# Patient Record
Sex: Male | Born: 1935 | Race: White | Hispanic: No | Marital: Married | Smoking: Never smoker
Health system: Southern US, Community
[De-identification: ages and names within clinical notes are randomized; demographics above are authoritative.]

## PROBLEM LIST (undated history)

## (undated) DIAGNOSIS — K5790 Diverticulosis of intestine, part unspecified, without perforation or abscess without bleeding: Secondary | ICD-10-CM

## (undated) DIAGNOSIS — Z860100 Personal history of colon polyps, unspecified: Secondary | ICD-10-CM

## (undated) DIAGNOSIS — M545 Low back pain, unspecified: Secondary | ICD-10-CM

## (undated) DIAGNOSIS — M21371 Foot drop, right foot: Secondary | ICD-10-CM

## (undated) DIAGNOSIS — E119 Type 2 diabetes mellitus without complications: Secondary | ICD-10-CM

## (undated) DIAGNOSIS — I10 Essential (primary) hypertension: Secondary | ICD-10-CM

## (undated) DIAGNOSIS — C9110 Chronic lymphocytic leukemia of B-cell type not having achieved remission: Secondary | ICD-10-CM

## (undated) HISTORY — DX: Low back pain: M54.5

## (undated) HISTORY — PX: EYE SURGERY: SHX253

## (undated) HISTORY — PX: CHOLECYSTECTOMY: SHX55

## (undated) HISTORY — DX: Foot drop, right foot: M21.371

## (undated) HISTORY — PX: TONSILLECTOMY: SUR1361

## (undated) HISTORY — DX: Low back pain, unspecified: M54.50

---

## 2002-05-09 ENCOUNTER — Encounter: Admission: RE | Admit: 2002-05-09 | Discharge: 2002-05-09 | Payer: Self-pay | Admitting: Family Medicine

## 2002-05-09 ENCOUNTER — Encounter: Payer: Self-pay | Admitting: Family Medicine

## 2002-06-20 ENCOUNTER — Ambulatory Visit (HOSPITAL_COMMUNITY): Admission: RE | Admit: 2002-06-20 | Discharge: 2002-06-20 | Payer: Self-pay | Admitting: Gastroenterology

## 2002-06-22 ENCOUNTER — Ambulatory Visit (HOSPITAL_COMMUNITY): Admission: RE | Admit: 2002-06-22 | Discharge: 2002-06-22 | Payer: Self-pay | Admitting: Cardiology

## 2002-08-18 ENCOUNTER — Ambulatory Visit: Admission: RE | Admit: 2002-08-18 | Discharge: 2002-08-18 | Payer: Self-pay | Admitting: Pulmonary Disease

## 2004-05-27 ENCOUNTER — Encounter: Admission: RE | Admit: 2004-05-27 | Discharge: 2004-05-27 | Payer: Self-pay | Admitting: Family Medicine

## 2004-06-18 ENCOUNTER — Observation Stay (HOSPITAL_COMMUNITY): Admission: RE | Admit: 2004-06-18 | Discharge: 2004-06-19 | Payer: Self-pay

## 2005-01-11 ENCOUNTER — Encounter: Admission: RE | Admit: 2005-01-11 | Discharge: 2005-01-11 | Payer: Self-pay | Admitting: Family Medicine

## 2005-09-17 ENCOUNTER — Ambulatory Visit (HOSPITAL_COMMUNITY): Admission: RE | Admit: 2005-09-17 | Discharge: 2005-09-17 | Payer: Self-pay | Admitting: Gastroenterology

## 2010-09-23 ENCOUNTER — Other Ambulatory Visit: Payer: Self-pay | Admitting: Gastroenterology

## 2011-02-19 ENCOUNTER — Other Ambulatory Visit: Payer: Self-pay | Admitting: Family Medicine

## 2011-02-19 DIAGNOSIS — R609 Edema, unspecified: Secondary | ICD-10-CM

## 2011-02-20 ENCOUNTER — Ambulatory Visit
Admission: RE | Admit: 2011-02-20 | Discharge: 2011-02-20 | Disposition: A | Discharge status: Home / Self Care | Payer: Medicare Other | Source: Ambulatory Visit | Attending: Family Medicine | Admitting: Family Medicine

## 2011-02-20 DIAGNOSIS — R609 Edema, unspecified: Secondary | ICD-10-CM

## 2011-08-22 DIAGNOSIS — E1129 Type 2 diabetes mellitus with other diabetic kidney complication: Secondary | ICD-10-CM | POA: Diagnosis not present

## 2011-08-22 DIAGNOSIS — E1139 Type 2 diabetes mellitus with other diabetic ophthalmic complication: Secondary | ICD-10-CM | POA: Diagnosis not present

## 2011-08-22 DIAGNOSIS — R809 Proteinuria, unspecified: Secondary | ICD-10-CM | POA: Diagnosis not present

## 2011-08-22 DIAGNOSIS — E11359 Type 2 diabetes mellitus with proliferative diabetic retinopathy without macular edema: Secondary | ICD-10-CM | POA: Diagnosis not present

## 2011-08-22 DIAGNOSIS — E669 Obesity, unspecified: Secondary | ICD-10-CM | POA: Diagnosis not present

## 2011-08-22 DIAGNOSIS — E782 Mixed hyperlipidemia: Secondary | ICD-10-CM | POA: Diagnosis not present

## 2011-08-22 DIAGNOSIS — I10 Essential (primary) hypertension: Secondary | ICD-10-CM | POA: Diagnosis not present

## 2011-09-09 DIAGNOSIS — N185 Chronic kidney disease, stage 5: Secondary | ICD-10-CM | POA: Diagnosis not present

## 2011-09-25 DIAGNOSIS — Z79899 Other long term (current) drug therapy: Secondary | ICD-10-CM | POA: Diagnosis not present

## 2011-10-16 DIAGNOSIS — E1139 Type 2 diabetes mellitus with other diabetic ophthalmic complication: Secondary | ICD-10-CM | POA: Diagnosis not present

## 2011-10-16 DIAGNOSIS — E11349 Type 2 diabetes mellitus with severe nonproliferative diabetic retinopathy without macular edema: Secondary | ICD-10-CM | POA: Diagnosis not present

## 2011-10-16 DIAGNOSIS — H35049 Retinal micro-aneurysms, unspecified, unspecified eye: Secondary | ICD-10-CM | POA: Diagnosis not present

## 2011-10-16 DIAGNOSIS — H43819 Vitreous degeneration, unspecified eye: Secondary | ICD-10-CM | POA: Diagnosis not present

## 2012-02-26 DIAGNOSIS — R609 Edema, unspecified: Secondary | ICD-10-CM | POA: Diagnosis not present

## 2012-02-26 DIAGNOSIS — I8310 Varicose veins of unspecified lower extremity with inflammation: Secondary | ICD-10-CM | POA: Diagnosis not present

## 2012-03-19 DIAGNOSIS — E1129 Type 2 diabetes mellitus with other diabetic kidney complication: Secondary | ICD-10-CM | POA: Diagnosis not present

## 2012-03-19 DIAGNOSIS — E1139 Type 2 diabetes mellitus with other diabetic ophthalmic complication: Secondary | ICD-10-CM | POA: Diagnosis not present

## 2012-03-19 DIAGNOSIS — E11359 Type 2 diabetes mellitus with proliferative diabetic retinopathy without macular edema: Secondary | ICD-10-CM | POA: Diagnosis not present

## 2012-03-19 DIAGNOSIS — E782 Mixed hyperlipidemia: Secondary | ICD-10-CM | POA: Diagnosis not present

## 2012-03-19 DIAGNOSIS — R609 Edema, unspecified: Secondary | ICD-10-CM | POA: Diagnosis not present

## 2012-03-19 DIAGNOSIS — I10 Essential (primary) hypertension: Secondary | ICD-10-CM | POA: Diagnosis not present

## 2012-03-19 DIAGNOSIS — R809 Proteinuria, unspecified: Secondary | ICD-10-CM | POA: Diagnosis not present

## 2012-03-19 DIAGNOSIS — E669 Obesity, unspecified: Secondary | ICD-10-CM | POA: Diagnosis not present

## 2012-03-19 DIAGNOSIS — E1165 Type 2 diabetes mellitus with hyperglycemia: Secondary | ICD-10-CM | POA: Diagnosis not present

## 2012-04-15 DIAGNOSIS — E1139 Type 2 diabetes mellitus with other diabetic ophthalmic complication: Secondary | ICD-10-CM | POA: Diagnosis not present

## 2012-04-15 DIAGNOSIS — E11349 Type 2 diabetes mellitus with severe nonproliferative diabetic retinopathy without macular edema: Secondary | ICD-10-CM | POA: Diagnosis not present

## 2012-04-15 DIAGNOSIS — H35049 Retinal micro-aneurysms, unspecified, unspecified eye: Secondary | ICD-10-CM | POA: Diagnosis not present

## 2012-07-16 DIAGNOSIS — Z23 Encounter for immunization: Secondary | ICD-10-CM | POA: Diagnosis not present

## 2012-09-20 DIAGNOSIS — R809 Proteinuria, unspecified: Secondary | ICD-10-CM | POA: Diagnosis not present

## 2012-09-30 ENCOUNTER — Other Ambulatory Visit: Payer: Self-pay

## 2012-10-21 DIAGNOSIS — H43819 Vitreous degeneration, unspecified eye: Secondary | ICD-10-CM | POA: Diagnosis not present

## 2012-10-21 DIAGNOSIS — E11349 Type 2 diabetes mellitus with severe nonproliferative diabetic retinopathy without macular edema: Secondary | ICD-10-CM | POA: Diagnosis not present

## 2012-10-21 DIAGNOSIS — E1139 Type 2 diabetes mellitus with other diabetic ophthalmic complication: Secondary | ICD-10-CM | POA: Diagnosis not present

## 2012-10-21 DIAGNOSIS — H35049 Retinal micro-aneurysms, unspecified, unspecified eye: Secondary | ICD-10-CM | POA: Diagnosis not present

## 2013-04-26 DIAGNOSIS — E11349 Type 2 diabetes mellitus with severe nonproliferative diabetic retinopathy without macular edema: Secondary | ICD-10-CM | POA: Diagnosis not present

## 2013-04-26 DIAGNOSIS — E1139 Type 2 diabetes mellitus with other diabetic ophthalmic complication: Secondary | ICD-10-CM | POA: Diagnosis not present

## 2013-04-26 DIAGNOSIS — H43819 Vitreous degeneration, unspecified eye: Secondary | ICD-10-CM | POA: Diagnosis not present

## 2013-04-26 DIAGNOSIS — H35359 Cystoid macular degeneration, unspecified eye: Secondary | ICD-10-CM | POA: Diagnosis not present

## 2013-05-05 DIAGNOSIS — Z23 Encounter for immunization: Secondary | ICD-10-CM | POA: Diagnosis not present

## 2013-07-27 DIAGNOSIS — H6120 Impacted cerumen, unspecified ear: Secondary | ICD-10-CM | POA: Diagnosis not present

## 2013-07-27 DIAGNOSIS — I1310 Hypertensive heart and chronic kidney disease without heart failure, with stage 1 through stage 4 chronic kidney disease, or unspecified chronic kidney disease: Secondary | ICD-10-CM | POA: Diagnosis not present

## 2013-07-27 DIAGNOSIS — M1990 Unspecified osteoarthritis, unspecified site: Secondary | ICD-10-CM | POA: Diagnosis not present

## 2013-07-27 DIAGNOSIS — Z0000 Encounter for general adult medical examination without abnormal findings: Secondary | ICD-10-CM | POA: Diagnosis not present

## 2013-07-27 DIAGNOSIS — E1129 Type 2 diabetes mellitus with other diabetic kidney complication: Secondary | ICD-10-CM | POA: Diagnosis not present

## 2013-07-27 DIAGNOSIS — I2510 Atherosclerotic heart disease of native coronary artery without angina pectoris: Secondary | ICD-10-CM | POA: Diagnosis not present

## 2013-07-27 DIAGNOSIS — E11359 Type 2 diabetes mellitus with proliferative diabetic retinopathy without macular edema: Secondary | ICD-10-CM | POA: Diagnosis not present

## 2013-07-27 DIAGNOSIS — E1139 Type 2 diabetes mellitus with other diabetic ophthalmic complication: Secondary | ICD-10-CM | POA: Diagnosis not present

## 2013-10-24 DIAGNOSIS — E1139 Type 2 diabetes mellitus with other diabetic ophthalmic complication: Secondary | ICD-10-CM | POA: Diagnosis not present

## 2013-10-24 DIAGNOSIS — E11349 Type 2 diabetes mellitus with severe nonproliferative diabetic retinopathy without macular edema: Secondary | ICD-10-CM | POA: Diagnosis not present

## 2014-02-07 DIAGNOSIS — N183 Chronic kidney disease, stage 3 unspecified: Secondary | ICD-10-CM | POA: Diagnosis not present

## 2014-02-07 DIAGNOSIS — E781 Pure hyperglyceridemia: Secondary | ICD-10-CM | POA: Diagnosis not present

## 2014-02-07 DIAGNOSIS — E1139 Type 2 diabetes mellitus with other diabetic ophthalmic complication: Secondary | ICD-10-CM | POA: Diagnosis not present

## 2014-02-07 DIAGNOSIS — E669 Obesity, unspecified: Secondary | ICD-10-CM | POA: Diagnosis not present

## 2014-02-07 DIAGNOSIS — Z23 Encounter for immunization: Secondary | ICD-10-CM | POA: Diagnosis not present

## 2014-02-07 DIAGNOSIS — I1310 Hypertensive heart and chronic kidney disease without heart failure, with stage 1 through stage 4 chronic kidney disease, or unspecified chronic kidney disease: Secondary | ICD-10-CM | POA: Diagnosis not present

## 2014-02-07 DIAGNOSIS — E11359 Type 2 diabetes mellitus with proliferative diabetic retinopathy without macular edema: Secondary | ICD-10-CM | POA: Diagnosis not present

## 2014-02-07 DIAGNOSIS — I8310 Varicose veins of unspecified lower extremity with inflammation: Secondary | ICD-10-CM | POA: Diagnosis not present

## 2014-02-07 DIAGNOSIS — E1129 Type 2 diabetes mellitus with other diabetic kidney complication: Secondary | ICD-10-CM | POA: Diagnosis not present

## 2014-02-07 DIAGNOSIS — E1165 Type 2 diabetes mellitus with hyperglycemia: Secondary | ICD-10-CM | POA: Diagnosis not present

## 2014-04-11 DIAGNOSIS — Z23 Encounter for immunization: Secondary | ICD-10-CM | POA: Diagnosis not present

## 2014-04-18 DIAGNOSIS — E11349 Type 2 diabetes mellitus with severe nonproliferative diabetic retinopathy without macular edema: Secondary | ICD-10-CM | POA: Diagnosis not present

## 2014-04-18 DIAGNOSIS — E1139 Type 2 diabetes mellitus with other diabetic ophthalmic complication: Secondary | ICD-10-CM | POA: Diagnosis not present

## 2014-04-18 DIAGNOSIS — H35359 Cystoid macular degeneration, unspecified eye: Secondary | ICD-10-CM | POA: Diagnosis not present

## 2014-04-30 DIAGNOSIS — E162 Hypoglycemia, unspecified: Secondary | ICD-10-CM | POA: Diagnosis not present

## 2014-08-09 DIAGNOSIS — I8310 Varicose veins of unspecified lower extremity with inflammation: Secondary | ICD-10-CM | POA: Diagnosis not present

## 2014-08-09 DIAGNOSIS — D649 Anemia, unspecified: Secondary | ICD-10-CM | POA: Diagnosis not present

## 2014-08-09 DIAGNOSIS — I25119 Atherosclerotic heart disease of native coronary artery with unspecified angina pectoris: Secondary | ICD-10-CM | POA: Diagnosis not present

## 2014-08-09 DIAGNOSIS — E11319 Type 2 diabetes mellitus with unspecified diabetic retinopathy without macular edema: Secondary | ICD-10-CM | POA: Diagnosis not present

## 2014-08-09 DIAGNOSIS — Z0001 Encounter for general adult medical examination with abnormal findings: Secondary | ICD-10-CM | POA: Diagnosis not present

## 2014-08-09 DIAGNOSIS — I1310 Hypertensive heart and chronic kidney disease without heart failure, with stage 1 through stage 4 chronic kidney disease, or unspecified chronic kidney disease: Secondary | ICD-10-CM | POA: Diagnosis not present

## 2014-08-09 DIAGNOSIS — E782 Mixed hyperlipidemia: Secondary | ICD-10-CM | POA: Diagnosis not present

## 2014-08-09 DIAGNOSIS — J301 Allergic rhinitis due to pollen: Secondary | ICD-10-CM | POA: Diagnosis not present

## 2014-08-09 DIAGNOSIS — Z1389 Encounter for screening for other disorder: Secondary | ICD-10-CM | POA: Diagnosis not present

## 2014-08-09 DIAGNOSIS — E1122 Type 2 diabetes mellitus with diabetic chronic kidney disease: Secondary | ICD-10-CM | POA: Diagnosis not present

## 2014-08-09 DIAGNOSIS — N183 Chronic kidney disease, stage 3 (moderate): Secondary | ICD-10-CM | POA: Diagnosis not present

## 2014-08-09 DIAGNOSIS — K219 Gastro-esophageal reflux disease without esophagitis: Secondary | ICD-10-CM | POA: Diagnosis not present

## 2015-01-16 DIAGNOSIS — E11349 Type 2 diabetes mellitus with severe nonproliferative diabetic retinopathy without macular edema: Secondary | ICD-10-CM | POA: Diagnosis not present

## 2015-01-16 DIAGNOSIS — H43813 Vitreous degeneration, bilateral: Secondary | ICD-10-CM | POA: Diagnosis not present

## 2015-02-09 DIAGNOSIS — I25119 Atherosclerotic heart disease of native coronary artery with unspecified angina pectoris: Secondary | ICD-10-CM | POA: Diagnosis not present

## 2015-02-09 DIAGNOSIS — E11319 Type 2 diabetes mellitus with unspecified diabetic retinopathy without macular edema: Secondary | ICD-10-CM | POA: Diagnosis not present

## 2015-02-09 DIAGNOSIS — E669 Obesity, unspecified: Secondary | ICD-10-CM | POA: Diagnosis not present

## 2015-02-09 DIAGNOSIS — E1122 Type 2 diabetes mellitus with diabetic chronic kidney disease: Secondary | ICD-10-CM | POA: Diagnosis not present

## 2015-02-09 DIAGNOSIS — N183 Chronic kidney disease, stage 3 (moderate): Secondary | ICD-10-CM | POA: Diagnosis not present

## 2015-02-09 DIAGNOSIS — Z6831 Body mass index (BMI) 31.0-31.9, adult: Secondary | ICD-10-CM | POA: Diagnosis not present

## 2015-02-09 DIAGNOSIS — I1310 Hypertensive heart and chronic kidney disease without heart failure, with stage 1 through stage 4 chronic kidney disease, or unspecified chronic kidney disease: Secondary | ICD-10-CM | POA: Diagnosis not present

## 2015-02-09 DIAGNOSIS — Z794 Long term (current) use of insulin: Secondary | ICD-10-CM | POA: Diagnosis not present

## 2015-02-09 DIAGNOSIS — E782 Mixed hyperlipidemia: Secondary | ICD-10-CM | POA: Diagnosis not present

## 2015-02-16 DIAGNOSIS — N179 Acute kidney failure, unspecified: Secondary | ICD-10-CM | POA: Diagnosis not present

## 2015-02-19 DIAGNOSIS — H04123 Dry eye syndrome of bilateral lacrimal glands: Secondary | ICD-10-CM | POA: Diagnosis not present

## 2015-02-19 DIAGNOSIS — H01001 Unspecified blepharitis right upper eyelid: Secondary | ICD-10-CM | POA: Diagnosis not present

## 2015-02-19 DIAGNOSIS — E11339 Type 2 diabetes mellitus with moderate nonproliferative diabetic retinopathy without macular edema: Secondary | ICD-10-CM | POA: Diagnosis not present

## 2015-02-19 DIAGNOSIS — H43813 Vitreous degeneration, bilateral: Secondary | ICD-10-CM | POA: Diagnosis not present

## 2015-02-27 DIAGNOSIS — N179 Acute kidney failure, unspecified: Secondary | ICD-10-CM | POA: Diagnosis not present

## 2015-04-09 DIAGNOSIS — Z23 Encounter for immunization: Secondary | ICD-10-CM | POA: Diagnosis not present

## 2015-09-19 DIAGNOSIS — E782 Mixed hyperlipidemia: Secondary | ICD-10-CM | POA: Diagnosis not present

## 2015-09-19 DIAGNOSIS — E669 Obesity, unspecified: Secondary | ICD-10-CM | POA: Diagnosis not present

## 2015-09-19 DIAGNOSIS — D649 Anemia, unspecified: Secondary | ICD-10-CM | POA: Diagnosis not present

## 2015-09-19 DIAGNOSIS — I1310 Hypertensive heart and chronic kidney disease without heart failure, with stage 1 through stage 4 chronic kidney disease, or unspecified chronic kidney disease: Secondary | ICD-10-CM | POA: Diagnosis not present

## 2015-09-19 DIAGNOSIS — R609 Edema, unspecified: Secondary | ICD-10-CM | POA: Diagnosis not present

## 2015-09-19 DIAGNOSIS — N183 Chronic kidney disease, stage 3 (moderate): Secondary | ICD-10-CM | POA: Diagnosis not present

## 2015-09-19 DIAGNOSIS — I25119 Atherosclerotic heart disease of native coronary artery with unspecified angina pectoris: Secondary | ICD-10-CM | POA: Diagnosis not present

## 2015-09-19 DIAGNOSIS — Z0001 Encounter for general adult medical examination with abnormal findings: Secondary | ICD-10-CM | POA: Diagnosis not present

## 2015-09-19 DIAGNOSIS — I8310 Varicose veins of unspecified lower extremity with inflammation: Secondary | ICD-10-CM | POA: Diagnosis not present

## 2015-09-19 DIAGNOSIS — E1122 Type 2 diabetes mellitus with diabetic chronic kidney disease: Secondary | ICD-10-CM | POA: Diagnosis not present

## 2015-09-19 DIAGNOSIS — K449 Diaphragmatic hernia without obstruction or gangrene: Secondary | ICD-10-CM | POA: Diagnosis not present

## 2015-09-19 DIAGNOSIS — E11319 Type 2 diabetes mellitus with unspecified diabetic retinopathy without macular edema: Secondary | ICD-10-CM | POA: Diagnosis not present

## 2015-09-26 DEATH — deceased

## 2015-10-03 DIAGNOSIS — E1122 Type 2 diabetes mellitus with diabetic chronic kidney disease: Secondary | ICD-10-CM | POA: Diagnosis not present

## 2015-10-03 DIAGNOSIS — Z7984 Long term (current) use of oral hypoglycemic drugs: Secondary | ICD-10-CM | POA: Diagnosis not present

## 2015-10-16 DIAGNOSIS — H35352 Cystoid macular degeneration, left eye: Secondary | ICD-10-CM | POA: Diagnosis not present

## 2015-10-16 DIAGNOSIS — E113391 Type 2 diabetes mellitus with moderate nonproliferative diabetic retinopathy without macular edema, right eye: Secondary | ICD-10-CM | POA: Diagnosis not present

## 2015-10-16 DIAGNOSIS — E113492 Type 2 diabetes mellitus with severe nonproliferative diabetic retinopathy without macular edema, left eye: Secondary | ICD-10-CM | POA: Diagnosis not present

## 2015-10-16 DIAGNOSIS — H35043 Retinal micro-aneurysms, unspecified, bilateral: Secondary | ICD-10-CM | POA: Diagnosis not present

## 2015-10-16 DIAGNOSIS — H3563 Retinal hemorrhage, bilateral: Secondary | ICD-10-CM | POA: Diagnosis not present

## 2015-10-16 DIAGNOSIS — H43811 Vitreous degeneration, right eye: Secondary | ICD-10-CM | POA: Diagnosis not present

## 2015-10-31 ENCOUNTER — Other Ambulatory Visit: Payer: Self-pay | Admitting: Gastroenterology

## 2015-11-01 DIAGNOSIS — Z7984 Long term (current) use of oral hypoglycemic drugs: Secondary | ICD-10-CM | POA: Diagnosis not present

## 2015-11-01 DIAGNOSIS — E1122 Type 2 diabetes mellitus with diabetic chronic kidney disease: Secondary | ICD-10-CM | POA: Diagnosis not present

## 2015-12-03 ENCOUNTER — Encounter (HOSPITAL_COMMUNITY): Payer: Self-pay | Admitting: *Deleted

## 2015-12-10 ENCOUNTER — Encounter (HOSPITAL_COMMUNITY): Payer: Self-pay | Admitting: Anesthesiology

## 2015-12-10 ENCOUNTER — Ambulatory Visit (HOSPITAL_COMMUNITY)
Admission: RE | Admit: 2015-12-10 | Discharge: 2015-12-10 | Disposition: A | Discharge status: Home / Self Care | Payer: Medicare Other | Source: Ambulatory Visit | Attending: Gastroenterology | Admitting: Gastroenterology

## 2015-12-10 ENCOUNTER — Encounter (HOSPITAL_COMMUNITY)
Admission: RE | Disposition: A | Discharge status: Home / Self Care | Payer: Self-pay | Source: Ambulatory Visit | Attending: Gastroenterology

## 2015-12-10 ENCOUNTER — Ambulatory Visit (HOSPITAL_COMMUNITY): Payer: Medicare Other | Admitting: Anesthesiology

## 2015-12-10 DIAGNOSIS — Z1211 Encounter for screening for malignant neoplasm of colon: Secondary | ICD-10-CM | POA: Insufficient documentation

## 2015-12-10 DIAGNOSIS — Z794 Long term (current) use of insulin: Secondary | ICD-10-CM | POA: Diagnosis not present

## 2015-12-10 DIAGNOSIS — I2510 Atherosclerotic heart disease of native coronary artery without angina pectoris: Secondary | ICD-10-CM | POA: Insufficient documentation

## 2015-12-10 DIAGNOSIS — E11319 Type 2 diabetes mellitus with unspecified diabetic retinopathy without macular edema: Secondary | ICD-10-CM | POA: Diagnosis not present

## 2015-12-10 DIAGNOSIS — K635 Polyp of colon: Secondary | ICD-10-CM | POA: Diagnosis not present

## 2015-12-10 DIAGNOSIS — Z86010 Personal history of colonic polyps: Secondary | ICD-10-CM | POA: Insufficient documentation

## 2015-12-10 DIAGNOSIS — D120 Benign neoplasm of cecum: Secondary | ICD-10-CM | POA: Diagnosis not present

## 2015-12-10 DIAGNOSIS — Z8719 Personal history of other diseases of the digestive system: Secondary | ICD-10-CM | POA: Diagnosis not present

## 2015-12-10 DIAGNOSIS — I10 Essential (primary) hypertension: Secondary | ICD-10-CM | POA: Insufficient documentation

## 2015-12-10 HISTORY — DX: Diverticulosis of intestine, part unspecified, without perforation or abscess without bleeding: K57.90

## 2015-12-10 HISTORY — DX: Type 2 diabetes mellitus without complications: E11.9

## 2015-12-10 HISTORY — DX: Essential (primary) hypertension: I10

## 2015-12-10 HISTORY — DX: Personal history of colonic polyps: Z86.010

## 2015-12-10 HISTORY — DX: Personal history of colon polyps, unspecified: Z86.0100

## 2015-12-10 HISTORY — PX: COLONOSCOPY WITH PROPOFOL: SHX5780

## 2015-12-10 LAB — GLUCOSE, CAPILLARY: GLUCOSE-CAPILLARY: 230 mg/dL — AB (ref 65–99)

## 2015-12-10 SURGERY — COLONOSCOPY WITH PROPOFOL
Anesthesia: Monitor Anesthesia Care

## 2015-12-10 MED ORDER — PROPOFOL 10 MG/ML IV BOLUS
INTRAVENOUS | Status: AC
  Filled 2015-12-10: qty 20

## 2015-12-10 MED ORDER — SODIUM CHLORIDE 0.9 % IV SOLN: INTRAVENOUS | Status: DC

## 2015-12-10 MED ORDER — LACTATED RINGERS IV SOLN
INTRAVENOUS | Status: DC
  Administered 2015-12-10: 08:00:00 via INTRAVENOUS
  Administered 2015-12-10: 1000 mL via INTRAVENOUS

## 2015-12-10 MED ORDER — PROPOFOL 500 MG/50ML IV EMUL
INTRAVENOUS | Status: DC | PRN
  Administered 2015-12-10: 50 mg via INTRAVENOUS

## 2015-12-10 MED ORDER — PROPOFOL 500 MG/50ML IV EMUL
INTRAVENOUS | Status: DC | PRN
  Administered 2015-12-10: 150 ug/kg/min via INTRAVENOUS

## 2015-12-10 SURGICAL SUPPLY — 21 items

## 2015-12-10 NOTE — Discharge Instructions (Signed)

## 2015-12-10 NOTE — Anesthesia Preprocedure Evaluation (Addendum)
Anesthesia Evaluation  Patient identified by MRN, date of birth, ID band Patient awake    Airway Mallampati: II  TM Distance: >3 FB Neck ROM: Full    Dental  (+) Teeth Intact   Pulmonary neg pulmonary ROS,    breath sounds clear to auscultation       Cardiovascular hypertension,  Rhythm:Regular Rate:Normal     Neuro/Psych negative neurological ROS     GI/Hepatic negative GI ROS, Neg liver ROS,   Endo/Other  diabetes, Type 1, Insulin Dependent  Renal/GU negative Renal ROS     Musculoskeletal negative musculoskeletal ROS (+)   Abdominal   Peds  Hematology   Anesthesia Other Findings   Reproductive/Obstetrics                            Anesthesia Physical Anesthesia Plan  ASA: III  Anesthesia Plan: MAC   Post-op Pain Management:    Induction: Intravenous  Airway Management Planned: Natural Airway and Simple Face Mask  Additional Equipment:   Intra-op Plan:   Post-operative Plan:   Informed Consent: I have reviewed the patients History and Physical, chart, labs and discussed the procedure including the risks, benefits and alternatives for the proposed anesthesia with the patient or authorized representative who has indicated his/her understanding and acceptance.     Plan Discussed with: CRNA and Surgeon  Anesthesia Plan Comments:         Anesthesia Quick Evaluation

## 2015-12-10 NOTE — Anesthesia Postprocedure Evaluation (Signed)
Anesthesia Post Note  Patient: Benjamin Watkins  Procedure(s) Performed: Procedure(s) (LRB): COLONOSCOPY WITH PROPOFOL (N/A)  Patient location during evaluation: Endoscopy Anesthesia Type: MAC Level of consciousness: awake and alert Pain management: pain level controlled Vital Signs Assessment: post-procedure vital signs reviewed and stable Respiratory status: spontaneous breathing, nonlabored ventilation, respiratory function stable and patient connected to nasal cannula oxygen Cardiovascular status: stable and blood pressure returned to baseline Anesthetic complications: no    Last Vitals:  Filed Vitals:   12/10/15 0855 12/10/15 0900  BP:  155/62  Pulse: 64 65  Temp:    Resp: 18 22    Last Pain: There were no vitals filed for this visit.               Kennth Vanbenschoten,JAMES TERRILL

## 2015-12-10 NOTE — H&P (Signed)
  Procedure: Surveillance colonoscopy. 09/23/2010 surveillance colonoscopy was performed with removal of three tubular adenomatous colon polyps. A 5 mm rectal polyp, a 10 mm cecal polyp, and a 5 mm cecal polyp.  History: The patient is an 82 year old male born 1933/01/20. He is scheduled to undergo a surveillance colonoscopy today.  Past medical history: Type 2 diabetes mellitus with proteinuria and diabetic retinopathy. Hypertension. Hypercholesterolemia. Gastroesophageal reflux. Aortic sclerosis. Coronary artery disease. Tonsillectomy. Cataract surgery.  Exam: The patient is alert and lying comfortably on the endoscopy stretcher. Abdomen is soft and nontender to palpation. Lungs are clear to auscultation. Cardiac exam reveals a regular rhythm.  Plan: Proceed with surveillance colonoscopy

## 2015-12-10 NOTE — Op Note (Signed)
[facility] Patient Name: Benjamin Watkins Procedure Date: 12/10/2015 MRN: [id number] Attending MD: Garlan Fair , MD Date of Birth: 06-Feb-1933 CSN: [id number] Age: 82 Admit Type: Inpatient Procedure:                Colonoscopy Indications:              High risk colon cancer surveillance: Personal                            history of adenoma (10 mm or greater in size) Providers:                Garlan Fair, MD, Vista Lawman, RN, Cherylynn Ridges, Technician, Arnoldo Hooker, CRNA Referring MD:              Medicines:                Propofol per Anesthesia Complications:            No immediate complications. Estimated Blood Loss:     Estimated blood loss: none. Procedure:                Pre-Anesthesia Assessment:                           - Prior to the procedure, a History and Physical                            was performed, and patient medications and                            allergies were reviewed. The patient's tolerance of                            previous anesthesia was also reviewed. The risks                            and benefits of the procedure and the sedation                            options and risks were discussed with the patient.                            All questions were answered, and informed consent                            was obtained. Prior Anticoagulants: The patient has                            taken aspirin, last dose was 1 day prior to                            procedure. ASA Grade Assessment: III - A patient  with severe systemic disease. After reviewing the                            risks and benefits, the patient was deemed in                            satisfactory condition to undergo the procedure.                           After obtaining informed consent, the colonoscope                            was passed under direct vision. Throughout the           procedure, the patient's blood pressure, pulse, and                            oxygen saturations were monitored continuously. The                            [id number] scope was introduced through                            the anus and advanced to the the cecum, identified                            by appendiceal orifice and ileocecal valve. The                            colonoscopy was performed without difficulty. The                            patient tolerated the procedure well. The quality                            of the bowel preparation was good. The appendiceal                            orifice and the rectum were photographed. Scope In: 8:10:09 AM Scope Out: 8:32:39 AM Scope Withdrawal Time: 0 hours 15 minutes 11 seconds  Total Procedure Duration: 0 hours 22 minutes 30 seconds  Findings:      The perianal and digital rectal examinations were normal.      A 6 mm polyp was found in the cecum. The polyp was sessile. The polyp       was removed with a cold snare. Resection and retrieval were complete.      The exam was otherwise without abnormality. Impression:               - One 6 mm polyp in the cecum, removed with a cold                            snare. Resected and retrieved.                           -  The examination was otherwise normal. Moderate Sedation:      N/A- Per Anesthesia Care Recommendation:           - Patient has a contact number available for                            emergencies. The signs and symptoms of potential                            delayed complications were discussed with the                            patient. Return to normal activities tomorrow.                            Written discharge instructions were provided to the                            patient.                           - Repeat colonoscopy is not recommended for                            surveillance.                           - Resume previous diet.                            - Continue present medications. Procedure Code(s):        --- Professional ---                           [id number], Colonoscopy, flexible; with removal of                            tumor(s), polyp(s), or other lesion(s) by snare                            technique Diagnosis Code(s):        --- Professional ---                           Z86.010, Personal history of colonic polyps                           D12.0, Benign neoplasm of cecum CPT copyright 2016 American Medical Association. All rights reserved. The codes documented in this report are preliminary and upon coder review may  be revised to meet current compliance requirements. Earle Gell, MD Garlan Fair, MD 12/10/2015 8:37:23 AM This report has been signed electronically. Number of Addenda: 0

## 2015-12-10 NOTE — Transfer of Care (Signed)
Immediate Anesthesia Transfer of Care Note  Patient: Benjamin Watkins  Procedure(s) Performed: Procedure(s): COLONOSCOPY WITH PROPOFOL (N/A)  Patient Location: PACU  Anesthesia Type:MAC  Level of Consciousness:  sedated, patient cooperative and responds to stimulation  Airway & Oxygen Therapy:Patient Spontanous Breathing and Patient connected to face mask oxgen  Post-op Assessment:  Report given to PACU RN and Post -op Vital signs reviewed and stable  Post vital signs:  Reviewed and stable  Last Vitals:  Filed Vitals:   12/10/15 0713  BP: 143/57  Pulse: 69  Temp: 36.4 C  Resp: 17    Complications: No apparent anesthesia complications

## 2015-12-11 ENCOUNTER — Encounter (HOSPITAL_COMMUNITY): Payer: Self-pay | Admitting: Gastroenterology

## 2015-12-19 DIAGNOSIS — E669 Obesity, unspecified: Secondary | ICD-10-CM | POA: Diagnosis not present

## 2015-12-19 DIAGNOSIS — I25119 Atherosclerotic heart disease of native coronary artery with unspecified angina pectoris: Secondary | ICD-10-CM | POA: Diagnosis not present

## 2015-12-19 DIAGNOSIS — I8310 Varicose veins of unspecified lower extremity with inflammation: Secondary | ICD-10-CM | POA: Diagnosis not present

## 2015-12-19 DIAGNOSIS — N183 Chronic kidney disease, stage 3 (moderate): Secondary | ICD-10-CM | POA: Diagnosis not present

## 2015-12-19 DIAGNOSIS — E1122 Type 2 diabetes mellitus with diabetic chronic kidney disease: Secondary | ICD-10-CM | POA: Diagnosis not present

## 2015-12-19 DIAGNOSIS — E11319 Type 2 diabetes mellitus with unspecified diabetic retinopathy without macular edema: Secondary | ICD-10-CM | POA: Diagnosis not present

## 2015-12-19 DIAGNOSIS — I1310 Hypertensive heart and chronic kidney disease without heart failure, with stage 1 through stage 4 chronic kidney disease, or unspecified chronic kidney disease: Secondary | ICD-10-CM | POA: Diagnosis not present

## 2015-12-19 DIAGNOSIS — Z794 Long term (current) use of insulin: Secondary | ICD-10-CM | POA: Diagnosis not present

## 2015-12-19 DIAGNOSIS — E782 Mixed hyperlipidemia: Secondary | ICD-10-CM | POA: Diagnosis not present

## 2015-12-19 DIAGNOSIS — R609 Edema, unspecified: Secondary | ICD-10-CM | POA: Diagnosis not present

## 2016-03-06 DIAGNOSIS — E113393 Type 2 diabetes mellitus with moderate nonproliferative diabetic retinopathy without macular edema, bilateral: Secondary | ICD-10-CM | POA: Diagnosis not present

## 2016-03-06 DIAGNOSIS — Z961 Presence of intraocular lens: Secondary | ICD-10-CM | POA: Diagnosis not present

## 2016-03-06 DIAGNOSIS — H01001 Unspecified blepharitis right upper eyelid: Secondary | ICD-10-CM | POA: Diagnosis not present

## 2016-03-06 DIAGNOSIS — Z0100 Encounter for examination of eyes and vision without abnormal findings: Secondary | ICD-10-CM | POA: Diagnosis not present

## 2016-04-07 DIAGNOSIS — Z23 Encounter for immunization: Secondary | ICD-10-CM | POA: Diagnosis not present

## 2016-05-22 DIAGNOSIS — E1122 Type 2 diabetes mellitus with diabetic chronic kidney disease: Secondary | ICD-10-CM | POA: Diagnosis not present

## 2016-05-22 DIAGNOSIS — R739 Hyperglycemia, unspecified: Secondary | ICD-10-CM | POA: Diagnosis not present

## 2016-05-22 DIAGNOSIS — I25119 Atherosclerotic heart disease of native coronary artery with unspecified angina pectoris: Secondary | ICD-10-CM | POA: Diagnosis not present

## 2016-05-22 DIAGNOSIS — E782 Mixed hyperlipidemia: Secondary | ICD-10-CM | POA: Diagnosis not present

## 2016-05-22 DIAGNOSIS — I1310 Hypertensive heart and chronic kidney disease without heart failure, with stage 1 through stage 4 chronic kidney disease, or unspecified chronic kidney disease: Secondary | ICD-10-CM | POA: Diagnosis not present

## 2016-05-22 DIAGNOSIS — N183 Chronic kidney disease, stage 3 (moderate): Secondary | ICD-10-CM | POA: Diagnosis not present

## 2016-05-22 DIAGNOSIS — E669 Obesity, unspecified: Secondary | ICD-10-CM | POA: Diagnosis not present

## 2016-05-22 DIAGNOSIS — R7309 Other abnormal glucose: Secondary | ICD-10-CM | POA: Diagnosis not present

## 2016-07-15 DIAGNOSIS — E113492 Type 2 diabetes mellitus with severe nonproliferative diabetic retinopathy without macular edema, left eye: Secondary | ICD-10-CM | POA: Diagnosis not present

## 2016-07-15 DIAGNOSIS — E113391 Type 2 diabetes mellitus with moderate nonproliferative diabetic retinopathy without macular edema, right eye: Secondary | ICD-10-CM | POA: Diagnosis not present

## 2016-07-15 DIAGNOSIS — H43811 Vitreous degeneration, right eye: Secondary | ICD-10-CM | POA: Diagnosis not present

## 2016-07-15 DIAGNOSIS — H35043 Retinal micro-aneurysms, unspecified, bilateral: Secondary | ICD-10-CM | POA: Diagnosis not present

## 2016-07-15 DIAGNOSIS — H35352 Cystoid macular degeneration, left eye: Secondary | ICD-10-CM | POA: Diagnosis not present

## 2016-08-21 DIAGNOSIS — E782 Mixed hyperlipidemia: Secondary | ICD-10-CM | POA: Diagnosis not present

## 2016-08-21 DIAGNOSIS — E11319 Type 2 diabetes mellitus with unspecified diabetic retinopathy without macular edema: Secondary | ICD-10-CM | POA: Diagnosis not present

## 2016-08-21 DIAGNOSIS — E669 Obesity, unspecified: Secondary | ICD-10-CM | POA: Diagnosis not present

## 2016-08-21 DIAGNOSIS — I25119 Atherosclerotic heart disease of native coronary artery with unspecified angina pectoris: Secondary | ICD-10-CM | POA: Diagnosis not present

## 2016-08-21 DIAGNOSIS — E1122 Type 2 diabetes mellitus with diabetic chronic kidney disease: Secondary | ICD-10-CM | POA: Diagnosis not present

## 2016-08-21 DIAGNOSIS — N183 Chronic kidney disease, stage 3 (moderate): Secondary | ICD-10-CM | POA: Diagnosis not present

## 2016-08-21 DIAGNOSIS — I1310 Hypertensive heart and chronic kidney disease without heart failure, with stage 1 through stage 4 chronic kidney disease, or unspecified chronic kidney disease: Secondary | ICD-10-CM | POA: Diagnosis not present

## 2016-11-20 DIAGNOSIS — R8299 Other abnormal findings in urine: Secondary | ICD-10-CM | POA: Diagnosis not present

## 2016-11-20 DIAGNOSIS — D649 Anemia, unspecified: Secondary | ICD-10-CM | POA: Diagnosis not present

## 2016-11-20 DIAGNOSIS — I1310 Hypertensive heart and chronic kidney disease without heart failure, with stage 1 through stage 4 chronic kidney disease, or unspecified chronic kidney disease: Secondary | ICD-10-CM | POA: Diagnosis not present

## 2016-11-20 DIAGNOSIS — E113299 Type 2 diabetes mellitus with mild nonproliferative diabetic retinopathy without macular edema, unspecified eye: Secondary | ICD-10-CM | POA: Diagnosis not present

## 2016-11-20 DIAGNOSIS — N183 Chronic kidney disease, stage 3 (moderate): Secondary | ICD-10-CM | POA: Diagnosis not present

## 2016-11-20 DIAGNOSIS — E11319 Type 2 diabetes mellitus with unspecified diabetic retinopathy without macular edema: Secondary | ICD-10-CM | POA: Diagnosis not present

## 2016-11-20 DIAGNOSIS — I8310 Varicose veins of unspecified lower extremity with inflammation: Secondary | ICD-10-CM | POA: Diagnosis not present

## 2016-11-20 DIAGNOSIS — Z0000 Encounter for general adult medical examination without abnormal findings: Secondary | ICD-10-CM | POA: Diagnosis not present

## 2016-11-20 DIAGNOSIS — E782 Mixed hyperlipidemia: Secondary | ICD-10-CM | POA: Diagnosis not present

## 2016-11-20 DIAGNOSIS — J301 Allergic rhinitis due to pollen: Secondary | ICD-10-CM | POA: Diagnosis not present

## 2016-11-20 DIAGNOSIS — K219 Gastro-esophageal reflux disease without esophagitis: Secondary | ICD-10-CM | POA: Diagnosis not present

## 2016-11-20 DIAGNOSIS — E669 Obesity, unspecified: Secondary | ICD-10-CM | POA: Diagnosis not present

## 2016-11-20 DIAGNOSIS — I25119 Atherosclerotic heart disease of native coronary artery with unspecified angina pectoris: Secondary | ICD-10-CM | POA: Diagnosis not present

## 2016-12-09 DIAGNOSIS — N390 Urinary tract infection, site not specified: Secondary | ICD-10-CM | POA: Diagnosis not present

## 2017-03-05 DIAGNOSIS — Z961 Presence of intraocular lens: Secondary | ICD-10-CM | POA: Diagnosis not present

## 2017-03-05 DIAGNOSIS — H10501 Unspecified blepharoconjunctivitis, right eye: Secondary | ICD-10-CM | POA: Diagnosis not present

## 2017-03-05 DIAGNOSIS — E113393 Type 2 diabetes mellitus with moderate nonproliferative diabetic retinopathy without macular edema, bilateral: Secondary | ICD-10-CM | POA: Diagnosis not present

## 2017-03-05 DIAGNOSIS — H5213 Myopia, bilateral: Secondary | ICD-10-CM | POA: Diagnosis not present

## 2017-04-14 DIAGNOSIS — H35352 Cystoid macular degeneration, left eye: Secondary | ICD-10-CM | POA: Diagnosis not present

## 2017-04-14 DIAGNOSIS — E113391 Type 2 diabetes mellitus with moderate nonproliferative diabetic retinopathy without macular edema, right eye: Secondary | ICD-10-CM | POA: Diagnosis not present

## 2017-04-14 DIAGNOSIS — E113492 Type 2 diabetes mellitus with severe nonproliferative diabetic retinopathy without macular edema, left eye: Secondary | ICD-10-CM | POA: Diagnosis not present

## 2017-04-14 DIAGNOSIS — H43811 Vitreous degeneration, right eye: Secondary | ICD-10-CM | POA: Diagnosis not present

## 2017-04-20 DIAGNOSIS — E113391 Type 2 diabetes mellitus with moderate nonproliferative diabetic retinopathy without macular edema, right eye: Secondary | ICD-10-CM | POA: Diagnosis not present

## 2017-04-20 DIAGNOSIS — E113412 Type 2 diabetes mellitus with severe nonproliferative diabetic retinopathy with macular edema, left eye: Secondary | ICD-10-CM | POA: Diagnosis not present

## 2017-04-24 DIAGNOSIS — E1122 Type 2 diabetes mellitus with diabetic chronic kidney disease: Secondary | ICD-10-CM | POA: Diagnosis not present

## 2017-04-24 DIAGNOSIS — Z794 Long term (current) use of insulin: Secondary | ICD-10-CM | POA: Diagnosis not present

## 2017-04-24 DIAGNOSIS — Z23 Encounter for immunization: Secondary | ICD-10-CM | POA: Diagnosis not present

## 2017-05-22 ENCOUNTER — Other Ambulatory Visit: Payer: Self-pay | Admitting: Family Medicine

## 2017-05-22 ENCOUNTER — Ambulatory Visit
Admission: RE | Admit: 2017-05-22 | Discharge: 2017-05-22 | Disposition: A | Discharge status: Home / Self Care | Payer: Medicare Other | Source: Ambulatory Visit | Attending: Family Medicine | Admitting: Family Medicine

## 2017-05-22 DIAGNOSIS — E782 Mixed hyperlipidemia: Secondary | ICD-10-CM | POA: Diagnosis not present

## 2017-05-22 DIAGNOSIS — I25119 Atherosclerotic heart disease of native coronary artery with unspecified angina pectoris: Secondary | ICD-10-CM | POA: Diagnosis not present

## 2017-05-22 DIAGNOSIS — D7289 Other specified disorders of white blood cells: Secondary | ICD-10-CM | POA: Diagnosis not present

## 2017-05-22 DIAGNOSIS — R05 Cough: Secondary | ICD-10-CM

## 2017-05-22 DIAGNOSIS — R059 Cough, unspecified: Secondary | ICD-10-CM

## 2017-05-22 DIAGNOSIS — E133299 Other specified diabetes mellitus with mild nonproliferative diabetic retinopathy without macular edema, unspecified eye: Secondary | ICD-10-CM | POA: Diagnosis not present

## 2017-05-22 DIAGNOSIS — I1310 Hypertensive heart and chronic kidney disease without heart failure, with stage 1 through stage 4 chronic kidney disease, or unspecified chronic kidney disease: Secondary | ICD-10-CM | POA: Diagnosis not present

## 2017-05-22 DIAGNOSIS — E781 Pure hyperglyceridemia: Secondary | ICD-10-CM | POA: Diagnosis not present

## 2017-05-22 DIAGNOSIS — E669 Obesity, unspecified: Secondary | ICD-10-CM | POA: Diagnosis not present

## 2017-05-22 DIAGNOSIS — D72829 Elevated white blood cell count, unspecified: Secondary | ICD-10-CM | POA: Diagnosis not present

## 2017-05-22 DIAGNOSIS — N183 Chronic kidney disease, stage 3 (moderate): Secondary | ICD-10-CM | POA: Diagnosis not present

## 2017-05-22 DIAGNOSIS — D649 Anemia, unspecified: Secondary | ICD-10-CM | POA: Diagnosis not present

## 2017-06-26 DIAGNOSIS — D72829 Elevated white blood cell count, unspecified: Secondary | ICD-10-CM | POA: Diagnosis not present

## 2017-07-10 ENCOUNTER — Telehealth: Payer: Self-pay | Admitting: Hematology

## 2017-07-10 NOTE — Telephone Encounter (Signed)
Called Katharine Look @ [facility] [phone]  & patient and gave them information regarding appointment date/time/location & phone number.

## 2017-07-31 NOTE — Progress Notes (Signed)
HEMATOLOGY/ONCOLOGY CONSULTATION NOTE  Date of Service: 08/03/2017  Patient Care Team: [name], MD as PCP - General (Family Medicine)  CHIEF COMPLAINTS/PURPOSE OF CONSULTATION:  Elevated WBC  HISTORY OF PRESENTING ILLNESS:   Benjamin Watkins is a wonderful 84 y.o. male who has been referred to [place] by his PCP, Dr. Brigitte Pulse from [place] for evaluation and management of elevated WBC.  Of note, Urine test on 11/20/16 show 2+ WBC in urine. Labs on 05/22/17 showed WBC at 18.7. PCP referred him with concern for a smoldering blood cancer. 06/26/17 Labs shows increase of WBC to 21.3.   He presents to the clinic today noting he was first told about elevated WBC in 03/2017. He notes he has felt normal overall, asymptomatic. He has had a few UTIs when he was told about his elevated in WBC. He notes to having a recent eye infection. He is still has residual infection and is continuing treatment.  He notes he has been slowly losing weight purposefully over the last 3 years as he has been watching his weight. He last about 30 pounds. In the past 6 months he lost 2-4 pounds. He lives with his wife and is able to get around well. He notes his age is starting to effect him but he is independent still. He is a currently retired Glass blower/designer.   In the past he was diagnosed with diabetes. He has never been a smoker and does not drink alcohol. He has not been on steroids. He has not received blood transfusion in the past nor has he had tattoos.   On review of symptoms, pt notes to eye infection symptoms. He denies change in breathing, SOB.  He denies abdominal pain, chills, night sweats, no new skin rashes and bums. He has swelling in his left ankle from an ongoing rash which is monitored by Dermatologist. He has occasional arthritis in his arms and knees with back problems at times. He reports to purposeful slow weight loss.   No abdominal pain or distension.  MEDICAL HISTORY:  Past  Medical History:  Diagnosis Date  . Diabetes mellitus without complication ([place])   . Diverticulosis   . History of colon polyps   . Hypertension     SURGICAL HISTORY: Past Surgical History:  Procedure Laterality Date  . CHOLECYSTECTOMY     laparoscopic  . COLONOSCOPY WITH PROPOFOL N/A 12/10/2015   Procedure: COLONOSCOPY WITH PROPOFOL;  Surgeon: Garlan Fair, MD;  Location: [facility] ENDOSCOPY;  Service: Endoscopy;  Laterality: N/A;  . EYE SURGERY Bilateral    bleeding retina  . TONSILLECTOMY      SOCIAL HISTORY: Social History   Socioeconomic History  . Marital status: Married    Spouse name: Not on file  . Number of children: Not on file  . Years of education: Not on file  . Highest education level: Not on file  Social Needs  . Financial resource strain: Not on file  . Food insecurity - worry: Not on file  . Food insecurity - inability: Not on file  . Transportation needs - medical: Not on file  . Transportation needs - non-medical: Not on file  Occupational History  . Not on file  Tobacco Use  . Smoking status: Never Smoker  . Smokeless tobacco: Never Used  Substance and Sexual Activity  . Alcohol use: No  . Drug use: No  . Sexual activity: Not on file  Other Topics Concern  . Not on file  Social  History Narrative  . Not on file    FAMILY HISTORY: No family history on file.  ALLERGIES:  is allergic to hydrochlorothiazide and lisinopril.  MEDICATIONS:  Current Outpatient Medications  Medication Sig Dispense Refill  . acetaminophen (TYLENOL) 325 MG tablet Take 650 mg by mouth 2 (two) times daily.    Marland Kitchen aspirin EC 81 MG tablet Take 81 mg by mouth daily.    . carvedilol (COREG) 25 MG tablet Take 25 mg by mouth 2 (two) times daily with a meal.    . chlorthalidone (HYGROTEN) 100 MG tablet Take 100 mg by mouth daily.    Marland Kitchen glimepiride (AMARYL) 2 MG tablet Take 2 mg by mouth daily with breakfast.    . hydrALAZINE (APRESOLINE) 25 MG tablet Take 25 mg by mouth 2 (two)  times daily.     . niacin 500 MG tablet Take 500 mg by mouth at bedtime.    Marland Kitchen NOVOLIN 70/30 RELION (70-30) 100 UNIT/ML injection Inject 70-74 Units into the skin 2 (two) times daily. Takes 74 units in the morning and 70 units at night.    Marland Kitchen omeprazole (PRILOSEC) 20 MG capsule Take 20 mg by mouth daily.    Vladimir Faster Glycol-Propyl Glycol (SYSTANE ULTRA OP) Place 2 drops into both eyes daily as needed (For dry eyes.).    Marland Kitchen repaglinide (PRANDIN) 0.5 MG tablet Take 0.5 mg by mouth 3 (three) times daily before meals.     No current facility-administered medications for this visit.     REVIEW OF SYSTEMS:    10 Point review of Systems was done is negative except as noted above.  PHYSICAL EXAMINATION: ECOG PERFORMANCE STATUS: 1 - Symptomatic but completely ambulatory  . Vitals:   08/03/17 1047  BP: (!) 146/62  Pulse: 67  Resp: 18  Temp: 99 F (37.2 C)  SpO2: 97%   Filed Weights   08/03/17 1047  Weight: 235 lb 4.8 oz (106.7 kg)   .Body mass index is 30.01 kg/m.  GENERAL:alert, in no acute distress and comfortable SKIN: no significant lesions (+)  scaly rash which appear like stasis dermatitis on left lower extremity, distal leg.  EYES: conjunctiva are pink and non-injected, sclera anicteric OROPHARYNX: MMM, no exudates, no oropharyngeal erythema or ulceration NECK: supple, no JVD LYMPH:  no palpable lymphadenopathy in the cervical, axillary or inguinal regions LUNGS: clear to auscultation b/l with normal respiratory effort HEART: regular rate & rhythm ABDOMEN:  normoactive bowel sounds , non tender, not distended. Extremity: no pedal edema PSYCH: alert & oriented x 3 with fluent speech NEURO: no focal motor/sensory deficits  LABORATORY DATA:  I have reviewed the data as listed  Component     Latest Ref Rng & Units 08/03/2017  WBC Count     4.0 - 10.3 K/uL 21.8 (H)  RBC     4.20 - 5.82 MIL/uL 4.85  Hemoglobin     13.0 - 17.1 g/dL 16.2  HCT     38.4 - 49.9 % 47.7   MCV     79.3 - 98.0 fL 98.4 (H)  MCH     27.2 - 33.4 pg 33.4  MCHC     32.0 - 36.0 g/dL 34.0  RDW     11.0 - 15.6 % 13.3  Platelet Count     140 - 400 K/uL 117 (L)  Smear Review      VARIANT LYMPHS AND SMUDGE CELLS  Neutrophils     % 20  NEUT#     1.5 -  6.5 K/uL 4.3  Abs Granulocyte     1.5 - 6.5 K/uL 4.3  Lymphocytes     % 71  Lymphocyte #     0.9 - 3.3 K/uL 15.6 (H)  Monocytes Relative     % 8  Monocyte #     0.1 - 0.9 K/uL 1.7 (H)  Eosinophil     % 1  Eosinophils Absolute     0.0 - 0.5 K/uL 0.2  Basophil     % 0  Basophils Absolute     0.0 - 0.1 K/uL 0.1  Sodium     136 - 145 mmol/L 137  Potassium     3.5 - 5.1 mmol/L 4.1  Chloride     98 - 109 mmol/L 102  CO2     22 - 29 mmol/L 27  Glucose     70 - 140 mg/dL 260 (H)  BUN     7 - 26 mg/dL 28 (H)  Creatinine     0.70 - 1.30 mg/dL 1.81 (H)  Calcium     8.4 - 10.4 mg/dL 9.3  Total Protein     6.4 - 8.3 g/dL 6.6  Albumin     3.5 - 5.0 g/dL 4.1  AST     5 - 34 U/L 17  ALT     0 - 55 U/L 16  Alkaline Phosphatase     40 - 150 U/L 79  Total Bilirubin     0.2 - 1.2 mg/dL 0.8  GFR, Est Non African American     >60 mL/min 33 (L)  GFR, Est African American     >60 mL/min 38 (L)  Anion gap     3 - 11 8  IgG (Immunoglobin G), Serum     700 - 1,600 mg/dL 767  IgA     61 - 437 mg/dL 133  IgM (Immunoglobulin M), Srm     15 - 143 mg/dL 16  Total Protein ELP     6.0 - 8.5 g/dL 6.3  Albumin SerPl Elph-Mcnc     2.9 - 4.4 g/dL 3.6  Alpha 1     0.0 - 0.4 g/dL 0.3  Alpha2 Glob SerPl Elph-Mcnc     0.4 - 1.0 g/dL 0.8  B-Globulin SerPl Elph-Mcnc     0.7 - 1.3 g/dL 1.0  Gamma Glob SerPl Elph-Mcnc     0.4 - 1.8 g/dL 0.6  M Protein SerPl Elph-Mcnc     Not Observed g/dL Not Observed  Globulin, Total     2.2 - 3.9 g/dL 2.7  Albumin/Glob SerPl     0.7 - 1.7 1.4  IFE 1      Comment  Please Note (HCV):      Comment  Kappa free light chain     3.3 - 19.4 mg/L 20.7 (H)  Lamda free light chains      5.7 - 26.3 mg/L 35.9 (H)  Kappa, lamda light chain ratio     0.26 - 1.65 0.58  Retic Ct Pct     0.8 - 1.8 % 1.0  RBC.     4.20 - 5.82 MIL/uL 4.85  Retic Count, Absolute     34.8 - 93.9 K/uL 48.5  LDH     125 - 245 U/L 199    Outside CBC from 11/20/16:        Outside CBC from 05/22/17:     Outside CBC from 04/24/17-06/16/17     RADIOGRAPHIC STUDIES: I have personally reviewed the  radiological images as listed and agreed with the findings in the report. No results found.   ASSESSMENT & PLAN:   EVERARD INTERRANTE is a 84 y.o. caucasian male with    1. Persistent lymphocytosis Labs shows increase of WBC to 21.3k with lymphocyte count of 15.6k. PLAN: -I discussed the possible reactive and clonal causes of lymphocytosis. His monocytes are borderline high, but the lymphocyte counts are more concerning as they have reached 16.4k in 05/2017 lab and 15.6k. -we sent out appropriate workup which included following labs. . Orders Placed This Encounter  Procedures  . CBC & Diff and Retic    Standing Status:   Future    Number of Occurrences:   1    Standing Expiration Date:   08/03/2018  . Comprehensive metabolic panel    Standing Status:   Future    Number of Occurrences:   1    Standing Expiration Date:   08/03/2018  . Lactate dehydrogenase    Standing Status:   Future    Number of Occurrences:   1    Standing Expiration Date:   08/03/2018  . Multiple Myeloma Panel (SPEP&IFE w/QIG)    Standing Status:   Future    Number of Occurrences:   1    Standing Expiration Date:   08/03/2018  . Kappa/lambda light chains    Standing Status:   Future    Number of Occurrences:   1    Standing Expiration Date:   09/07/2018  . Flow Cytometry    Chronic lymphocytosis -- r/o lymphoproliferative disorder ? CLL    Standing Status:   Future    Number of Occurrences:   1    Standing Expiration Date:   08/03/2018  . Smear    Standing Status:   Future    Number of Occurrences:   1    Standing  Expiration Date:   08/03/2018   2. Newly Diagnosed Chronic Lymphocytic Leukemia Above ordered w/u was reviewed and diagnosis of CLL was made based on PBS and flow cytometry results Likely Rai 0 Patient has thrombocytopenia PLT 117k (not <100k to count as staging criteria) No anemia or clinical splenomegaly Plan -patient to f/u as per scheduled on 08/17/2017 to discussed results in details. -will likely pursue a wait and watch approach unless he meets treatment criteria for CLL   3.  Past Medical History:  Diagnosis Date  . Diabetes mellitus without complication ([place])   . Diverticulosis   . History of colon polyps   . Hypertension   -GERD, CAD, CKD stage 3, Obesity, Hyperlipidemia  -Managed by PCP   3. Left Ankle Rash and swelling -likely stasis dermatitis. -According to pt he had a Doppler done in the past with no evidence of blood clots.  -Monitored by Dermatologist    Labs today RTC with Dr [name] in 2 weeks   All of the patients questions were answered with apparent satisfaction. The patient knows to call the clinic with any problems, questions or concerns.  I spent 45 minutes counseling the patient face to face. The total time spent in the appointment was 60 minutes and more than 50% was on counseling and direct patient cares.    Sullivan Lone MD [place] AAHIVMS [facility] Hematology/Oncology Physician [facility]  (Office):       [phone] (Work cell):  [phone] (Fax):           [phone]  08/03/2017 11:07 AM   This document serves as  a record of services personally performed by Sullivan Lone, MD. It was created on his behalf by Joslyn Devon, a trained medical scribe. The creation of this record is based on the scribe's personal observations and the provider's statements to them.   .I have reviewed the above documentation for accuracy and completeness, and I agree with the above. Brunetta Genera MD MS

## 2017-08-03 ENCOUNTER — Encounter: Payer: Self-pay | Admitting: Hematology

## 2017-08-03 ENCOUNTER — Telehealth: Payer: Self-pay | Admitting: Hematology

## 2017-08-03 ENCOUNTER — Inpatient Hospital Stay: Payer: Medicare Other

## 2017-08-03 ENCOUNTER — Inpatient Hospital Stay: Payer: Medicare Other | Attending: Hematology | Admitting: Hematology

## 2017-08-03 VITALS — BP 146/62 | HR 67 | Temp 99.0°F | Resp 18 | Ht 74.25 in | Wt 235.3 lb

## 2017-08-03 DIAGNOSIS — D72829 Elevated white blood cell count, unspecified: Secondary | ICD-10-CM | POA: Diagnosis not present

## 2017-08-03 DIAGNOSIS — D696 Thrombocytopenia, unspecified: Secondary | ICD-10-CM

## 2017-08-03 DIAGNOSIS — D72821 Monocytosis (symptomatic): Secondary | ICD-10-CM

## 2017-08-03 DIAGNOSIS — M7989 Other specified soft tissue disorders: Secondary | ICD-10-CM | POA: Diagnosis not present

## 2017-08-03 DIAGNOSIS — Z794 Long term (current) use of insulin: Secondary | ICD-10-CM | POA: Diagnosis not present

## 2017-08-03 DIAGNOSIS — Z86010 Personal history of colonic polyps: Secondary | ICD-10-CM | POA: Insufficient documentation

## 2017-08-03 DIAGNOSIS — Z7982 Long term (current) use of aspirin: Secondary | ICD-10-CM | POA: Insufficient documentation

## 2017-08-03 DIAGNOSIS — D72820 Lymphocytosis (symptomatic): Secondary | ICD-10-CM

## 2017-08-03 DIAGNOSIS — Z79899 Other long term (current) drug therapy: Secondary | ICD-10-CM | POA: Insufficient documentation

## 2017-08-03 DIAGNOSIS — C9110 Chronic lymphocytic leukemia of B-cell type not having achieved remission: Secondary | ICD-10-CM | POA: Insufficient documentation

## 2017-08-03 LAB — COMPREHENSIVE METABOLIC PANEL
ALBUMIN: 4.1 g/dL (ref 3.5–5.0)
ALT: 16 U/L (ref 0–55)
ANION GAP: 8 (ref 3–11)
AST: 17 U/L (ref 5–34)
Alkaline Phosphatase: 79 U/L (ref 40–150)
BILIRUBIN TOTAL: 0.8 mg/dL (ref 0.2–1.2)
BUN: 28 mg/dL — ABNORMAL HIGH (ref 7–26)
CHLORIDE: 102 mmol/L (ref 98–109)
CO2: 27 mmol/L (ref 22–29)
Calcium: 9.3 mg/dL (ref 8.4–10.4)
Creatinine, Ser: 1.81 mg/dL — ABNORMAL HIGH (ref 0.70–1.30)
GFR calc Af Amer: 38 mL/min — ABNORMAL LOW (ref >60)
GFR calc non Af Amer: 33 mL/min — ABNORMAL LOW (ref >60)
GLUCOSE: 260 mg/dL — AB (ref 70–140)
POTASSIUM: 4.1 mmol/L (ref 3.5–5.1)
SODIUM: 137 mmol/L (ref 136–145)
Total Protein: 6.6 g/dL (ref 6.4–8.3)

## 2017-08-03 LAB — CBC WITH DIFFERENTIAL (CANCER CENTER ONLY)
ABS GRANULOCYTE: 4.3 10*3/uL (ref 1.5–6.5)
BASOS ABS: 0.1 10*3/uL (ref 0.0–0.1)
Basophils Relative: 0 %
EOS ABS: 0.2 10*3/uL (ref 0.0–0.5)
Eosinophils Relative: 1 %
HEMATOCRIT: 47.7 % (ref 38.4–49.9)
HEMOGLOBIN: 16.2 g/dL (ref 13.0–17.1)
Lymphocytes Relative: 71 %
Lymphs Abs: 15.6 10*3/uL — ABNORMAL HIGH (ref 0.9–3.3)
MCH: 33.4 pg (ref 27.2–33.4)
MCHC: 34 g/dL (ref 32.0–36.0)
MCV: 98.4 fL — ABNORMAL HIGH (ref 79.3–98.0)
Monocytes Absolute: 1.7 10*3/uL — ABNORMAL HIGH (ref 0.1–0.9)
Monocytes Relative: 8 %
NEUTROS ABS: 4.3 10*3/uL (ref 1.5–6.5)
NEUTROS PCT: 20 %
Platelet Count: 117 10*3/uL — ABNORMAL LOW (ref 140–400)
RBC: 4.85 MIL/uL (ref 4.20–5.82)
RDW: 13.3 % (ref 11.0–15.6)
WBC Count: 21.8 10*3/uL — ABNORMAL HIGH (ref 4.0–10.3)

## 2017-08-03 LAB — RETICULOCYTES
RBC.: 4.85 MIL/uL (ref 4.20–5.82)
RETIC CT PCT: 1 % (ref 0.8–1.8)
Retic Count, Absolute: 48.5 10*3/uL (ref 34.8–93.9)

## 2017-08-03 LAB — LACTATE DEHYDROGENASE: LDH: 199 U/L (ref 125–245)

## 2017-08-03 NOTE — Telephone Encounter (Signed)
Scheduled appt per 1/7 los - Gave patient AVS and calender per los.  

## 2017-08-03 NOTE — Patient Instructions (Signed)
Thank you for choosing [facility] to provide your oncology and hematology care.  To afford each patient quality time with our providers, please arrive 30 minutes before your scheduled appointment time.  If you arrive late for your appointment, you may be asked to reschedule.  We strive to give you quality time with our providers, and arriving late affects you and other patients whose appointments are after yours.   If you are a no show for multiple scheduled visits, you may be dismissed from the clinic at the providers discretion.    Again, thank you for choosing [facility], our hope is that these requests will decrease the amount of time that you wait before being seen by our physicians.  ______________________________________________________________________  Should you have questions after your visit to the [facility], please contact our office at [phone] between the hours of 8:30 and 4:30 p.m.    Voicemails left after 4:30p.m will not be returned until the following business day.    For prescription refill requests, please have your pharmacy contact us directly.  Please also try to allow 48 hours for prescription requests.    Please contact the scheduling department for questions regarding scheduling.  For scheduling of procedures such as PET scans, CT scans, MRI, Ultrasound, etc please contact central scheduling at [phone].    Resources For Cancer Patients and Caregivers:   Oncolink.org:  A wonderful resource for patients and healthcare providers for information regarding your disease, ways to tract your treatment, what to expect, etc.     American Cancer Society:  800-227-2345  Can help patients locate various types of support and financial assistance  Cancer Care: 1-800-813-HOPE (4673) Provides financial assistance, online support groups, medication/co-pay assistance.    [place] DSS:  336-641-3447 Where to apply for food  stamps, Medicaid, and utility assistance  Medicare Rights Center: 800-333-4114 Helps people with Medicare understand their rights and benefits, navigate the Medicare system, and secure the quality healthcare they deserve  SCAT: 336-333-6589 [place] [employer]'s shared-ride transportation service for eligible riders who have a disability that prevents them from riding the fixed route bus.    For additional information on assistance programs please contact our social worker:   Grier Hock/Abigail Elmore:  [phone]            

## 2017-08-04 LAB — FLOW CYTOMETRY

## 2017-08-06 LAB — MULTIPLE MYELOMA PANEL, SERUM
ALBUMIN/GLOB SERPL: 1.4 (ref 0.7–1.7)
Albumin SerPl Elph-Mcnc: 3.6 g/dL (ref 2.9–4.4)
Alpha 1: 0.3 g/dL (ref 0.0–0.4)
Alpha2 Glob SerPl Elph-Mcnc: 0.8 g/dL (ref 0.4–1.0)
B-GLOBULIN SERPL ELPH-MCNC: 1 g/dL (ref 0.7–1.3)
GAMMA GLOB SERPL ELPH-MCNC: 0.6 g/dL (ref 0.4–1.8)
GLOBULIN, TOTAL: 2.7 g/dL (ref 2.2–3.9)
IGA: 133 mg/dL (ref 61–437)
IgG (Immunoglobin G), Serum: 767 mg/dL (ref 700–1600)
IgM (Immunoglobulin M), Srm: 16 mg/dL (ref 15–143)
Total Protein ELP: 6.3 g/dL (ref 6.0–8.5)

## 2017-08-06 LAB — KAPPA/LAMBDA LIGHT CHAINS
Kappa free light chain: 20.7 mg/L — ABNORMAL HIGH (ref 3.3–19.4)
Kappa, lambda light chain ratio: 0.58 (ref 0.26–1.65)
LAMDA FREE LIGHT CHAINS: 35.9 mg/L — AB (ref 5.7–26.3)

## 2017-08-13 NOTE — Progress Notes (Signed)
HEMATOLOGY/ONCOLOGY CLINIC NOTE  Date of Service: 08/17/2017  Patient Care Team: [name], MD as PCP - General (Family Medicine)  CHIEF COMPLAINTS/PURPOSE OF CONSULTATION:  Newly diagnosed CLL  HISTORY OF PRESENTING ILLNESS:   Benjamin Watkins is a wonderful 84 y.o. male who has been referred to [place] by his PCP, Dr. Brigitte Pulse from [place] for evaluation and management of elevated WBC.  Of note, Urine test on 11/20/16 show 2+ WBC in urine. Labs on 05/22/17 showed WBC at 18.7. PCP referred him with concern for a smoldering blood cancer. 06/26/17 Labs shows increase of WBC to 21.3.   He presents to the clinic today noting he was first told about elevated WBC in 03/2017. He notes he has felt normal overall, asymptomatic. He has had a few UTIs when he was told about his elevated in WBC. He notes to having a recent eye infection. He is still has residual infection and is continuing treatment.  He notes he has been slowly losing weight purposefully over the last 3 years as he has been watching his weight. He last about 30 pounds. In the past 6 months he lost 2-4 pounds. He lives with his wife and is able to get around well. He notes his age is starting to effect him but he is independent still. He is a currently retired Glass blower/designer.   In the past he was diagnosed with diabetes. He has never been a smoker and does not drink alcohol. He has not been on steroids. He has not received blood transfusion in the past nor has he had tattoos.   On review of symptoms, pt notes to eye infection symptoms. He denies change in breathing, SOB.  He denies abdominal pain, chills, night sweats, no new skin rashes and bums. He has swelling in his left ankle from an ongoing rash which is monitored by Dermatologist. He has occasional arthritis in his arms and knees with back problems at times. He reports to purposeful slow weight loss.    INTERVAL HISTORY:   Benjamin Watkins presents today for  follow up regarding his persistent lymphocytes that on w/u was found to be consistent with a new diagnosis of CLL . He reports that he is doing well overall.  No constitutional symptoms. No overt bumps or bumps or abdominal pain or distension.   On review of systems, he reports chronic LLE swelling. His chronic LLE swelling is due to a prior dermatologic concern. He had the area checked with a doppler that ruled ot a possible DVT. He denies abdominal pain, enlarged glands and any other symptoms.    MEDICAL HISTORY:  Past Medical History:  Diagnosis Date  . Diabetes mellitus without complication ([place])   . Diverticulosis   . History of colon polyps   . Hypertension     SURGICAL HISTORY: Past Surgical History:  Procedure Laterality Date  . CHOLECYSTECTOMY     laparoscopic  . COLONOSCOPY WITH PROPOFOL N/A 12/10/2015   Procedure: COLONOSCOPY WITH PROPOFOL;  Surgeon: Garlan Fair, MD;  Location: [facility];  Service: Endoscopy;  Laterality: N/A;  . EYE SURGERY Bilateral    bleeding retina  . TONSILLECTOMY      SOCIAL HISTORY: Social History   Socioeconomic History  . Marital status: Married    Spouse name: Not on file  . Number of children: Not on file  . Years of education: Not on file  . Highest education level: Not on file  Social Needs  .  Financial resource strain: Not on file  . Food insecurity - worry: Not on file  . Food insecurity - inability: Not on file  . Transportation needs - medical: Not on file  . Transportation needs - non-medical: Not on file  Occupational History  . Not on file  Tobacco Use  . Smoking status: Never Smoker  . Smokeless tobacco: Never Used  Substance and Sexual Activity  . Alcohol use: No  . Drug use: No  . Sexual activity: Not on file  Other Topics Concern  . Not on file  Social History Narrative  . Not on file    FAMILY HISTORY: No family history on file.  ALLERGIES:  is allergic to hydrochlorothiazide and  lisinopril.  MEDICATIONS:  Current Outpatient Medications  Medication Sig Dispense Refill  . acetaminophen (TYLENOL) 325 MG tablet Take 650 mg by mouth 2 (two) times daily.    Marland Kitchen aspirin EC 81 MG tablet Take 81 mg by mouth daily.    . carvedilol (COREG) 25 MG tablet Take 25 mg by mouth 2 (two) times daily with a meal.    . chlorthalidone (HYGROTEN) 100 MG tablet Take 100 mg by mouth daily.    Marland Kitchen glimepiride (AMARYL) 2 MG tablet Take 2 mg by mouth daily with breakfast.    . hydrALAZINE (APRESOLINE) 25 MG tablet Take 25 mg by mouth 2 (two) times daily.     . niacin 500 MG tablet Take 500 mg by mouth at bedtime.    Marland Kitchen NOVOLIN 70/30 RELION (70-30) 100 UNIT/ML injection Inject 70-74 Units into the skin 2 (two) times daily. Takes 74 units in the morning and 70 units at night.    Marland Kitchen omeprazole (PRILOSEC) 20 MG capsule Take 20 mg by mouth daily.    Vladimir Faster Glycol-Propyl Glycol (SYSTANE ULTRA OP) Place 2 drops into both eyes daily as needed (For dry eyes.).    Marland Kitchen repaglinide (PRANDIN) 0.5 MG tablet Take 0.5 mg by mouth 3 (three) times daily before meals.     No current facility-administered medications for this visit.     REVIEW OF SYSTEMS:    10 Point review of Systems was done is negative except as noted above.  PHYSICAL EXAMINATION:  ECOG PERFORMANCE STATUS: 1 - Symptomatic but completely ambulatory  . Vitals:   08/17/17 1252  BP: (!) 116/52  Pulse: 77  Resp: 18  Temp: 97.7 F (36.5 C)  SpO2: 100%   Filed Weights   08/17/17 1252  Weight: 233 lb (105.7 kg)   .Body mass index is 29.71 kg/m.  GENERAL:alert, in no acute distress and comfortable SKIN: no significant lesions (+)  scaly rash which appear like stasis dermatitis on left lower extremity, distal leg.  EYES: conjunctiva are pink and non-injected, sclera anicteric OROPHARYNX: MMM, no exudates, no oropharyngeal erythema or ulceration NECK: supple, no JVD LYMPH:  no palpable lymphadenopathy in the cervical, axillary or  inguinal regions LUNGS: clear to auscultation b/l with normal respiratory effort HEART: regular rate & rhythm ABDOMEN:  normoactive bowel sounds , non tender, not distended. No palpable splenomegaly Extremity: (+) LLE swelling chronic PSYCH: alert & oriented x 3 with fluent speech NEURO: no focal motor/sensory deficits  LABORATORY DATA:  I have reviewed the data as listed  Component     Latest Ref Rng & Units 08/03/2017  WBC Count     4.0 - 10.3 K/uL 21.8 (H)  RBC     4.20 - 5.82 MIL/uL 4.85  Hemoglobin     13.0 -  17.1 g/dL 16.2  HCT     38.4 - 49.9 % 47.7  MCV     79.3 - 98.0 fL 98.4 (H)  MCH     27.2 - 33.4 pg 33.4  MCHC     32.0 - 36.0 g/dL 34.0  RDW     11.0 - 15.6 % 13.3  Platelet Count     140 - 400 K/uL 117 (L)  Smear Review      VARIANT LYMPHS AND SMUDGE CELLS  Neutrophils     % 20  NEUT#     1.5 - 6.5 K/uL 4.3  Abs Granulocyte     1.5 - 6.5 K/uL 4.3  Lymphocytes     % 71  Lymphocyte #     0.9 - 3.3 K/uL 15.6 (H)  Monocytes Relative     % 8  Monocyte #     0.1 - 0.9 K/uL 1.7 (H)  Eosinophil     % 1  Eosinophils Absolute     0.0 - 0.5 K/uL 0.2  Basophil     % 0  Basophils Absolute     0.0 - 0.1 K/uL 0.1  Sodium     136 - 145 mmol/L 137  Potassium     3.5 - 5.1 mmol/L 4.1  Chloride     98 - 109 mmol/L 102  CO2     22 - 29 mmol/L 27  Glucose     70 - 140 mg/dL 260 (H)  BUN     7 - 26 mg/dL 28 (H)  Creatinine     0.70 - 1.30 mg/dL 1.81 (H)  Calcium     8.4 - 10.4 mg/dL 9.3  Total Protein     6.4 - 8.3 g/dL 6.6  Albumin     3.5 - 5.0 g/dL 4.1  AST     5 - 34 U/L 17  ALT     0 - 55 U/L 16  Alkaline Phosphatase     40 - 150 U/L 79  Total Bilirubin     0.2 - 1.2 mg/dL 0.8  GFR, Est Non African American     >60 mL/min 33 (L)  GFR, Est African American     >60 mL/min 38 (L)  Anion gap     3 - 11 8  IgG (Immunoglobin G), Serum     700 - 1,600 mg/dL 767  IgA     61 - 437 mg/dL 133  IgM (Immunoglobulin M), Srm     15 - 143 mg/dL  16  Total Protein ELP     6.0 - 8.5 g/dL 6.3  Albumin SerPl Elph-Mcnc     2.9 - 4.4 g/dL 3.6  Alpha 1     0.0 - 0.4 g/dL 0.3  Alpha2 Glob SerPl Elph-Mcnc     0.4 - 1.0 g/dL 0.8  B-Globulin SerPl Elph-Mcnc     0.7 - 1.3 g/dL 1.0  Gamma Glob SerPl Elph-Mcnc     0.4 - 1.8 g/dL 0.6  M Protein SerPl Elph-Mcnc     Not Observed g/dL Not Observed  Globulin, Total     2.2 - 3.9 g/dL 2.7  Albumin/Glob SerPl     0.7 - 1.7 1.4  IFE 1      Comment  Please Note (HCV):      Comment  Kappa free light chain     3.3 - 19.4 mg/L 20.7 (H)  Lamda free light chains     5.7 - 26.3 mg/L  35.9 (H)  Kappa, lamda light chain ratio     0.26 - 1.65 0.58  Retic Ct Pct     0.8 - 1.8 % 1.0  RBC.     4.20 - 5.82 MIL/uL 4.85  Retic Count, Absolute     34.8 - 93.9 K/uL 48.5  LDH     125 - 245 U/L 199    Outside CBC from 11/20/16:        Outside CBC from 05/22/17:     Outside CBC from 04/24/17-06/16/17     RADIOGRAPHIC STUDIES: I have personally reviewed the radiological images as listed and agreed with the findings in the report. No results found.   ASSESSMENT & PLAN:   Benjamin Watkins is a 84 y.o. caucasian male with    1. Newly Diagnosed Chronic Lymphocytic Leukemia Above ordered w/u was reviewed and diagnosis of CLL was made based on PBS and flow cytometry results Likely Rai 0 Patient has thrombocytopenia PLT 117k (not <100k to count as staging criteria) No anemia or clinical splenomegaly  Lab Results  Component Value Date   LDH 199 08/03/2017  Plan -I discussed in details with the patient available labs and flow cytometry (08/03/17) and discussed that the results were indicative of CLL. -we discussed the meaning of his new diagnosis of CLL, natural hx, prognosis, treatment rationale/indications. -all his questions were answered in details -Advised that we will observe the patient at this time, until the patient is symptomatic. Discussed the various concerning symptoms that  would require further treatment: fever, chills, night sweats, unexpected weight loss, enlarged lymph glands, or splenomegaly. -currently no acute indication for treating the patient CLL at this time. -Provided the patient with supplemental reading material regarding newly diagnosed CLL.   Plan -will likely pursue a wait and watch approach unless he meets treatment criteria for CLL -Follow up in 3 months with repeat labs and CLL FISH Diagnostic testing  3.  Past Medical History:  Diagnosis Date  . Diabetes mellitus without complication ([place])   . Diverticulosis   . History of colon polyps   . Hypertension   -GERD, CAD, CKD stage 3, Obesity, Hyperlipidemia  -Managed by PCP   3. Left Ankle Rash and swelling -likely stasis dermatitis. -According to pt he had a Doppler done in the past with no evidence of blood clots.  -Monitored by Dermatologist    RTC with Dr [name] in 3 months with labs   All of the patients questions were answered with apparent satisfaction. The patient knows to call the clinic with any problems, questions or concerns.  I spent 20 minutes counseling the patient face to face. The total time spent in the appointment was 25 minutes and more than 50% was on counseling and direct patient cares.    Sullivan Lone MD MS AAHIVMS [facility] Hematology/Oncology Physician [facility]  (Office):       [phone] (Work cell):  [phone] (Fax):           [phone]  08/17/2017 1:00 PM   This document serves as a record of services personally performed by Sullivan Lone, MD. It was created on his behalf by Steva Colder, a trained medical scribe. The creation of this record is based on the scribe's personal observations and the provider's statements to them.   .I have reviewed the above documentation for accuracy and completeness, and I agree with the above. Brunetta Genera MD MS

## 2017-08-17 ENCOUNTER — Inpatient Hospital Stay (HOSPITAL_BASED_OUTPATIENT_CLINIC_OR_DEPARTMENT_OTHER): Payer: Medicare Other | Admitting: Hematology

## 2017-08-17 VITALS — BP 116/52 | HR 77 | Temp 97.7°F | Resp 18 | Ht 74.25 in | Wt 233.0 lb

## 2017-08-17 DIAGNOSIS — M7989 Other specified soft tissue disorders: Secondary | ICD-10-CM

## 2017-08-17 DIAGNOSIS — H43811 Vitreous degeneration, right eye: Secondary | ICD-10-CM | POA: Diagnosis not present

## 2017-08-17 DIAGNOSIS — C9110 Chronic lymphocytic leukemia of B-cell type not having achieved remission: Secondary | ICD-10-CM

## 2017-08-17 DIAGNOSIS — H35352 Cystoid macular degeneration, left eye: Secondary | ICD-10-CM | POA: Diagnosis not present

## 2017-08-17 DIAGNOSIS — D473 Essential (hemorrhagic) thrombocythemia: Secondary | ICD-10-CM

## 2017-08-17 DIAGNOSIS — D72829 Elevated white blood cell count, unspecified: Secondary | ICD-10-CM | POA: Diagnosis not present

## 2017-08-17 DIAGNOSIS — D72820 Lymphocytosis (symptomatic): Secondary | ICD-10-CM | POA: Diagnosis not present

## 2017-08-17 DIAGNOSIS — E113412 Type 2 diabetes mellitus with severe nonproliferative diabetic retinopathy with macular edema, left eye: Secondary | ICD-10-CM | POA: Diagnosis not present

## 2017-08-17 DIAGNOSIS — E113391 Type 2 diabetes mellitus with moderate nonproliferative diabetic retinopathy without macular edema, right eye: Secondary | ICD-10-CM | POA: Diagnosis not present

## 2017-08-17 DIAGNOSIS — D696 Thrombocytopenia, unspecified: Secondary | ICD-10-CM | POA: Diagnosis not present

## 2017-08-17 DIAGNOSIS — Z794 Long term (current) use of insulin: Secondary | ICD-10-CM | POA: Diagnosis not present

## 2017-08-17 DIAGNOSIS — D75839 Thrombocytosis, unspecified: Secondary | ICD-10-CM

## 2017-08-18 ENCOUNTER — Telehealth: Payer: Self-pay | Admitting: Hematology

## 2017-08-18 NOTE — Telephone Encounter (Signed)
Per 1/21 no los °

## 2017-08-27 DIAGNOSIS — C9110 Chronic lymphocytic leukemia of B-cell type not having achieved remission: Secondary | ICD-10-CM | POA: Diagnosis not present

## 2017-08-27 DIAGNOSIS — E11319 Type 2 diabetes mellitus with unspecified diabetic retinopathy without macular edema: Secondary | ICD-10-CM | POA: Diagnosis not present

## 2017-08-27 DIAGNOSIS — Z794 Long term (current) use of insulin: Secondary | ICD-10-CM | POA: Diagnosis not present

## 2017-08-27 DIAGNOSIS — Z7984 Long term (current) use of oral hypoglycemic drugs: Secondary | ICD-10-CM | POA: Diagnosis not present

## 2017-08-27 DIAGNOSIS — I1310 Hypertensive heart and chronic kidney disease without heart failure, with stage 1 through stage 4 chronic kidney disease, or unspecified chronic kidney disease: Secondary | ICD-10-CM | POA: Diagnosis not present

## 2017-08-27 DIAGNOSIS — E782 Mixed hyperlipidemia: Secondary | ICD-10-CM | POA: Diagnosis not present

## 2017-08-27 DIAGNOSIS — N183 Chronic kidney disease, stage 3 (moderate): Secondary | ICD-10-CM | POA: Diagnosis not present

## 2017-08-27 DIAGNOSIS — I25119 Atherosclerotic heart disease of native coronary artery with unspecified angina pectoris: Secondary | ICD-10-CM | POA: Diagnosis not present

## 2017-08-27 DIAGNOSIS — K219 Gastro-esophageal reflux disease without esophagitis: Secondary | ICD-10-CM | POA: Diagnosis not present

## 2017-11-18 NOTE — Progress Notes (Signed)
HEMATOLOGY/ONCOLOGY CLINIC NOTE  Date of Service: 11/24/2017  Patient Care Team: [name], MD as PCP - General (Family Medicine)  CHIEF COMPLAINTS/PURPOSE OF CONSULTATION:  F/u for CLL  HISTORY OF PRESENTING ILLNESS:   Benjamin Watkins is a wonderful 84 y.o. male who has been referred to [place] by his PCP, Dr. Brigitte Pulse from [place] for evaluation and management of elevated WBC.  Of note, Urine test on 11/20/16 show 2+ WBC in urine. Labs on 05/22/17 showed WBC at 18.7. PCP referred him with concern for a smoldering blood cancer. 06/26/17 Labs shows increase of WBC to 21.3.   He presents to the clinic today noting he was first told about elevated WBC in 03/2017. He notes he has felt normal overall, asymptomatic. He has had a few UTIs when he was told about his elevated in WBC. He notes to having a recent eye infection. He is still has residual infection and is continuing treatment.  He notes he has been slowly losing weight purposefully over the last 3 years as he has been watching his weight. He last about 30 pounds. In the past 6 months he lost 2-4 pounds. He lives with his wife and is able to get around well. He notes his age is starting to effect him but he is independent still. He is a currently retired Glass blower/designer.   In the past he was diagnosed with diabetes. He has never been a smoker and does not drink alcohol. He has not been on steroids. He has not received blood transfusion in the past nor has he had tattoos.   On review of symptoms, pt notes to eye infection symptoms. He denies change in breathing, SOB.  He denies abdominal pain, chills, night sweats, no new skin rashes and bums. He has swelling in his left ankle from an ongoing rash which is monitored by Dermatologist. He has occasional arthritis in his arms and knees with back problems at times. He reports to purposeful slow weight loss.    INTERVAL HISTORY:   Watkins MATHEY is here for a follow up of  his CLL. He presents to the clinic today noting he has been doing well.   On review of symptoms, pt denies fever, chills or new concerning changes. He has gained some weight recently. He denies new lumps or bumps or skin rashes. He denies abdominal pain. He is eating well. He notes to having leg swelling that has not changed.    MEDICAL HISTORY:  Past Medical History:  Diagnosis Date  . Diabetes mellitus without complication ([place])   . Diverticulosis   . History of colon polyps   . Hypertension     SURGICAL HISTORY: Past Surgical History:  Procedure Laterality Date  . CHOLECYSTECTOMY     laparoscopic  . COLONOSCOPY WITH PROPOFOL N/A 12/10/2015   Procedure: COLONOSCOPY WITH PROPOFOL;  Surgeon: Garlan Fair, MD;  Location: [facility];  Service: Endoscopy;  Laterality: N/A;  . EYE SURGERY Bilateral    bleeding retina  . TONSILLECTOMY      SOCIAL HISTORY: Social History   Socioeconomic History  . Marital status: Married    Spouse name: Not on file  . Number of children: Not on file  . Years of education: Not on file  . Highest education level: Not on file  Occupational History  . Not on file  Social Needs  . Financial resource strain: Not on file  . Food insecurity:    Worry: Not on  file    Inability: Not on file  . Transportation needs:    Medical: Not on file    Non-medical: Not on file  Tobacco Use  . Smoking status: Never Smoker  . Smokeless tobacco: Never Used  Substance and Sexual Activity  . Alcohol use: No  . Drug use: No  . Sexual activity: Not on file  Lifestyle  . Physical activity:    Days per week: Not on file    Minutes per session: Not on file  . Stress: Not on file  Relationships  . Social connections:    Talks on phone: Not on file    Gets together: Not on file    Attends religious service: Not on file    Active member of club or organization: Not on file    Attends meetings of clubs or organizations: Not on file    Relationship status:  Not on file  . Intimate partner violence:    Fear of current or ex partner: Not on file    Emotionally abused: Not on file    Physically abused: Not on file    Forced sexual activity: Not on file  Other Topics Concern  . Not on file  Social History Narrative  . Not on file    FAMILY HISTORY: History reviewed. No pertinent family history.  ALLERGIES:  is allergic to hydrochlorothiazide and lisinopril.  MEDICATIONS:  Current Outpatient Medications  Medication Sig Dispense Refill  . acetaminophen (TYLENOL) 325 MG tablet Take 650 mg by mouth 2 (two) times daily.    Marland Kitchen aspirin EC 81 MG tablet Take 81 mg by mouth daily.    . carvedilol (COREG) 25 MG tablet Take 25 mg by mouth 2 (two) times daily with a meal.    . chlorthalidone (HYGROTON) 25 MG tablet Take 25 mg by mouth daily.    Marland Kitchen glimepiride (AMARYL) 2 MG tablet Take 2 mg by mouth daily with breakfast.    . hydrALAZINE (APRESOLINE) 25 MG tablet Take 25 mg by mouth 2 (two) times daily.     . niacin 500 MG tablet Take 500 mg by mouth at bedtime.    Marland Kitchen NOVOLIN 70/30 RELION (70-30) 100 UNIT/ML injection Inject 70-74 Units into the skin 2 (two) times daily. Takes 74 units in the morning and 70 units at night.    Marland Kitchen omeprazole (PRILOSEC) 20 MG capsule Take 20 mg by mouth daily.    Vladimir Faster Glycol-Propyl Glycol (SYSTANE ULTRA OP) Place 2 drops into both eyes daily as needed (For dry eyes.).     No current facility-administered medications for this visit.     REVIEW OF SYSTEMS:  .10 Point review of Systems was done is negative except as noted above.   PHYSICAL EXAMINATION:  ECOG PERFORMANCE STATUS: 1 - Symptomatic but completely ambulatory  . Vitals:   11/24/17 1337  BP: (!) 158/51  Pulse: 79  Resp: 18  Temp: 98.9 F (37.2 C)  SpO2: 97%   Filed Weights   11/24/17 1337  Weight: 256 lb 12.8 oz (116.5 kg)   .Body mass index is 32.75 kg/m. Marland Kitchen GENERAL:alert, in no acute distress and comfortable SKIN: no acute rashes, no  significant lesions EYES: conjunctiva are pink and non-injected, sclera anicteric OROPHARYNX: MMM, no exudates, no oropharyngeal erythema or ulceration NECK: supple, no JVD LYMPH:  no palpable lymphadenopathy in the cervical, axillary or inguinal regions LUNGS: clear to auscultation b/l with normal respiratory effort HEART: regular rate & rhythm ABDOMEN:  normoactive bowel sounds ,  non tender, not distended. Extremity: no pedal edema PSYCH: alert & oriented x 3 with fluent speech NEURO: no focal motor/sensory deficits  LABORATORY DATA:  I have reviewed the data as listed  . CBC Latest Ref Rng & Units 11/24/2017 08/03/2017  WBC 4.0 - 10.3 K/uL 26.9(H) 21.8(H)  Hemoglobin 13.0 - 17.1 g/dL 14.8 16.2  Hematocrit 38.4 - 49.9 % 44.6 47.7  Platelets 140 - 400 K/uL 130(L) 117(L)   . CBC    Component Value Date/Time   WBC 26.9 (H) 11/24/2017 1256   RBC 4.52 11/24/2017 1256   RBC 4.52 11/24/2017 1256   HGB 14.8 11/24/2017 1256   HCT 44.6 11/24/2017 1256   PLT 130 (L) 11/24/2017 1256   MCV 98.7 (H) 11/24/2017 1256   MCH 32.7 11/24/2017 1256   MCHC 33.2 11/24/2017 1256   RDW 13.8 11/24/2017 1256   LYMPHSABS 18.5 (H) 11/24/2017 1256   MONOABS 2.3 (H) 11/24/2017 1256   EOSABS 0.2 11/24/2017 1256   BASOSABS 0.2 (H) 11/24/2017 1256    . CMP Latest Ref Rng & Units 11/24/2017 08/03/2017  Glucose 70 - 140 mg/dL 97 260(H)  BUN 7 - 26 mg/dL 29(H) 28(H)  Creatinine 0.70 - 1.30 mg/dL 1.52(H) 1.81(H)  Sodium 136 - 145 mmol/L 141 137  Potassium 3.5 - 5.1 mmol/L 4.5 4.1  Chloride 98 - 109 mmol/L 109 102  CO2 22 - 29 mmol/L 26 27  Calcium 8.4 - 10.4 mg/dL 9.5 9.3  Total Protein 6.4 - 8.3 g/dL 6.5 6.6  Total Bilirubin 0.2 - 1.2 mg/dL 0.5 0.8  Alkaline Phos 40 - 150 U/L 107 79  AST 5 - 34 U/L 17 17  ALT 0 - 55 U/L 13 16    . Lab Results  Component Value Date   LDH 220 11/24/2017     RADIOGRAPHIC STUDIES: I have personally reviewed the radiological images as listed and agreed with the  findings in the report. No results found.   ASSESSMENT & PLAN:   ATHANASIOS HELDMAN is a 84 y.o. caucasian male with    1. Chronic Lymphocytic Leukemia The above w/u was reviewed and diagnosis of CLL was made based on PBS and flow cytometry results Likely Rai 0 Patient has thrombocytopenia PLT 117k (not <100k to count as staging criteria) No anemia or clinical splenomegaly  Lab Results  Component Value Date   LDH 199 08/03/2017  Plan  -We reviewed his Labs, Retic. at 2.0, CMP and CBC overall stable with slightly improvement in Kidney function and plt at 130K.  -Pt continues to be asymptomatic.  -His mutation studies for CLL (CLL FISH Prognostic panel) were sent today, but will not change decisions on indication for treatment at this time.  -Currently no acute indication for treating the patient CLL at this time.  -Again discussed the various concerning symptoms that would require further treatment: fever, chills, night sweats, unexpected weight loss, enlarged lymph glands, or splenomegaly. -Follow up in 4 months with repeat labs and CLL FISH Diagnostic testing. If he remains stable at next visit, will space out future follow ups to every6 months.  2.  Past Medical History:  Diagnosis Date  . Diabetes mellitus without complication ([place])   . Diverticulosis   . History of colon polyps   . Hypertension   -GERD, CAD, CKD stage 3, Obesity, Hyperlipidemia  -Managed by PCP   3. Left Ankle Rash and swelling -likely stasis dermatitis. -According to pt he had a Doppler done in the past with no  evidence of blood clots.  -Monitored by Dermatologist    Currently no acute indication for treating the patient CLL at this time RTC with Dr kale in 4 months with labs  All of the patients questions were answered with apparent satisfaction. The patient knows to call the clinic with any problems, questions or concerns.  . The total time spent in the appointment was 15 minutes and more than 50%  was on counseling and direct patient cares.     Sullivan Lone MD [place] AAHIVMS [facility] Hematology/Oncology Physician [facility]  (Office):       [phone] (Work cell):  [phone] (Fax):           [phone]  11/24/2017 1:52 PM   This document serves as a record of services personally performed by Sullivan Lone, MD. It was created on his behalf by Joslyn Devon, a trained medical scribe. The creation of this record is based on the scribe's personal observations and the provider's statements to them.    .I have reviewed the above documentation for accuracy and completeness, and I agree with the above.  Brunetta Genera MD MS

## 2017-11-24 ENCOUNTER — Encounter: Payer: Self-pay | Admitting: Hematology

## 2017-11-24 ENCOUNTER — Inpatient Hospital Stay: Payer: Medicare Other

## 2017-11-24 ENCOUNTER — Inpatient Hospital Stay: Payer: Medicare Other | Attending: Hematology | Admitting: Hematology

## 2017-11-24 ENCOUNTER — Telehealth: Payer: Self-pay | Admitting: Hematology

## 2017-11-24 VITALS — BP 158/51 | HR 79 | Temp 98.9°F | Resp 18 | Ht 74.25 in | Wt 256.8 lb

## 2017-11-24 DIAGNOSIS — Z79899 Other long term (current) drug therapy: Secondary | ICD-10-CM

## 2017-11-24 DIAGNOSIS — C9110 Chronic lymphocytic leukemia of B-cell type not having achieved remission: Secondary | ICD-10-CM

## 2017-11-24 DIAGNOSIS — D696 Thrombocytopenia, unspecified: Secondary | ICD-10-CM | POA: Insufficient documentation

## 2017-11-24 DIAGNOSIS — R634 Abnormal weight loss: Secondary | ICD-10-CM | POA: Diagnosis not present

## 2017-11-24 DIAGNOSIS — M7989 Other specified soft tissue disorders: Secondary | ICD-10-CM | POA: Insufficient documentation

## 2017-11-24 DIAGNOSIS — N183 Chronic kidney disease, stage 3 (moderate): Secondary | ICD-10-CM

## 2017-11-24 DIAGNOSIS — Z7982 Long term (current) use of aspirin: Secondary | ICD-10-CM | POA: Insufficient documentation

## 2017-11-24 DIAGNOSIS — Z794 Long term (current) use of insulin: Secondary | ICD-10-CM | POA: Diagnosis not present

## 2017-11-24 DIAGNOSIS — E1122 Type 2 diabetes mellitus with diabetic chronic kidney disease: Secondary | ICD-10-CM | POA: Diagnosis not present

## 2017-11-24 DIAGNOSIS — C9190 Lymphoid leukemia, unspecified not having achieved remission: Secondary | ICD-10-CM | POA: Diagnosis not present

## 2017-11-24 LAB — CMP (CANCER CENTER ONLY)
ALBUMIN: 3.9 g/dL (ref 3.5–5.0)
ALK PHOS: 107 U/L (ref 40–150)
ALT: 13 U/L (ref 0–55)
ANION GAP: 6 (ref 3–11)
AST: 17 U/L (ref 5–34)
BUN: 29 mg/dL — ABNORMAL HIGH (ref 7–26)
CALCIUM: 9.5 mg/dL (ref 8.4–10.4)
CHLORIDE: 109 mmol/L (ref 98–109)
CO2: 26 mmol/L (ref 22–29)
Creatinine: 1.52 mg/dL — ABNORMAL HIGH (ref 0.70–1.30)
GFR, Est AFR Am: 47 mL/min — ABNORMAL LOW (ref >60)
GFR, Estimated: 41 mL/min — ABNORMAL LOW (ref >60)
GLUCOSE: 97 mg/dL (ref 70–140)
Potassium: 4.5 mmol/L (ref 3.5–5.1)
SODIUM: 141 mmol/L (ref 136–145)
Total Bilirubin: 0.5 mg/dL (ref 0.2–1.2)
Total Protein: 6.5 g/dL (ref 6.4–8.3)

## 2017-11-24 LAB — CBC WITH DIFFERENTIAL (CANCER CENTER ONLY)
BASOS ABS: 0.2 10*3/uL — AB (ref 0.0–0.1)
Basophils Relative: 1 %
EOS ABS: 0.2 10*3/uL (ref 0.0–0.5)
Eosinophils Relative: 1 %
HEMATOCRIT: 44.6 % (ref 38.4–49.9)
HEMOGLOBIN: 14.8 g/dL (ref 13.0–17.1)
LYMPHS ABS: 18.5 10*3/uL — AB (ref 0.9–3.3)
LYMPHS PCT: 68 %
MCH: 32.7 pg (ref 27.2–33.4)
MCHC: 33.2 g/dL (ref 32.0–36.0)
MCV: 98.7 fL — AB (ref 79.3–98.0)
MONOS PCT: 9 %
Monocytes Absolute: 2.3 10*3/uL — ABNORMAL HIGH (ref 0.1–0.9)
NEUTROS PCT: 21 %
Neutro Abs: 5.7 10*3/uL (ref 1.5–6.5)
Platelet Count: 130 10*3/uL — ABNORMAL LOW (ref 140–400)
RBC: 4.52 MIL/uL (ref 4.20–5.82)
RDW: 13.8 % (ref 11.0–14.6)
WBC Count: 26.9 10*3/uL — ABNORMAL HIGH (ref 4.0–10.3)

## 2017-11-24 LAB — RETICULOCYTES
RBC.: 4.52 MIL/uL (ref 4.20–5.82)
Retic Count, Absolute: 90.4 10*3/uL (ref 34.8–93.9)
Retic Ct Pct: 2 % — ABNORMAL HIGH (ref 0.8–1.8)

## 2017-11-24 LAB — LACTATE DEHYDROGENASE: LDH: 220 U/L (ref 125–245)

## 2017-11-24 NOTE — Telephone Encounter (Signed)
Scheduled appt per 4/30 los - Gave patient aVS and calender per los.

## 2017-12-04 LAB — FISH,CLL PROGNOSTIC PANEL

## 2017-12-22 ENCOUNTER — Other Ambulatory Visit: Payer: Self-pay | Admitting: Family Medicine

## 2017-12-22 ENCOUNTER — Ambulatory Visit
Admission: RE | Admit: 2017-12-22 | Discharge: 2017-12-22 | Disposition: A | Discharge status: Home / Self Care | Payer: Medicare Other | Source: Ambulatory Visit | Attending: Family Medicine | Admitting: Family Medicine

## 2017-12-22 DIAGNOSIS — M47816 Spondylosis without myelopathy or radiculopathy, lumbar region: Secondary | ICD-10-CM | POA: Diagnosis not present

## 2017-12-22 DIAGNOSIS — E113299 Type 2 diabetes mellitus with mild nonproliferative diabetic retinopathy without macular edema, unspecified eye: Secondary | ICD-10-CM | POA: Diagnosis not present

## 2017-12-22 DIAGNOSIS — I1310 Hypertensive heart and chronic kidney disease without heart failure, with stage 1 through stage 4 chronic kidney disease, or unspecified chronic kidney disease: Secondary | ICD-10-CM | POA: Diagnosis not present

## 2017-12-22 DIAGNOSIS — Z0000 Encounter for general adult medical examination without abnormal findings: Secondary | ICD-10-CM | POA: Diagnosis not present

## 2017-12-22 DIAGNOSIS — E11319 Type 2 diabetes mellitus with unspecified diabetic retinopathy without macular edema: Secondary | ICD-10-CM | POA: Diagnosis not present

## 2017-12-22 DIAGNOSIS — M21371 Foot drop, right foot: Secondary | ICD-10-CM | POA: Diagnosis not present

## 2017-12-22 DIAGNOSIS — N183 Chronic kidney disease, stage 3 (moderate): Secondary | ICD-10-CM | POA: Diagnosis not present

## 2017-12-22 DIAGNOSIS — I25119 Atherosclerotic heart disease of native coronary artery with unspecified angina pectoris: Secondary | ICD-10-CM | POA: Diagnosis not present

## 2017-12-22 DIAGNOSIS — M549 Dorsalgia, unspecified: Secondary | ICD-10-CM | POA: Diagnosis not present

## 2017-12-22 DIAGNOSIS — C9110 Chronic lymphocytic leukemia of B-cell type not having achieved remission: Secondary | ICD-10-CM | POA: Diagnosis not present

## 2017-12-22 DIAGNOSIS — R200 Anesthesia of skin: Secondary | ICD-10-CM

## 2017-12-22 DIAGNOSIS — E782 Mixed hyperlipidemia: Secondary | ICD-10-CM | POA: Diagnosis not present

## 2017-12-22 DIAGNOSIS — D649 Anemia, unspecified: Secondary | ICD-10-CM | POA: Diagnosis not present

## 2017-12-22 DIAGNOSIS — I8310 Varicose veins of unspecified lower extremity with inflammation: Secondary | ICD-10-CM | POA: Diagnosis not present

## 2017-12-24 ENCOUNTER — Other Ambulatory Visit: Payer: Self-pay | Admitting: Family Medicine

## 2017-12-24 DIAGNOSIS — M21371 Foot drop, right foot: Secondary | ICD-10-CM

## 2017-12-27 ENCOUNTER — Ambulatory Visit
Admission: RE | Admit: 2017-12-27 | Discharge: 2017-12-27 | Disposition: A | Discharge status: Home / Self Care | Payer: Medicare Other | Source: Ambulatory Visit | Attending: Family Medicine | Admitting: Family Medicine

## 2017-12-27 DIAGNOSIS — M21371 Foot drop, right foot: Secondary | ICD-10-CM

## 2017-12-27 DIAGNOSIS — R531 Weakness: Secondary | ICD-10-CM | POA: Diagnosis not present

## 2017-12-27 MED ORDER — GADOBENATE DIMEGLUMINE 529 MG/ML IV SOLN
20.0000 mL | Freq: Once | INTRAVENOUS | Status: AC | PRN
  Administered 2017-12-27: 20 mL via INTRAVENOUS

## 2017-12-29 DIAGNOSIS — M545 Low back pain: Secondary | ICD-10-CM | POA: Diagnosis not present

## 2017-12-29 DIAGNOSIS — R262 Difficulty in walking, not elsewhere classified: Secondary | ICD-10-CM | POA: Diagnosis not present

## 2017-12-29 DIAGNOSIS — M21371 Foot drop, right foot: Secondary | ICD-10-CM | POA: Diagnosis not present

## 2017-12-29 DIAGNOSIS — M6281 Muscle weakness (generalized): Secondary | ICD-10-CM | POA: Diagnosis not present

## 2018-02-05 ENCOUNTER — Encounter (INDEPENDENT_AMBULATORY_CARE_PROVIDER_SITE_OTHER): Payer: Medicare Other | Admitting: Neurology

## 2018-02-05 ENCOUNTER — Ambulatory Visit (INDEPENDENT_AMBULATORY_CARE_PROVIDER_SITE_OTHER): Payer: Medicare Other | Admitting: Neurology

## 2018-02-05 ENCOUNTER — Telehealth: Payer: Self-pay | Admitting: Neurology

## 2018-02-05 DIAGNOSIS — M21371 Foot drop, right foot: Secondary | ICD-10-CM | POA: Diagnosis not present

## 2018-02-05 DIAGNOSIS — Z0289 Encounter for other administrative examinations: Secondary | ICD-10-CM

## 2018-02-05 DIAGNOSIS — M4807 Spinal stenosis, lumbosacral region: Secondary | ICD-10-CM

## 2018-02-05 NOTE — Procedures (Signed)
Full Name: Braydan Marriott Gender: Male MRN #: [id number] Date of Birth: 05-22-2033    Visit Date: 02/05/18 09:11 Age: 84 Years 36 Months Old Examining Physician: Marcial Pacas, MD  Referring Physician: [name], MD History: 84 years old male with history of diabetes, diabetic peripheral neuropathy, chronic low back pain, presented with worsening radiating pain to right lower extremity, right foot drop,  On examination: He has no significant proximal upper and lower extremity weakness, left lower extremity erythematous edema, right lower extremity ankle dorsiflexion 2, eversion 4, inversion 3, plantarflexion 5.  He has no significant weakness at left lower extremity.  Deep tendon reflexes were absent.  Length dependent sensory changes to bilateral knee level.  Summary of the tests:  Nerve conduction study: Right sural, superficial peroneal sensory responses were absent.  Right peroneal to EDB and tibialis anterior, right tibial motor responses were absent.  Right ulnar sensory responses showed mildly decreased to snap amplitude.  Right ulnar motor responses showed normal distal latency, with mildly decreased the C map amplitude, mildly slow conduction velocity.  Right median sensory responses were normal.  Right median motor responses were normal with exception of mildly slow conduction velocity.  Electromyography: Selective needle examinations were performed at right lower extremity muscles, right lumbosacral paraspinal muscles; and right upper extremity muscles.  There is evidence of profound active denervation, no motor unit was seen in the right tibialis anterior.  There is also evidence of chronic neuropathic changes involving right peroneal longus, right short head of biceps femoris, gluteus medius.  There is no evidence of active denervation involving the right lumbosacral paraspinal muscles.  There is evidence of chronic neuropathic changes involving right biceps,  deltoid.  Conclusion: This is an abnormal study.  There is electrodiagnostic evidence of length dependent axonal sensorimotor polyneuropathy, with superimposed right L5-S1 lumbar radiculopathy.    ------------------------------- Marcial Pacas, M.D.  [facility] [place], [place] [address] Tel: [phone] Fax: [phone]        [facility]    Nerve / Sites Muscle Latency Ref. Amplitude Ref. Rel Amp Segments Distance Velocity Ref. Area    ms ms mV mV %  cm m/s m/s mVms  R Median - APB     Wrist APB 3.1 ?4.4 6.7 ?4.0 100 Wrist - APB 7   21.0     Upper arm APB 8.4  6.0  89.7 Upper arm - Wrist 25 47 ?49 20.7  R Ulnar - ADM     Wrist ADM 3.3 ?3.3 5.2 ?6.0 100 Wrist - ADM 7   15.3     B.Elbow ADM 7.9  4.9  93.5 B.Elbow - Wrist 22 48 ?49 15.9     A.Elbow ADM 10.5  4.4  91.3 A.Elbow - B.Elbow 12 47 ?49 15.1         A.Elbow - Wrist      R Peroneal - EDB     Ankle EDB  ?6.5  ?2.0  Ankle - EDB 9        Fib head EDB NR  NR  NR Fib head - Ankle   ?44 NR         Pop fossa - Ankle      R Tibial - AH     Ankle AH NR ?5.8 NR ?4.0 NR Ankle - AH 9   NR     Pop fossa AH NR  NR  NR Pop fossa - Ankle 50 NR ?41 NR  R Peroneal - Tib Ant     Fib Head Tib Ant NR ?4.7 NR ?3.0 NR Fib Head - Tib Ant 10   NR     Pop fossa Tib Ant NR  NR  NR Pop fossa - Fib Head 10 NR ?44 NR               SNC    Nerve / Sites Rec. Site Peak Lat Ref.  Amp Ref. Segments Distance    ms ms V V  cm  R Radial - Anatomical snuff box (Forearm)     Forearm Wrist 2.9 ?2.9 7 ?15 Forearm - Wrist 10  R Sural - Ankle (Calf)     Calf Ankle NR ?4.4 NR ?6 Calf - Ankle 14  R Superficial peroneal - Ankle     Lat leg Ankle NR ?4.4 NR ?6 Lat leg - Ankle 14  R Median - Orthodromic (Dig II, Mid palm)     Dig II Wrist 3.4 ?3.4 10 ?10 Dig II - Wrist 13  R Ulnar - Orthodromic, (Dig V, Mid palm)     Dig V Wrist 3.1 ?3.1 4 ?5 Dig V - Wrist 83               F  Wave    Nerve F Lat Ref.   ms ms  R Ulnar - ADM 34.0  ?32.0       EMG full       EMG Summary Table    Spontaneous MUAP Recruitment  Muscle IA Fib PSW Fasc Other Amp Dur. Poly Pattern  R. Tibialis anterior Increased 2+ 3+ None _______ No motor units seen     R. Tibialis posterior Increased None None None _______ Normal Normal Normal Normal  R. Peroneus longus Increased 1+ None None _______ Decreased Increased 1+ Reduced  R. Gastrocnemius (Medial head) Normal None None None _______ Normal Normal Normal Normal  R. Vastus lateralis Normal None None None _______ Increased Increased Normal Reduced  R. Gluteus medius Increased None None None _______ Increased Increased Normal Reduced  R. Lumbar paraspinals (mid) Normal None None None _______ Normal Normal Normal Normal  R. Lumbar paraspinals (low) Normal None None None _______ Normal Normal Normal Normal  R. First dorsal interosseous Normal None None None _______ Normal Normal Normal Normal  R. Pronator teres Normal None None None _______ Increased Normal Normal Reduced  R. Deltoid Normal None None None _______ Normal Normal Normal Normal  R. Biceps brachii Normal None None None _______ Increased Normal Normal Reduced  R. Brachioradialis Normal None None None _______ Normal Normal Normal Normal  R. Extensor digitorum communis Normal None None None _______ Normal Normal Normal Normal  R. Biceps femoris (short head) Increased None None None _______ Increased Increased Normal Reduced

## 2018-02-05 NOTE — Telephone Encounter (Signed)
Dr. Krista Blue states she would like for patient to follow-up w/ her as a New Patient in 3-4 weeks. Called patient and LVM to schedule this.

## 2018-02-08 ENCOUNTER — Telehealth: Payer: Self-pay | Admitting: Neurology

## 2018-02-08 NOTE — Telephone Encounter (Signed)
Medicare/aarp order sent to GI. No auth they will reach out to the pt to schedule.  °

## 2018-02-09 NOTE — Telephone Encounter (Signed)
ERROR

## 2018-02-16 DIAGNOSIS — E113412 Type 2 diabetes mellitus with severe nonproliferative diabetic retinopathy with macular edema, left eye: Secondary | ICD-10-CM | POA: Diagnosis not present

## 2018-02-16 DIAGNOSIS — H35043 Retinal micro-aneurysms, unspecified, bilateral: Secondary | ICD-10-CM | POA: Diagnosis not present

## 2018-02-16 DIAGNOSIS — E113491 Type 2 diabetes mellitus with severe nonproliferative diabetic retinopathy without macular edema, right eye: Secondary | ICD-10-CM | POA: Diagnosis not present

## 2018-02-16 DIAGNOSIS — H43811 Vitreous degeneration, right eye: Secondary | ICD-10-CM | POA: Diagnosis not present

## 2018-02-17 ENCOUNTER — Ambulatory Visit
Admission: RE | Admit: 2018-02-17 | Discharge: 2018-02-17 | Disposition: A | Discharge status: Home / Self Care | Payer: Medicare Other | Source: Ambulatory Visit | Attending: Neurology | Admitting: Neurology

## 2018-02-17 DIAGNOSIS — M48061 Spinal stenosis, lumbar region without neurogenic claudication: Secondary | ICD-10-CM | POA: Diagnosis not present

## 2018-02-17 DIAGNOSIS — M4807 Spinal stenosis, lumbosacral region: Secondary | ICD-10-CM | POA: Diagnosis not present

## 2018-02-19 ENCOUNTER — Encounter: Payer: Medicare Other | Admitting: Neurology

## 2018-02-19 ENCOUNTER — Telehealth: Payer: Self-pay | Admitting: Neurology

## 2018-02-19 NOTE — Telephone Encounter (Signed)
Please call patient, MRI of the lumbar showed multilevel degenerative changes, there is variable level of foraminal and canal stenosis, with moderate to severe right L5-S1 foraminal stenosis,  I saw patient as a outside refer for EMG nerve conduction study on February 05, 2018,  move up his appointment as a new patient to my schedule for [place] to review MRI    IMPRESSION: This MRI of the lumbar spine shows multilevel degenerative changes as detailed above.  Most significant findings are: 1.    At L2-L3, there is mild spinal stenosis with left greater than right lateral recess stenosis and minimal foraminal narrowing.  There is no definite nerve root compression. 2.    At L3-L4, there is mild spinal stenosis associated with right greater than left mild foraminal narrowing and mild to moderate bilateral lateral recess stenosis.  There is no definite nerve root compression. 3.    At L4-L5, there is mild spinal stenosis and mild bilateral foraminal and mild bilateral lateral recess stenosis but no nerve root compression. 4.    At L5-S1, there is severe loss of disc height and endplate spurring causing moderately severe right and mild left foraminal narrowing and mild bilateral lateral recess stenosis.  There is potential right L5 nerve root compression.

## 2018-02-19 NOTE — Telephone Encounter (Signed)
Spoke with pt. and reviewed below EMG/NCV results.  He will f/u with his pcp to discuss results/fim

## 2018-03-05 DIAGNOSIS — E113393 Type 2 diabetes mellitus with moderate nonproliferative diabetic retinopathy without macular edema, bilateral: Secondary | ICD-10-CM | POA: Diagnosis not present

## 2018-03-05 DIAGNOSIS — H0100A Unspecified blepharitis right eye, upper and lower eyelids: Secondary | ICD-10-CM | POA: Diagnosis not present

## 2018-03-05 DIAGNOSIS — H5213 Myopia, bilateral: Secondary | ICD-10-CM | POA: Diagnosis not present

## 2018-03-05 DIAGNOSIS — H0100B Unspecified blepharitis left eye, upper and lower eyelids: Secondary | ICD-10-CM | POA: Diagnosis not present

## 2018-03-24 NOTE — Progress Notes (Signed)
HEMATOLOGY/ONCOLOGY CLINIC NOTE  Date of Service: 03/25/2018  Patient Care Team: [name], MD as PCP - General (Family Medicine)  CHIEF COMPLAINTS/PURPOSE OF CONSULTATION:  F/u for CLL  HISTORY OF PRESENTING ILLNESS:   Benjamin Watkins is a wonderful 84 y.o. male who has been referred to [place] by his PCP, Dr. Brigitte Pulse from [facility] for evaluation and management of elevated WBC.  Of note, Urine test on 11/20/16 show 2+ WBC in urine. Labs on 05/22/17 showed WBC at 18.7. PCP referred him with concern for a smoldering blood cancer. 06/26/17 Labs shows increase of WBC to 21.3.   He presents to the clinic today noting he was first told about elevated WBC in 03/2017. He notes he has felt normal overall, asymptomatic. He has had a few UTIs when he was told about his elevated in WBC. He notes to having a recent eye infection. He is still has residual infection and is continuing treatment.  He notes he has been slowly losing weight purposefully over the last 3 years as he has been watching his weight. He last about 30 pounds. In the past 6 months he lost 2-4 pounds. He lives with his wife and is able to get around well. He notes his age is starting to effect him but he is independent still. He is a currently retired Glass blower/designer.   In the past he was diagnosed with diabetes. He has never been a smoker and does not drink alcohol. He has not been on steroids. He has not received blood transfusion in the past nor has he had tattoos.   On review of symptoms, pt notes to eye infection symptoms. He denies change in breathing, SOB.  He denies abdominal pain, chills, night sweats, no new skin rashes and bums. He has swelling in his left ankle from an ongoing rash which is monitored by Dermatologist. He has occasional arthritis in his arms and knees with back problems at times. He reports to purposeful slow weight loss.    INTERVAL HISTORY:   [name] is here for management and  evaluation of his CLL. The patient's last visit with [place] was on 11/24/17. The pt reports that he is doing well overall.   The pt reports that he has no new concerns and has not developed any new constitutional symptoms in the interim. He notes that he feels the same overall as he did four months ago. He denies noticing any new lumps or bumps.  Of note since the patient's last visit, pt has had FISH CLL Prognostic Panel completed on 11/24/17 with results revealing no standard mutation.   Lab results today (03/25/18) of CBC w/diff, CMP, and Reticulocytes is as follows: all values are WNL except for WBC at 34.2k, PLT at 120k, Lymphs abs at 26.3k, Monocytes abs at 2.6k, BUN at 24, Creatinine at 1.53, Total Protein at 6.0, GFR at 40. 03/25/18 LDH is at 213  On review of systems, pt reports stable energy levels, weight gain, stable arthritic knee pain, stable ankle swelling, and denies fevers, chills, night sweats, unexpected weight loss, noticing any new lumps or bumps, mouth sores, abdominal pains, and any other symptoms.   MEDICAL HISTORY:  Past Medical History:  Diagnosis Date  . Diabetes mellitus without complication ([place])   . Diverticulosis   . History of colon polyps   . Hypertension     SURGICAL HISTORY: Past Surgical History:  Procedure Laterality Date  . CHOLECYSTECTOMY     laparoscopic  .  COLONOSCOPY WITH PROPOFOL N/A 12/10/2015   Procedure: COLONOSCOPY WITH PROPOFOL;  Surgeon: Garlan Fair, MD;  Location: [facility] ENDOSCOPY;  Service: Endoscopy;  Laterality: N/A;  . EYE SURGERY Bilateral    bleeding retina  . TONSILLECTOMY      SOCIAL HISTORY: Social History   Socioeconomic History  . Marital status: Married    Spouse name: Not on file  . Number of children: Not on file  . Years of education: Not on file  . Highest education level: Not on file  Occupational History  . Not on file  Social Needs  . Financial resource strain: Not on file  . Food insecurity:    Worry: Not on  file    Inability: Not on file  . Transportation needs:    Medical: Not on file    Non-medical: Not on file  Tobacco Use  . Smoking status: Never Smoker  . Smokeless tobacco: Never Used  Substance and Sexual Activity  . Alcohol use: No  . Drug use: No  . Sexual activity: Not on file  Lifestyle  . Physical activity:    Days per week: Not on file    Minutes per session: Not on file  . Stress: Not on file  Relationships  . Social connections:    Talks on phone: Not on file    Gets together: Not on file    Attends religious service: Not on file    Active member of club or organization: Not on file    Attends meetings of clubs or organizations: Not on file    Relationship status: Not on file  . Intimate partner violence:    Fear of current or ex partner: Not on file    Emotionally abused: Not on file    Physically abused: Not on file    Forced sexual activity: Not on file  Other Topics Concern  . Not on file  Social History Narrative  . Not on file    FAMILY HISTORY: History reviewed. No pertinent family history.  ALLERGIES:  is allergic to hydrochlorothiazide and lisinopril.  MEDICATIONS:  Current Outpatient Medications  Medication Sig Dispense Refill  . acetaminophen (TYLENOL) 325 MG tablet Take 650 mg by mouth 2 (two) times daily.    Marland Kitchen aspirin EC 81 MG tablet Take 81 mg by mouth daily.    . carvedilol (COREG) 25 MG tablet Take 25 mg by mouth 2 (two) times daily with a meal.    . chlorthalidone (HYGROTON) 25 MG tablet Take 25 mg by mouth daily.    Marland Kitchen glimepiride (AMARYL) 2 MG tablet Take 2 mg by mouth daily with breakfast.    . hydrALAZINE (APRESOLINE) 25 MG tablet Take 25 mg by mouth 2 (two) times daily.     . niacin 500 MG tablet Take 500 mg by mouth at bedtime.    Marland Kitchen NOVOLIN 70/30 RELION (70-30) 100 UNIT/ML injection Inject 70-74 Units into the skin 2 (two) times daily. Takes 74 units in the morning and 70 units at night.    Marland Kitchen omeprazole (PRILOSEC) 20 MG capsule Take  20 mg by mouth daily.    Vladimir Faster Glycol-Propyl Glycol (SYSTANE ULTRA OP) Place 2 drops into both eyes daily as needed (For dry eyes.).     No current facility-administered medications for this visit.     REVIEW OF SYSTEMS:   A 10+ POINT REVIEW OF SYSTEMS WAS OBTAINED including neurology, dermatology, psychiatry, cardiac, respiratory, lymph, extremities, GI, GU, Musculoskeletal, constitutional, breasts, reproductive, HEENT.  All pertinent positives are noted in the HPI.  All others are negative.   PHYSICAL EXAMINATION:  ECOG PERFORMANCE STATUS: 1 - Symptomatic but completely ambulatory  . Vitals:   03/25/18 1334  BP: (!) 144/63  Pulse: 75  Resp: (!) 24  Temp: 99 F (37.2 C)  SpO2: 96%   Filed Weights   03/25/18 1334  Weight: 263 lb 14.4 oz (119.7 kg)   .Body mass index is 33.65 kg/m.  GENERAL:alert, in no acute distress and comfortable SKIN: no acute rashes, no significant lesions EYES: conjunctiva are pink and non-injected, sclera anicteric OROPHARYNX: MMM, no exudates, no oropharyngeal erythema or ulceration NECK: supple, no JVD LYMPH:  no palpable lymphadenopathy in the cervical, axillary or inguinal regions LUNGS: clear to auscultation b/l with normal respiratory effort HEART: regular rate & rhythm ABDOMEN:  normoactive bowel sounds , non tender, not distended. No palpable hepatosplenomegaly.  Extremity: 2+ pedal edema, sligthly more on left PSYCH: alert & oriented x 3 with fluent speech NEURO: no focal motor/sensory deficits   LABORATORY DATA:  I have reviewed the data as listed  . CBC Latest Ref Rng & Units 03/25/2018 11/24/2017 08/03/2017  WBC 4.0 - 10.3 K/uL 34.2(H) 26.9(H) 21.8(H)  Hemoglobin 13.0 - 17.1 g/dL 14.2 14.8 16.2  Hematocrit 38.4 - 49.9 % 43.2 44.6 47.7  Platelets 140 - 400 K/uL 120(L) 130(L) 117(L)   . CBC    Component Value Date/Time   WBC 34.2 (H) 03/25/2018 1250   RBC 4.46 03/25/2018 1250   RBC 4.40 03/25/2018 1250   HGB 14.2  03/25/2018 1250   HCT 43.2 03/25/2018 1250   PLT 120 (L) 03/25/2018 1250   MCV 96.8 03/25/2018 1250   MCH 31.7 03/25/2018 1250   MCHC 32.7 03/25/2018 1250   RDW 14.4 03/25/2018 1250   LYMPHSABS 26.3 (H) 03/25/2018 1250   MONOABS 2.6 (H) 03/25/2018 1250   EOSABS 0.2 03/25/2018 1250   BASOSABS 0.1 03/25/2018 1250    . CMP Latest Ref Rng & Units 03/25/2018 11/24/2017 08/03/2017  Glucose 70 - 99 mg/dL 98 97 260(H)  BUN 8 - 23 mg/dL 24(H) 29(H) 28(H)  Creatinine 0.61 - 1.24 mg/dL 1.53(H) 1.52(H) 1.81(H)  Sodium 135 - 145 mmol/L 139 141 137  Potassium 3.5 - 5.1 mmol/L 4.4 4.5 4.1  Chloride 98 - 111 mmol/L 107 109 102  CO2 22 - 32 mmol/L 24 26 27   Calcium 8.9 - 10.3 mg/dL 9.0 9.5 9.3  Total Protein 6.5 - 8.1 g/dL 6.0(L) 6.5 6.6  Total Bilirubin 0.3 - 1.2 mg/dL 0.5 0.5 0.8  Alkaline Phos 38 - 126 U/L 93 107 79  AST 15 - 41 U/L 16 17 17   ALT 0 - 44 U/L 12 13 16     . Lab Results  Component Value Date   LDH 213 (H) 03/25/2018   11/24/17 FISH CLL Prognostic:     RADIOGRAPHIC STUDIES: I have personally reviewed the radiological images as listed and agreed with the findings in the report. No results found.   ASSESSMENT & PLAN:   GLENDELL FOUSE is a 84 y.o. caucasian male with    1. Chronic Lymphocytic Leukemia The above w/u was reviewed and diagnosis of CLL was made based on PBS and flow cytometry results Likely Rai 0 Patient has thrombocytopenia PLT 117k (not <100k to count as staging criteria) No anemia or clinical splenomegaly 11/24/17 FISH CLL Prognostic panel did not reveal a standard mutation.  Lab Results  Component Value Date   LDH 213 (H)  03/25/2018   PLAN:  -Again discussed the various concerning symptoms that would require further treatment: fever, chills, night sweats, unexpected weight loss, enlarged lymph glands, or splenomegaly. -Discussed pt labwork today, 03/25/18; WBC increased over 4 months from 26.9k to 34.2k and Lymphs increased over 4 months from  18.5k to 26.3k. Chemistries are stable. LDH is at 213. No new anemia. Stable thrombocytopenia.  -Lymphs have not doubled in at least the past 6 months -Discussed the results of the 11/24/17 FISH CLL Prognostic panel which did not reveal a standard mutation and presents indeterminate risk -Will see the pt back in 4 months, sooner if any new concerns   2.  Past Medical History:  Diagnosis Date  . Diabetes mellitus without complication ([place])   . Diverticulosis   . History of colon polyps   . Hypertension   -GERD, CAD, CKD stage 3, Obesity, Hyperlipidemia  -Managed by PCP   3. Left Ankle Rash and swelling -likely stasis dermatitis. -According to pt he had a Doppler done in the past with no evidence of blood clots.  -Monitored by Dermatologist    RTC with Dr [name] in 4 months with labs   All of the patients questions were answered with apparent satisfaction. The patient knows to call the clinic with any problems, questions or concerns.  The total time spent in the appt was 20 minutes and more than 50% was on counseling and direct patient cares.     Sullivan Lone MD MS AAHIVMS [facility] Hematology/Oncology Physician [facility]  (Office):       [phone] (Work cell):  [phone] (Fax):           [phone]  03/25/2018 1:53 PM  I, Baldwin [place], am acting as a scribe for Dr. [name]  .I have reviewed the above documentation for accuracy and completeness, and I agree with the above. Brunetta Genera MD

## 2018-03-25 ENCOUNTER — Encounter: Payer: Self-pay | Admitting: Hematology

## 2018-03-25 ENCOUNTER — Inpatient Hospital Stay: Payer: Medicare Other | Attending: Hematology | Admitting: Hematology

## 2018-03-25 ENCOUNTER — Telehealth: Payer: Self-pay | Admitting: Hematology

## 2018-03-25 ENCOUNTER — Inpatient Hospital Stay: Payer: Medicare Other

## 2018-03-25 VITALS — BP 144/63 | HR 75 | Temp 99.0°F | Resp 24 | Ht 74.25 in | Wt 263.9 lb

## 2018-03-25 DIAGNOSIS — Z7982 Long term (current) use of aspirin: Secondary | ICD-10-CM | POA: Diagnosis not present

## 2018-03-25 DIAGNOSIS — Z794 Long term (current) use of insulin: Secondary | ICD-10-CM | POA: Diagnosis not present

## 2018-03-25 DIAGNOSIS — C9110 Chronic lymphocytic leukemia of B-cell type not having achieved remission: Secondary | ICD-10-CM | POA: Diagnosis not present

## 2018-03-25 DIAGNOSIS — N183 Chronic kidney disease, stage 3 (moderate): Secondary | ICD-10-CM | POA: Insufficient documentation

## 2018-03-25 DIAGNOSIS — D696 Thrombocytopenia, unspecified: Secondary | ICD-10-CM | POA: Diagnosis not present

## 2018-03-25 DIAGNOSIS — Z79899 Other long term (current) drug therapy: Secondary | ICD-10-CM | POA: Diagnosis not present

## 2018-03-25 DIAGNOSIS — K219 Gastro-esophageal reflux disease without esophagitis: Secondary | ICD-10-CM | POA: Diagnosis not present

## 2018-03-25 DIAGNOSIS — I129 Hypertensive chronic kidney disease with stage 1 through stage 4 chronic kidney disease, or unspecified chronic kidney disease: Secondary | ICD-10-CM

## 2018-03-25 DIAGNOSIS — C9190 Lymphoid leukemia, unspecified not having achieved remission: Secondary | ICD-10-CM

## 2018-03-25 DIAGNOSIS — E1122 Type 2 diabetes mellitus with diabetic chronic kidney disease: Secondary | ICD-10-CM | POA: Insufficient documentation

## 2018-03-25 LAB — CMP (CANCER CENTER ONLY)
ALK PHOS: 93 U/L (ref 38–126)
ALT: 12 U/L (ref 0–44)
ANION GAP: 8 (ref 5–15)
AST: 16 U/L (ref 15–41)
Albumin: 3.7 g/dL (ref 3.5–5.0)
BUN: 24 mg/dL — ABNORMAL HIGH (ref 8–23)
CO2: 24 mmol/L (ref 22–32)
Calcium: 9 mg/dL (ref 8.9–10.3)
Chloride: 107 mmol/L (ref 98–111)
Creatinine: 1.53 mg/dL — ABNORMAL HIGH (ref 0.61–1.24)
GFR, EST AFRICAN AMERICAN: 47 mL/min — AB (ref >60)
GFR, Estimated: 40 mL/min — ABNORMAL LOW (ref >60)
Glucose, Bld: 98 mg/dL (ref 70–99)
Potassium: 4.4 mmol/L (ref 3.5–5.1)
SODIUM: 139 mmol/L (ref 135–145)
Total Bilirubin: 0.5 mg/dL (ref 0.3–1.2)
Total Protein: 6 g/dL — ABNORMAL LOW (ref 6.5–8.1)

## 2018-03-25 LAB — CBC WITH DIFFERENTIAL (CANCER CENTER ONLY)
Basophils Absolute: 0.1 10*3/uL (ref 0.0–0.1)
Basophils Relative: 0 %
Eosinophils Absolute: 0.2 10*3/uL (ref 0.0–0.5)
Eosinophils Relative: 1 %
HEMATOCRIT: 43.2 % (ref 38.4–49.9)
HEMOGLOBIN: 14.2 g/dL (ref 13.0–17.1)
LYMPHS ABS: 26.3 10*3/uL — AB (ref 0.9–3.3)
LYMPHS PCT: 77 %
MCH: 31.7 pg (ref 27.2–33.4)
MCHC: 32.7 g/dL (ref 32.0–36.0)
MCV: 96.8 fL (ref 79.3–98.0)
Monocytes Absolute: 2.6 10*3/uL — ABNORMAL HIGH (ref 0.1–0.9)
Monocytes Relative: 8 %
NEUTROS PCT: 14 %
Neutro Abs: 4.9 10*3/uL (ref 1.5–6.5)
Platelet Count: 120 10*3/uL — ABNORMAL LOW (ref 140–400)
RBC: 4.46 MIL/uL (ref 4.20–5.82)
RDW: 14.4 % (ref 11.0–14.6)
WBC Count: 34.2 10*3/uL — ABNORMAL HIGH (ref 4.0–10.3)

## 2018-03-25 LAB — RETICULOCYTES
RBC.: 4.4 MIL/uL (ref 4.20–5.82)
RETIC CT PCT: 1.6 % (ref 0.8–1.8)
Retic Count, Absolute: 70.4 10*3/uL (ref 34.8–93.9)

## 2018-03-25 LAB — LACTATE DEHYDROGENASE: LDH: 213 U/L — ABNORMAL HIGH (ref 98–192)

## 2018-03-25 NOTE — Telephone Encounter (Signed)
Appts scheduled AVS/Calendar printed per 8/29 los °

## 2018-04-05 ENCOUNTER — Encounter: Payer: Self-pay | Admitting: Neurology

## 2018-04-05 ENCOUNTER — Ambulatory Visit (INDEPENDENT_AMBULATORY_CARE_PROVIDER_SITE_OTHER): Payer: Medicare Other | Admitting: Neurology

## 2018-04-05 VITALS — BP 145/68 | HR 82 | Ht 74.25 in | Wt 264.0 lb

## 2018-04-05 DIAGNOSIS — M21371 Foot drop, right foot: Secondary | ICD-10-CM

## 2018-04-05 DIAGNOSIS — R269 Unspecified abnormalities of gait and mobility: Secondary | ICD-10-CM | POA: Insufficient documentation

## 2018-04-05 MED ORDER — GABAPENTIN 300 MG PO CAPS: 300.0000 mg | ORAL_CAPSULE | Freq: Three times a day (TID) | ORAL | 11 refills | Status: DC

## 2018-04-05 NOTE — Progress Notes (Signed)
PATIENT: Benjamin Watkins DOB: [birth year]  Chief Complaint  Patient presents with  . Right foot drop/low back pain    He would like to review his recent lumbar MRI.  He completed his NCV/EMG on 02/05/18.  His gait is unchanged.  Marland Kitchen PCP    [name], MD     HISTORICAL  Benjamin Watkins is a 84 year old male, seen in request by his primary care physician Dr. Brigitte Pulse, [place] for evaluation of low back pain, right foot drop, initial evaluation was on April 05, 2018.  He is accompanied by his wife at today's clinical visit.  I have reviewed and summarized the referring note from the referring physician, he has past medical history of diabetes since 1986, insulin dependent for 10 years, hypertension, chronic lymphocytic leukemia, is under close observation.  He complains of chronic low back pain, getting worse since 2017, radiating pain to right hip, also across lower back, getting worse after prolonged standing, walking, he also noticed gradual onset gait abnormality, dragging right foot, now it is hard for him to complete grocery shopping, walking on the cement floor for 30 minutes, he often felt worsening low back pain, radiating pain to right hip, he had to sit down for 10 to 15 minutes to alleviate the symptoms, he denies bowel and bladder incontinence, does has bilateral feet paresthesia, he is taking Tylenol 2 tablets twice a day for his low back pain.  EMG/NCS February 05 2018 showed evidence of length dependent axonal sensorimotor polyneuropathy, with superimposed right L5-S1 lumbar radiculopathy.  Laboratory evaluations, CMP showed elevated creatinine 1.53, CBC, elevated WBC of 34, hemoglobin of 14.2, platelet of 120  Personally reviewed MRI of lumbar in July 2019, multilevel degenerative changes, L5-S1 severe loss of disc height and endplate spurring, causing moderately severe right and mild left foraminal narrowing, and mild bilateral lateral recess stenosis, potential right L5  nerve root compression,  MRI of the brain in June 2019, significant generalized atrophy especially perisylvian fissure area, mild small vessel disease  REVIEW OF SYSTEMS: Full 14 system review of systems performed and notable only for leg swelling, back pain, muscle cramps All other review of systems were negative.  ALLERGIES: Allergies  Allergen Reactions  . Hydrochlorothiazide Other (See Comments)    "It just bothers me"  . Lisinopril Other (See Comments)    "Takes away energy"    HOME MEDICATIONS: Current Outpatient Medications  Medication Sig Dispense Refill  . acetaminophen (TYLENOL) 325 MG tablet Take 650 mg by mouth 2 (two) times daily.    Marland Kitchen aspirin EC 81 MG tablet Take 81 mg by mouth daily.    . carvedilol (COREG) 25 MG tablet Take 25 mg by mouth 2 (two) times daily with a meal.    . chlorthalidone (HYGROTON) 25 MG tablet Take 25 mg by mouth daily.    Marland Kitchen glimepiride (AMARYL) 2 MG tablet Take 2 mg by mouth daily with breakfast.    . hydrALAZINE (APRESOLINE) 25 MG tablet Take 25 mg by mouth 2 (two) times daily.     . niacin 500 MG tablet Take 500 mg by mouth at bedtime.    Marland Kitchen NOVOLIN 70/30 RELION (70-30) 100 UNIT/ML injection Inject 70-74 Units into the skin 2 (two) times daily. Takes 74 units in the morning and 70 units at night.    Marland Kitchen omeprazole (PRILOSEC) 20 MG capsule Take 20 mg by mouth daily.    Vladimir Faster Glycol-Propyl Glycol (SYSTANE ULTRA OP) Place 2 drops into both eyes  daily as needed (For dry eyes.).     No current facility-administered medications for this visit.     PAST MEDICAL HISTORY: Past Medical History:  Diagnosis Date  . Diabetes mellitus without complication ([place])   . Diverticulosis   . History of colon polyps   . Hypertension   . Low back pain   . Right foot drop     PAST SURGICAL HISTORY: Past Surgical History:  Procedure Laterality Date  . CHOLECYSTECTOMY     laparoscopic  . COLONOSCOPY WITH PROPOFOL N/A 12/10/2015   Procedure: COLONOSCOPY  WITH PROPOFOL;  Surgeon: Garlan Fair, MD;  Location: [facility];  Service: Endoscopy;  Laterality: N/A;  . EYE SURGERY Bilateral    bleeding retina  . TONSILLECTOMY      FAMILY HISTORY: Family History  Problem Relation Age of Onset  . Stroke Mother   . Diabetes Mother   . Other Father        "old age"    SOCIAL HISTORY: Social History   Socioeconomic History  . Marital status: Married    Spouse name: Not on file  . Number of children: 2  . Years of education: some college  . Highest education level: Not on file  Occupational History  . Occupation: Retired  Scientific laboratory technician  . Financial resource strain: Not on file  . Food insecurity:    Worry: Not on file    Inability: Not on file  . Transportation needs:    Medical: Not on file    Non-medical: Not on file  Tobacco Use  . Smoking status: Never Smoker  . Smokeless tobacco: Never Used  Substance and Sexual Activity  . Alcohol use: No  . Drug use: No  . Sexual activity: Not on file  Lifestyle  . Physical activity:    Days per week: Not on file    Minutes per session: Not on file  . Stress: Not on file  Relationships  . Social connections:    Talks on phone: Not on file    Gets together: Not on file    Attends religious service: Not on file    Active member of club or organization: Not on file    Attends meetings of clubs or organizations: Not on file    Relationship status: Not on file  . Intimate partner violence:    Fear of current or ex partner: Not on file    Emotionally abused: Not on file    Physically abused: Not on file    Forced sexual activity: Not on file  Other Topics Concern  . Not on file  Social History Narrative   Lives at home with his wife.   Right-handed.   0.5 cup of coffee each morning, some tea.     PHYSICAL EXAM   Vitals:   04/05/18 0741  BP: (!) 145/68  Pulse: 82  Weight: 264 lb (119.7 kg)  Height: 6' 2.25" (1.886 m)    Not recorded      Body mass index is 33.67  kg/m.  PHYSICAL EXAMNIATION:  Gen: NAD, conversant, well nourised, obese, well groomed                     Cardiovascular: Regular rate rhythm, no peripheral edema, warm, nontender. Eyes: Conjunctivae clear without exudates or hemorrhage Neck: Supple, no carotid bruits. Pulmonary: Clear to auscultation bilaterally   NEUROLOGICAL EXAM:  MENTAL STATUS: Speech:    Speech is normal; fluent and spontaneous with normal comprehension.  Cognition:     Orientation to time, place and person     Normal recent and remote memory     Normal Attention span and concentration     Normal Language, naming, repeating,spontaneous speech     Fund of knowledge   CRANIAL NERVES: CN II: Visual fields are full to confrontation. Fundoscopic exam is normal with sharp discs and no vascular changes. Pupils are round equal and briskly reactive to light. CN III, IV, VI: extraocular movement are normal. No ptosis. CN V: Facial sensation is intact to pinprick in all 3 divisions bilaterally. Corneal responses are intact.  CN VII: Face is symmetric with normal eye closure and smile. CN VIII: Hearing is normal to rubbing fingers CN IX, X: Palate elevates symmetrically. Phonation is normal. CN XI: Head turning and shoulder shrug are intact CN XII: Tongue is midline with normal movements and no atrophy.  MOTOR: He has significant right ankle dorsiflexion weakness, 3/5, mild left toe flexion tension weakness, significant bilateral lower extremity pitting edema  REFLEXES: Reflexes are 2+ and symmetric at the biceps, triceps, absent at knees, and ankles. Plantar responses are flexor.  SENSORY: Length dependent decreased vibratory sensation, light touch pinprick to mid shin level  COORDINATION: Rapid alternating movements and fine finger movements are intact. There is no dysmetria on finger-to-nose and heel-knee-shin.    GAIT/STANCE: Needs to push up to get up from seated position, right foot drop, could stand up  right tiptoe, not on right heel   DIAGNOSTIC DATA (LABS, IMAGING, TESTING) - I reviewed patient records, labs, notes, testing and imaging myself where available.   ASSESSMENT AND PLAN  VALDIS BEVILL is a 84 y.o. male   Gait abnormality, right foot drop Peripheral neuropathy with superimposed right lumbosacral radiculopathy  Laboratory evaluation for potential etiology of peripheral neuropathy, most likely due to his long term diabetes  Right ankle brace  Gabapentin 300 mg 3 times daily as needed   Marcial Pacas, M.D. Ph.D.  [facility] [address] [place], [place] [address] Ph: [phone] Fax: [phone]  CC: [name], MD

## 2018-04-05 NOTE — Patient Instructions (Signed)
[facility]  Orthotics & Prosthetics Service  [address]  [phone]  

## 2018-04-06 ENCOUNTER — Telehealth: Payer: Self-pay | Admitting: Neurology

## 2018-04-06 LAB — SEDIMENTATION RATE: Sed Rate: 3 mm/hr (ref 0–30)

## 2018-04-06 LAB — ANA W/REFLEX IF POSITIVE
ANA: POSITIVE — AB
Anti JO-1: <0.2 AI (ref 0.0–0.9)
ENA RNP Ab: 0.3 AI (ref 0.0–0.9)
ENA SM Ab Ser-aCnc: <0.2 AI (ref 0.0–0.9)
ENA SSA (RO) Ab: <0.2 AI (ref 0.0–0.9)
Scleroderma SCL-70: 1.6 AI — ABNORMAL HIGH (ref 0.0–0.9)
dsDNA Ab: <1 IU/mL (ref 0–9)

## 2018-04-06 LAB — VITAMIN B12: Vitamin B-12: 208 pg/mL — ABNORMAL LOW (ref 232–1245)

## 2018-04-06 LAB — HEMOGLOBIN A1C
ESTIMATED AVERAGE GLUCOSE: 137 mg/dL
HEMOGLOBIN A1C: 6.4 % — AB (ref 4.8–5.6)

## 2018-04-06 LAB — C-REACTIVE PROTEIN: CRP: 1 mg/L (ref 0–10)

## 2018-04-06 LAB — TSH: TSH: 1.45 u[IU]/mL (ref 0.450–4.500)

## 2018-04-06 LAB — RPR: RPR: NONREACTIVE

## 2018-04-06 NOTE — Telephone Encounter (Signed)
Spoke to patient - he is aware of his lab results.

## 2018-04-06 NOTE — Telephone Encounter (Signed)
Please call patient, laboratory evaluation showed mild elevated A1c 6.4, positive ANA, scleroderma antibody, which has unknown clinical significance,  rest of the laboratory evaluation TSH C-reactive protein ESR, B12, was normal,

## 2018-04-29 ENCOUNTER — Ambulatory Visit: Payer: Medicare Other | Admitting: Neurology

## 2018-04-29 ENCOUNTER — Encounter

## 2018-06-14 ENCOUNTER — Telehealth: Payer: Self-pay | Admitting: Hematology

## 2018-06-14 NOTE — Telephone Encounter (Signed)
[facility] 12/31 moved appointments to 1/3. Left message for patient. Schedule mailed.

## 2018-07-02 DIAGNOSIS — E1122 Type 2 diabetes mellitus with diabetic chronic kidney disease: Secondary | ICD-10-CM | POA: Diagnosis not present

## 2018-07-02 DIAGNOSIS — C9110 Chronic lymphocytic leukemia of B-cell type not having achieved remission: Secondary | ICD-10-CM | POA: Diagnosis not present

## 2018-07-02 DIAGNOSIS — Z23 Encounter for immunization: Secondary | ICD-10-CM | POA: Diagnosis not present

## 2018-07-02 DIAGNOSIS — Z7984 Long term (current) use of oral hypoglycemic drugs: Secondary | ICD-10-CM | POA: Diagnosis not present

## 2018-07-02 DIAGNOSIS — M21371 Foot drop, right foot: Secondary | ICD-10-CM | POA: Diagnosis not present

## 2018-07-02 DIAGNOSIS — I25119 Atherosclerotic heart disease of native coronary artery with unspecified angina pectoris: Secondary | ICD-10-CM | POA: Diagnosis not present

## 2018-07-02 DIAGNOSIS — I1310 Hypertensive heart and chronic kidney disease without heart failure, with stage 1 through stage 4 chronic kidney disease, or unspecified chronic kidney disease: Secondary | ICD-10-CM | POA: Diagnosis not present

## 2018-07-02 DIAGNOSIS — E782 Mixed hyperlipidemia: Secondary | ICD-10-CM | POA: Diagnosis not present

## 2018-07-02 DIAGNOSIS — N183 Chronic kidney disease, stage 3 (moderate): Secondary | ICD-10-CM | POA: Diagnosis not present

## 2018-07-06 DIAGNOSIS — M21371 Foot drop, right foot: Secondary | ICD-10-CM | POA: Diagnosis not present

## 2018-07-06 DIAGNOSIS — Z6833 Body mass index (BMI) 33.0-33.9, adult: Secondary | ICD-10-CM | POA: Diagnosis not present

## 2018-07-06 DIAGNOSIS — R030 Elevated blood-pressure reading, without diagnosis of hypertension: Secondary | ICD-10-CM | POA: Diagnosis not present

## 2018-07-12 DIAGNOSIS — N179 Acute kidney failure, unspecified: Secondary | ICD-10-CM | POA: Diagnosis not present

## 2018-07-22 DIAGNOSIS — G5731 Lesion of lateral popliteal nerve, right lower limb: Secondary | ICD-10-CM | POA: Diagnosis not present

## 2018-07-26 ENCOUNTER — Other Ambulatory Visit: Payer: Medicare Other

## 2018-07-26 ENCOUNTER — Ambulatory Visit: Payer: Medicare Other | Admitting: Hematology

## 2018-07-30 ENCOUNTER — Telehealth: Payer: Self-pay

## 2018-07-30 ENCOUNTER — Inpatient Hospital Stay: Payer: Medicare Other | Attending: Hematology

## 2018-07-30 ENCOUNTER — Inpatient Hospital Stay (HOSPITAL_BASED_OUTPATIENT_CLINIC_OR_DEPARTMENT_OTHER): Payer: Medicare Other | Admitting: Hematology

## 2018-07-30 VITALS — BP 146/69 | HR 76 | Temp 98.3°F | Resp 17 | Ht 74.25 in | Wt 256.0 lb

## 2018-07-30 DIAGNOSIS — Z7984 Long term (current) use of oral hypoglycemic drugs: Secondary | ICD-10-CM

## 2018-07-30 DIAGNOSIS — C9110 Chronic lymphocytic leukemia of B-cell type not having achieved remission: Secondary | ICD-10-CM

## 2018-07-30 DIAGNOSIS — Z794 Long term (current) use of insulin: Secondary | ICD-10-CM | POA: Diagnosis not present

## 2018-07-30 DIAGNOSIS — R21 Rash and other nonspecific skin eruption: Secondary | ICD-10-CM | POA: Diagnosis not present

## 2018-07-30 DIAGNOSIS — Z7982 Long term (current) use of aspirin: Secondary | ICD-10-CM | POA: Insufficient documentation

## 2018-07-30 DIAGNOSIS — E119 Type 2 diabetes mellitus without complications: Secondary | ICD-10-CM

## 2018-07-30 LAB — CBC WITH DIFFERENTIAL/PLATELET
ABS IMMATURE GRANULOCYTES: 0 10*3/uL (ref 0.00–0.07)
BASOS ABS: 0 10*3/uL (ref 0.0–0.1)
Basophils Relative: 0 %
Eosinophils Absolute: 0 10*3/uL (ref 0.0–0.5)
Eosinophils Relative: 0 %
HEMATOCRIT: 41.3 % (ref 39.0–52.0)
HEMOGLOBIN: 13.2 g/dL (ref 13.0–17.0)
LYMPHS ABS: 46.3 10*3/uL — AB (ref 0.7–4.0)
LYMPHS PCT: 89 %
MCH: 31.5 pg (ref 26.0–34.0)
MCHC: 32 g/dL (ref 30.0–36.0)
MCV: 98.6 fL (ref 80.0–100.0)
Monocytes Absolute: 1 10*3/uL (ref 0.1–1.0)
Monocytes Relative: 2 %
NEUTROS ABS: 4.7 10*3/uL (ref 1.7–17.7)
NEUTROS PCT: 9 %
NRBC: 0 % (ref 0.0–0.2)
Platelets: 121 10*3/uL — ABNORMAL LOW (ref 150–400)
RBC: 4.19 MIL/uL — ABNORMAL LOW (ref 4.22–5.81)
RDW: 13.9 % (ref 11.5–15.5)
WBC: 52 10*3/uL (ref 4.0–10.5)

## 2018-07-30 LAB — CMP (CANCER CENTER ONLY)
ALT: 14 U/L (ref 0–44)
ANION GAP: 9 (ref 5–15)
AST: 21 U/L (ref 15–41)
Albumin: 3.8 g/dL (ref 3.5–5.0)
Alkaline Phosphatase: 98 U/L (ref 38–126)
BUN: 28 mg/dL — ABNORMAL HIGH (ref 8–23)
CHLORIDE: 108 mmol/L (ref 98–111)
CO2: 26 mmol/L (ref 22–32)
Calcium: 9.1 mg/dL (ref 8.9–10.3)
Creatinine: 2.1 mg/dL — ABNORMAL HIGH (ref 0.61–1.24)
GFR, EST AFRICAN AMERICAN: 33 mL/min — AB (ref >60)
GFR, Estimated: 28 mL/min — ABNORMAL LOW (ref >60)
Glucose, Bld: 54 mg/dL — ABNORMAL LOW (ref 70–99)
POTASSIUM: 4.3 mmol/L (ref 3.5–5.1)
SODIUM: 143 mmol/L (ref 135–145)
Total Bilirubin: 0.6 mg/dL (ref 0.3–1.2)
Total Protein: 6.4 g/dL — ABNORMAL LOW (ref 6.5–8.1)

## 2018-07-30 LAB — LACTATE DEHYDROGENASE: LDH: 214 U/L — AB (ref 98–192)

## 2018-07-30 MED ORDER — B-12 1000 MCG SL SUBL: 1000.0000 ug | SUBLINGUAL_TABLET | Freq: Every day | SUBLINGUAL | 3 refills | Status: DC

## 2018-07-30 NOTE — Progress Notes (Signed)
HEMATOLOGY/ONCOLOGY CLINIC NOTE  Date of Service: 07/30/2018  Patient Care Team: [name], MD as PCP - General (Family Medicine)  CHIEF COMPLAINTS/PURPOSE OF CONSULTATION:  F/u for CLL  HISTORY OF PRESENTING ILLNESS:   Benjamin Watkins is a wonderful 85 y.o. male who has been referred to [place] by his PCP, Dr. Brigitte Pulse from [place] for evaluation and management of elevated WBC.  Of note, Urine test on 11/20/16 show 2+ WBC in urine. Labs on 05/22/17 showed WBC at 18.7. PCP referred him with concern for a smoldering blood cancer. 06/26/17 Labs shows increase of WBC to 21.3.   He presents to the clinic today noting he was first told about elevated WBC in 03/2017. He notes he has felt normal overall, asymptomatic. He has had a few UTIs when he was told about his elevated in WBC. He notes to having a recent eye infection. He is still has residual infection and is continuing treatment.  He notes he has been slowly losing weight purposefully over the last 3 years as he has been watching his weight. He last about 30 pounds. In the past 6 months he lost 2-4 pounds. He lives with his wife and is able to get around well. He notes his age is starting to effect him but he is independent still. He is a currently retired Glass blower/designer.   In the past he was diagnosed with diabetes. He has never been a smoker and does not drink alcohol. He has not been on steroids. He has not received blood transfusion in the past nor has he had tattoos.   On review of symptoms, pt notes to eye infection symptoms. He denies change in breathing, SOB.  He denies abdominal pain, chills, night sweats, no new skin rashes and bums. He has swelling in his left ankle from an ongoing rash which is monitored by Dermatologist. He has occasional arthritis in his arms and knees with back problems at times. He reports to purposeful slow weight loss.    INTERVAL HISTORY:   [name] is here for management and  evaluation of his CLL. The patient's last visit with [place] was on 03/25/18. The pt reports that he is doing well overall.   The pt reports that he has followed up with neurology regarding his right foot drop. He also saw an orthopedist regarding his back, and notes that surgery is not being planned and he is intending to go to a chiropractor. He also notes that he is following along with his PCP regarding a recent increase in his creatinine.   The pt denies any fevers, chills, night sweats, or unexpected weight loss. The pt also endorses stable energy levels, denying any new fatigue. The pt also denies any abdominal pains and denies noticing any new lumps or bumps.   Lab results today (07/30/18) of CBC w/diff and CMP is as follows: all values are WNL except for WBC at 52.0k, RBC at 4.19, PLT at 121k, Lymphs abs at 46.3k, Glucose at 54, BUN at 28, Creatinine at 2.10, Total Protein at 6.4, GFR at 28. 07/30/18 LDH at 214  On review of systems, pt reports stable energy levels, stable slight ankle swelling, and denies fevers, chills, night sweats, unexpected weight loss, concerns of infections, concerns for bleeding, noticing any new lumps or bumps, abdominal pains, and any other symptoms.   MEDICAL HISTORY:  Past Medical History:  Diagnosis Date  . Diabetes mellitus without complication ([place])   . Diverticulosis   .  History of colon polyps   . Hypertension   . Low back pain   . Right foot drop     SURGICAL HISTORY: Past Surgical History:  Procedure Laterality Date  . CHOLECYSTECTOMY     laparoscopic  . COLONOSCOPY WITH PROPOFOL N/A 12/10/2015   Procedure: COLONOSCOPY WITH PROPOFOL;  Surgeon: Garlan Fair, MD;  Location: [facility];  Service: Endoscopy;  Laterality: N/A;  . EYE SURGERY Bilateral    bleeding retina  . TONSILLECTOMY      SOCIAL HISTORY: Social History   Socioeconomic History  . Marital status: Married    Spouse name: Not on file  . Number of children: 2  . Years of  education: some college  . Highest education level: Not on file  Occupational History  . Occupation: Retired  Scientific laboratory technician  . Financial resource strain: Not on file  . Food insecurity:    Worry: Not on file    Inability: Not on file  . Transportation needs:    Medical: Not on file    Non-medical: Not on file  Tobacco Use  . Smoking status: Never Smoker  . Smokeless tobacco: Never Used  Substance and Sexual Activity  . Alcohol use: No  . Drug use: No  . Sexual activity: Not on file  Lifestyle  . Physical activity:    Days per week: Not on file    Minutes per session: Not on file  . Stress: Not on file  Relationships  . Social connections:    Talks on phone: Not on file    Gets together: Not on file    Attends religious service: Not on file    Active member of club or organization: Not on file    Attends meetings of clubs or organizations: Not on file    Relationship status: Not on file  . Intimate partner violence:    Fear of current or ex partner: Not on file    Emotionally abused: Not on file    Physically abused: Not on file    Forced sexual activity: Not on file  Other Topics Concern  . Not on file  Social History Narrative   Lives at home with his wife.   Right-handed.   0.5 cup of coffee each morning, some tea.    FAMILY HISTORY: Family History  Problem Relation Age of Onset  . Stroke Mother   . Diabetes Mother   . Other Father        "old age"    ALLERGIES:  is allergic to hydrochlorothiazide and lisinopril.  MEDICATIONS:  Current Outpatient Medications  Medication Sig Dispense Refill  . acetaminophen (TYLENOL) 325 MG tablet Take 650 mg by mouth 2 (two) times daily.    Marland Kitchen aspirin EC 81 MG tablet Take 81 mg by mouth daily.    . carvedilol (COREG) 25 MG tablet Take 25 mg by mouth 2 (two) times daily with a meal.    . chlorthalidone (HYGROTON) 25 MG tablet Take 25 mg by mouth daily.    . Cyanocobalamin (B-12) 1000 MCG SUBL Place 1,000 mcg under the  tongue daily. 30 each 3  . gabapentin (NEURONTIN) 300 MG capsule Take 1 capsule (300 mg total) by mouth 3 (three) times daily. 90 capsule 11  . glimepiride (AMARYL) 2 MG tablet Take 2 mg by mouth daily with breakfast.    . hydrALAZINE (APRESOLINE) 25 MG tablet Take 25 mg by mouth 2 (two) times daily.     . niacin 500 MG  tablet Take 500 mg by mouth at bedtime.    Marland Kitchen NOVOLIN 70/30 RELION (70-30) 100 UNIT/ML injection Inject 70-74 Units into the skin 2 (two) times daily. Takes 74 units in the morning and 70 units at night.    Marland Kitchen omeprazole (PRILOSEC) 20 MG capsule Take 20 mg by mouth daily.    Vladimir Faster Glycol-Propyl Glycol (SYSTANE ULTRA OP) Place 2 drops into both eyes daily as needed (For dry eyes.).     No current facility-administered medications for this visit.     REVIEW OF SYSTEMS:   A 10+ POINT REVIEW OF SYSTEMS WAS OBTAINED including neurology, dermatology, psychiatry, cardiac, respiratory, lymph, extremities, GI, GU, Musculoskeletal, constitutional, breasts, reproductive, HEENT.  All pertinent positives are noted in the HPI.  All others are negative.   PHYSICAL EXAMINATION:  ECOG PERFORMANCE STATUS: 1 - Symptomatic but completely ambulatory  . Vitals:   07/30/18 1227  BP: (!) 146/69  Pulse: 76  Resp: 17  Temp: 98.3 F (36.8 C)  SpO2: 97%   Filed Weights   07/30/18 1227  Weight: 256 lb (116.1 kg)   .Body mass index is 32.65 kg/m.  GENERAL:alert, in no acute distress and comfortable SKIN: no acute rashes, no significant lesions EYES: conjunctiva are pink and non-injected, sclera anicteric OROPHARYNX: MMM, no exudates, no oropharyngeal erythema or ulceration NECK: supple, no JVD LYMPH:  no palpable lymphadenopathy in the cervical, axillary or inguinal regions LUNGS: clear to auscultation b/l with normal respiratory effort HEART: regular rate & rhythm ABDOMEN:  normoactive bowel sounds , non tender, not distended. No palpable hepatosplenomegaly.  Extremity: 1+ pedal  edema  PSYCH: alert & oriented x 3 with fluent speech NEURO: no focal motor/sensory deficits   LABORATORY DATA:  I have reviewed the data as listed  . CBC Latest Ref Rng & Units 07/30/2018 03/25/2018 11/24/2017  WBC 4.0 - 10.5 K/uL 52.0(HH) 34.2(H) 26.9(H)  Hemoglobin 13.0 - 17.0 g/dL 13.2 14.2 14.8  Hematocrit 39.0 - 52.0 % 41.3 43.2 44.6  Platelets 150 - 400 K/uL 121(L) 120(L) 130(L)   . CBC    Component Value Date/Time   WBC 52.0 (HH) 07/30/2018 1057   RBC 4.19 (L) 07/30/2018 1057   HGB 13.2 07/30/2018 1057   HGB 14.2 03/25/2018 1250   HCT 41.3 07/30/2018 1057   PLT 121 (L) 07/30/2018 1057   PLT 120 (L) 03/25/2018 1250   MCV 98.6 07/30/2018 1057   MCH 31.5 07/30/2018 1057   MCHC 32.0 07/30/2018 1057   RDW 13.9 07/30/2018 1057   LYMPHSABS 46.3 (H) 07/30/2018 1057   MONOABS 1.0 07/30/2018 1057   EOSABS 0.0 07/30/2018 1057   BASOSABS 0.0 07/30/2018 1057    . CMP Latest Ref Rng & Units 07/30/2018 03/25/2018 11/24/2017  Glucose 70 - 99 mg/dL 54(L) 98 97  BUN 8 - 23 mg/dL 28(H) 24(H) 29(H)  Creatinine 0.61 - 1.24 mg/dL 2.10(H) 1.53(H) 1.52(H)  Sodium 135 - 145 mmol/L 143 139 141  Potassium 3.5 - 5.1 mmol/L 4.3 4.4 4.5  Chloride 98 - 111 mmol/L 108 107 109  CO2 22 - 32 mmol/L 26 24 26   Calcium 8.9 - 10.3 mg/dL 9.1 9.0 9.5  Total Protein 6.5 - 8.1 g/dL 6.4(L) 6.0(L) 6.5  Total Bilirubin 0.3 - 1.2 mg/dL 0.6 0.5 0.5  Alkaline Phos 38 - 126 U/L 98 93 107  AST 15 - 41 U/L 21 16 17   ALT 0 - 44 U/L 14 12 13     . Lab Results  Component Value Date  LDH 214 (H) 07/30/2018   11/24/17 FISH CLL Prognostic:     RADIOGRAPHIC STUDIES: I have personally reviewed the radiological images as listed and agreed with the findings in the report. No results found.   ASSESSMENT & PLAN:   NYMIR RINGLER is a 85 y.o. caucasian male with    1. Chronic Lymphocytic Leukemia The above w/u was reviewed and diagnosis of CLL was made based on PBS and flow cytometry results Likely Rai  0 Patient has thrombocytopenia PLT 117k (not <100k to count as staging criteria) No anemia or clinical splenomegaly 11/24/17 FISH CLL Prognostic panel did not reveal a standard mutation.  Lab Results  Component Value Date   LDH 214 (H) 07/30/2018   PLAN:  -Discussed pt labwork today, 07/30/18; WBC have increased from 34.2k four months ago to 52.0k today. HGB remains normal. PLT are stable at 121k.  -Begin PO 1035mcg Vitamin B12 every day replacement as the 04/05/18 level was low at 208 -Discussed that we will keep a closer eye on his WBC as they did increase in the past 4 months, though he denies any new symptoms  -Continue follow up with PCP regarding creatinine increase -Advised staying up to date with annual flu vaccine, and every-5-year pneumonia vaccines, which the pt notes he has done with his PCP  -Again discussed the various concerning symptoms that would require further treatment: fever, chills, night sweats, unexpected weight loss, enlarged lymph glands, or splenomegaly. -Will see the pt back in 4 months, sooner if any new concerns   2.  Past Medical History:  Diagnosis Date  . Diabetes mellitus without complication ([place])   . Diverticulosis   . History of colon polyps   . Hypertension   . Low back pain   . Right foot drop   -GERD, CAD, CKD stage 3, Obesity, Hyperlipidemia  -Managed by PCP   3. Left Ankle Rash and swelling -likely stasis dermatitis. -According to pt he had a Doppler done in the past with no evidence of blood clots.  -Monitored by Dermatologist    RTC with Dr [name] with labs in 4 months    All of the patients questions were answered with apparent satisfaction. The patient knows to call the clinic with any problems, questions or concerns.  The total time spent in the appt was 20 minutes and more than 50% was on counseling and direct patient cares.     Sullivan Lone MD MS AAHIVMS [facility] Hematology/Oncology Physician [facility]  (Office):        [phone] (Work cell):  [phone] (Fax):           [phone]  07/30/2018 1:02 PM  I, Baldwin [place], am acting as a scribe for Dr. Sullivan Lone.   .I have reviewed the above documentation for accuracy and completeness, and I agree with the above. Brunetta Genera MD

## 2018-07-30 NOTE — Telephone Encounter (Signed)
Printed avs and calender of upcoming appointment. Per 1/3 los 

## 2018-08-16 DIAGNOSIS — N179 Acute kidney failure, unspecified: Secondary | ICD-10-CM | POA: Diagnosis not present

## 2018-09-16 ENCOUNTER — Encounter: Payer: Self-pay | Admitting: Neurology

## 2018-09-16 DIAGNOSIS — N179 Acute kidney failure, unspecified: Secondary | ICD-10-CM | POA: Diagnosis not present

## 2018-10-05 DIAGNOSIS — E113491 Type 2 diabetes mellitus with severe nonproliferative diabetic retinopathy without macular edema, right eye: Secondary | ICD-10-CM | POA: Diagnosis not present

## 2018-10-05 DIAGNOSIS — E113492 Type 2 diabetes mellitus with severe nonproliferative diabetic retinopathy without macular edema, left eye: Secondary | ICD-10-CM | POA: Diagnosis not present

## 2018-10-05 DIAGNOSIS — H35043 Retinal micro-aneurysms, unspecified, bilateral: Secondary | ICD-10-CM | POA: Diagnosis not present

## 2018-10-05 DIAGNOSIS — H43811 Vitreous degeneration, right eye: Secondary | ICD-10-CM | POA: Diagnosis not present

## 2018-10-07 ENCOUNTER — Ambulatory Visit: Payer: Self-pay | Admitting: Neurology

## 2018-10-12 DIAGNOSIS — G5731 Lesion of lateral popliteal nerve, right lower limb: Secondary | ICD-10-CM | POA: Diagnosis not present

## 2018-11-02 DIAGNOSIS — R4182 Altered mental status, unspecified: Secondary | ICD-10-CM | POA: Diagnosis not present

## 2018-11-02 DIAGNOSIS — E1165 Type 2 diabetes mellitus with hyperglycemia: Secondary | ICD-10-CM | POA: Diagnosis not present

## 2018-11-02 DIAGNOSIS — E161 Other hypoglycemia: Secondary | ICD-10-CM | POA: Diagnosis not present

## 2018-11-02 DIAGNOSIS — I447 Left bundle-branch block, unspecified: Secondary | ICD-10-CM | POA: Diagnosis not present

## 2018-11-02 DIAGNOSIS — E162 Hypoglycemia, unspecified: Secondary | ICD-10-CM | POA: Diagnosis not present

## 2018-11-25 NOTE — Progress Notes (Signed)
HEMATOLOGY/ONCOLOGY CLINIC NOTE  Date of Service: 11/26/2018  Patient Care Team: [name], MD as PCP - General (Family Medicine)  CHIEF COMPLAINTS/PURPOSE OF CONSULTATION:  F/u for CLL  HISTORY OF PRESENTING ILLNESS:   Benjamin Watkins is a wonderful 85 y.o. male who has been referred to [place] by his PCP, Dr. Brigitte Pulse from [place] for evaluation and management of elevated WBC.  Of note, Urine test on 11/20/16 show 2+ WBC in urine. Labs on 05/22/17 showed WBC at 18.7. PCP referred him with concern for a smoldering blood cancer. 06/26/17 Labs shows increase of WBC to 21.3.   He presents to the clinic today noting he was first told about elevated WBC in 03/2017. He notes he has felt normal overall, asymptomatic. He has had a few UTIs when he was told about his elevated in WBC. He notes to having a recent eye infection. He is still has residual infection and is continuing treatment.  He notes he has been slowly losing weight purposefully over the last 3 years as he has been watching his weight. He last about 30 pounds. In the past 6 months he lost 2-4 pounds. He lives with his wife and is able to get around well. He notes his age is starting to effect him but he is independent still. He is a currently retired Glass blower/designer.   In the past he was diagnosed with diabetes. He has never been a smoker and does not drink alcohol. He has not been on steroids. He has not received blood transfusion in the past nor has he had tattoos.   On review of symptoms, pt notes to eye infection symptoms. He denies change in breathing, SOB.  He denies abdominal pain, chills, night sweats, no new skin rashes and bums. He has swelling in his left ankle from an ongoing rash which is monitored by Dermatologist. He has occasional arthritis in his arms and knees with back problems at times. He reports to purposeful slow weight loss.    INTERVAL HISTORY:   [name] is here for management and  evaluation of his CLL. The patient's last visit with [place] was on 07/30/18. The pt reports that he is doing well overall.  The pt reports that he has not developed any new concerns in the interim. The pt denies any fevers, chills, night sweats, or unexpected weight loss. He denies noticing any new lumps or bumps. He denies concerns for infections. Today is the first day that he has left his house since 10/12/18, and has been very compliant with the shelter in place recommendations. He denies SOB or abdominal pains and notes that his ankle swelling is stable. The pt notes that "everything feels like it always has," endorsing general stability.   Lab results today (11/26/18) of CBC w/diff and CMP is as follows: all values are WNL except for WBC at 79.7k, RBC at 3.98, HGB at 12.5, PLT at 124k, Lymphs abs at 60.6k, Monocytes abs at 12.8k, Basophils abs at 400, Abs immature granulocytes at 0.24k, Creatinine at 1.79, Calcium at 8.6, Total Protein at 6.0, GFR at 34. 11/26/18 LDH at 217  On review of systems, pt reports stable energy levels, eating well, stable weight, stable ankle swelling, and denies fevers, chills, night sweats, unexpected weight loss, concerns for infections, new lumps or bumps, abdominal pains, and any other symptoms.   MEDICAL HISTORY:  Past Medical History:  Diagnosis Date  . Diabetes mellitus without complication ([place])   .  Diverticulosis   . History of colon polyps   . Hypertension   . Low back pain   . Right foot drop     SURGICAL HISTORY: Past Surgical History:  Procedure Laterality Date  . CHOLECYSTECTOMY     laparoscopic  . COLONOSCOPY WITH PROPOFOL N/A 12/10/2015   Procedure: COLONOSCOPY WITH PROPOFOL;  Surgeon: Garlan Fair, MD;  Location: [facility];  Service: Endoscopy;  Laterality: N/A;  . EYE SURGERY Bilateral    bleeding retina  . TONSILLECTOMY      SOCIAL HISTORY: Social History   Socioeconomic History  . Marital status: Married    Spouse name: Not on  file  . Number of children: 2  . Years of education: some college  . Highest education level: Not on file  Occupational History  . Occupation: Retired  Scientific laboratory technician  . Financial resource strain: Not on file  . Food insecurity:    Worry: Not on file    Inability: Not on file  . Transportation needs:    Medical: Not on file    Non-medical: Not on file  Tobacco Use  . Smoking status: Never Smoker  . Smokeless tobacco: Never Used  Substance and Sexual Activity  . Alcohol use: No  . Drug use: No  . Sexual activity: Not on file  Lifestyle  . Physical activity:    Days per week: Not on file    Minutes per session: Not on file  . Stress: Not on file  Relationships  . Social connections:    Talks on phone: Not on file    Gets together: Not on file    Attends religious service: Not on file    Active member of club or organization: Not on file    Attends meetings of clubs or organizations: Not on file    Relationship status: Not on file  . Intimate partner violence:    Fear of current or ex partner: Not on file    Emotionally abused: Not on file    Physically abused: Not on file    Forced sexual activity: Not on file  Other Topics Concern  . Not on file  Social History Narrative   Lives at home with his wife.   Right-handed.   0.5 cup of coffee each morning, some tea.    FAMILY HISTORY: Family History  Problem Relation Age of Onset  . Stroke Mother   . Diabetes Mother   . Other Father        "old age"    ALLERGIES:  is allergic to hydrochlorothiazide and lisinopril.  MEDICATIONS:  Current Outpatient Medications  Medication Sig Dispense Refill  . acetaminophen (TYLENOL) 325 MG tablet Take 650 mg by mouth 2 (two) times daily.    Marland Kitchen aspirin EC 81 MG tablet Take 81 mg by mouth daily.    . carvedilol (COREG) 25 MG tablet Take 25 mg by mouth 2 (two) times daily with a meal.    . chlorthalidone (HYGROTON) 25 MG tablet Take 25 mg by mouth daily.    . Cyanocobalamin (B-12)  1000 MCG SUBL Place 1,000 mcg under the tongue daily. 30 each 3  . gabapentin (NEURONTIN) 300 MG capsule Take 1 capsule (300 mg total) by mouth 3 (three) times daily. 90 capsule 11  . glimepiride (AMARYL) 2 MG tablet Take 2 mg by mouth daily with breakfast.    . hydrALAZINE (APRESOLINE) 25 MG tablet Take 25 mg by mouth 2 (two) times daily.     Marland Kitchen  niacin 500 MG tablet Take 500 mg by mouth at bedtime.    Marland Kitchen NOVOLIN 70/30 RELION (70-30) 100 UNIT/ML injection Inject 70-74 Units into the skin 2 (two) times daily. Takes 74 units in the morning and 70 units at night.    Marland Kitchen omeprazole (PRILOSEC) 20 MG capsule Take 20 mg by mouth daily.    Vladimir Faster Glycol-Propyl Glycol (SYSTANE ULTRA OP) Place 2 drops into both eyes daily as needed (For dry eyes.).     No current facility-administered medications for this visit.     REVIEW OF SYSTEMS:   A 10+ POINT REVIEW OF SYSTEMS WAS OBTAINED including neurology, dermatology, psychiatry, cardiac, respiratory, lymph, extremities, GI, GU, Musculoskeletal, constitutional, breasts, reproductive, HEENT.  All pertinent positives are noted in the HPI.  All others are negative.    PHYSICAL EXAMINATION:  ECOG PERFORMANCE STATUS: 1 - Symptomatic but completely ambulatory  . Vitals:   11/26/18 1112  BP: 120/60  Pulse: 73  Resp: 17  Temp: 98.4 F (36.9 C)  SpO2: 98%   Filed Weights   11/26/18 1112  Weight: 263 lb 6.4 oz (119.5 kg)   .Body mass index is 33.59 kg/m.  GENERAL:alert, in no acute distress and comfortable SKIN: no acute rashes, no significant lesions EYES: conjunctiva are pink and non-injected, sclera anicteric OROPHARYNX: MMM, no exudates, no oropharyngeal erythema or ulceration NECK: supple, no JVD LYMPH:  no palpable lymphadenopathy in the cervical, axillary or inguinal regions LUNGS: clear to auscultation b/l with normal respiratory effort HEART: regular rate & rhythm ABDOMEN:  normoactive bowel sounds , non tender, not distended. No palpable  hepatosplenomegaly.  Extremity: 2+ pedal edema PSYCH: alert & oriented x 3 with fluent speech NEURO: no focal motor/sensory deficits   LABORATORY DATA:  I have reviewed the data as listed  . CBC Latest Ref Rng & Units 11/26/2018 07/30/2018 03/25/2018  WBC 4.0 - 10.5 K/uL 79.7(HH) 52.0(HH) 34.2(H)  Hemoglobin 13.0 - 17.0 g/dL 12.5(L) 13.2 14.2  Hematocrit 39.0 - 52.0 % 39.3 41.3 43.2  Platelets 150 - 400 K/uL 124(L) 121(L) 120(L)   . CBC    Component Value Date/Time   WBC 79.7 (HH) 11/26/2018 1045   RBC 3.98 (L) 11/26/2018 1045   HGB 12.5 (L) 11/26/2018 1045   HGB 14.2 03/25/2018 1250   HCT 39.3 11/26/2018 1045   PLT 124 (L) 11/26/2018 1045   PLT 120 (L) 03/25/2018 1250   MCV 98.7 11/26/2018 1045   MCH 31.4 11/26/2018 1045   MCHC 31.8 11/26/2018 1045   RDW 14.5 11/26/2018 1045   LYMPHSABS 60.6 (H) 11/26/2018 1045   MONOABS 12.8 (H) 11/26/2018 1045   EOSABS 0.3 11/26/2018 1045   BASOSABS 0.4 (H) 11/26/2018 1045    . CMP Latest Ref Rng & Units 11/26/2018 07/30/2018 03/25/2018  Glucose 70 - 99 mg/dL 83 54(L) 98  BUN 8 - 23 mg/dL 22 28(H) 24(H)  Creatinine 0.61 - 1.24 mg/dL 1.79(H) 2.10(H) 1.53(H)  Sodium 135 - 145 mmol/L 141 143 139  Potassium 3.5 - 5.1 mmol/L 4.3 4.3 4.4  Chloride 98 - 111 mmol/L 107 108 107  CO2 22 - 32 mmol/L 26 26 24   Calcium 8.9 - 10.3 mg/dL 8.6(L) 9.1 9.0  Total Protein 6.5 - 8.1 g/dL 6.0(L) 6.4(L) 6.0(L)  Total Bilirubin 0.3 - 1.2 mg/dL 0.5 0.6 0.5  Alkaline Phos 38 - 126 U/L 117 98 93  AST 15 - 41 U/L 15 21 16   ALT 0 - 44 U/L 9 14 12     .  Lab Results  Component Value Date   LDH 217 (H) 11/26/2018   11/24/17 FISH CLL Prognostic:     RADIOGRAPHIC STUDIES: I have personally reviewed the radiological images as listed and agreed with the findings in the report. No results found.   ASSESSMENT & PLAN:   MASOUD NYCE is a 85 y.o. caucasian male with    1. Chronic Lymphocytic Leukemia The above w/u was reviewed and diagnosis of CLL was  made based on PBS and flow cytometry results Likely Rai 0 Patient has thrombocytopenia PLT 117k (not <100k to count as staging criteria) No anemia or clinical splenomegaly 11/24/17 FISH CLL Prognostic panel did not reveal a standard mutation.  Lab Results  Component Value Date   LDH 217 (H) 11/26/2018   PLAN:  -Discussed pt labwork today, 11/26/18; WBC increased to 79.7k from 52.0k three months ago. HGB stable at 12.5. PLT stable as 124k. Chemistries are stable. -Discussed that the pt's WBC have shown a doubling rate in the last 8 months and may indicate treatment could be considered in the next year or so -He continues to deny constitutional symptoms -PO 1065mcg Vitamin B12 every day replacement as the 04/05/18 level was low at 208 -Continue follow up with PCP regarding creatinine increase -Advised staying up to date with annual flu vaccine, and every-5-year pneumonia vaccines, which the pt notes he has done with his PCP  -Again discussed the various concerning symptoms that would require further treatment: fever, chills, night sweats, unexpected weight loss, enlarged lymph glands, or splenomegaly. -Will see the pt back in 2 months  2.  Past Medical History:  Diagnosis Date  . Diabetes mellitus without complication ([place])   . Diverticulosis   . History of colon polyps   . Hypertension   . Low back pain   . Right foot drop   -GERD, CAD, CKD stage 3, Obesity, Hyperlipidemia  -Managed by PCP   3. Left Ankle Rash and swelling -likely stasis dermatitis. -According to pt he had a Doppler done in the past with no evidence of blood clots.  -Monitored by Dermatologist    RTC with dr [name] with labs in 10 weeks   All of the patients questions were answered with apparent satisfaction. The patient knows to call the clinic with any problems, questions or concerns.  The total time spent in the appt was 20 minutes and more than 50% was on counseling and direct patient cares.    Sullivan Lone  MD MS AAHIVMS [facility] Hematology/Oncology Physician [facility]  (Office):       [phone] (Work cell):  [phone] (Fax):           [phone]  11/26/2018 11:48 AM  I, Baldwin Jamaica, am acting as a scribe for Dr. Sullivan Lone.   .I have reviewed the above documentation for accuracy and completeness, and I agree with the above. Brunetta Genera MD

## 2018-11-26 ENCOUNTER — Inpatient Hospital Stay: Payer: Medicare Other

## 2018-11-26 ENCOUNTER — Telehealth: Payer: Self-pay | Admitting: Hematology

## 2018-11-26 ENCOUNTER — Other Ambulatory Visit: Payer: Self-pay

## 2018-11-26 ENCOUNTER — Telehealth: Payer: Self-pay | Admitting: *Deleted

## 2018-11-26 ENCOUNTER — Inpatient Hospital Stay: Payer: Medicare Other | Attending: Hematology | Admitting: Hematology

## 2018-11-26 VITALS — BP 120/60 | HR 73 | Temp 98.4°F | Resp 17 | Ht 74.25 in | Wt 263.4 lb

## 2018-11-26 DIAGNOSIS — M25472 Effusion, left ankle: Secondary | ICD-10-CM | POA: Insufficient documentation

## 2018-11-26 DIAGNOSIS — Z7984 Long term (current) use of oral hypoglycemic drugs: Secondary | ICD-10-CM | POA: Diagnosis not present

## 2018-11-26 DIAGNOSIS — R21 Rash and other nonspecific skin eruption: Secondary | ICD-10-CM | POA: Diagnosis not present

## 2018-11-26 DIAGNOSIS — E119 Type 2 diabetes mellitus without complications: Secondary | ICD-10-CM | POA: Diagnosis not present

## 2018-11-26 DIAGNOSIS — K219 Gastro-esophageal reflux disease without esophagitis: Secondary | ICD-10-CM | POA: Insufficient documentation

## 2018-11-26 DIAGNOSIS — C9110 Chronic lymphocytic leukemia of B-cell type not having achieved remission: Secondary | ICD-10-CM

## 2018-11-26 DIAGNOSIS — D696 Thrombocytopenia, unspecified: Secondary | ICD-10-CM | POA: Diagnosis not present

## 2018-11-26 DIAGNOSIS — Z79899 Other long term (current) drug therapy: Secondary | ICD-10-CM | POA: Insufficient documentation

## 2018-11-26 DIAGNOSIS — Z7982 Long term (current) use of aspirin: Secondary | ICD-10-CM | POA: Insufficient documentation

## 2018-11-26 LAB — CMP (CANCER CENTER ONLY)
ALT: 9 U/L (ref 0–44)
AST: 15 U/L (ref 15–41)
Albumin: 3.6 g/dL (ref 3.5–5.0)
Alkaline Phosphatase: 117 U/L (ref 38–126)
Anion gap: 8 (ref 5–15)
BUN: 22 mg/dL (ref 8–23)
CO2: 26 mmol/L (ref 22–32)
Calcium: 8.6 mg/dL — ABNORMAL LOW (ref 8.9–10.3)
Chloride: 107 mmol/L (ref 98–111)
Creatinine: 1.79 mg/dL — ABNORMAL HIGH (ref 0.61–1.24)
GFR, Est AFR Am: 40 mL/min — ABNORMAL LOW (ref >60)
GFR, Estimated: 34 mL/min — ABNORMAL LOW (ref >60)
Glucose, Bld: 83 mg/dL (ref 70–99)
Potassium: 4.3 mmol/L (ref 3.5–5.1)
Sodium: 141 mmol/L (ref 135–145)
Total Bilirubin: 0.5 mg/dL (ref 0.3–1.2)
Total Protein: 6 g/dL — ABNORMAL LOW (ref 6.5–8.1)

## 2018-11-26 LAB — CBC WITH DIFFERENTIAL/PLATELET
Abs Immature Granulocytes: 0.24 10*3/uL — ABNORMAL HIGH (ref 0.00–0.07)
Basophils Absolute: 0.4 10*3/uL — ABNORMAL HIGH (ref 0.0–0.1)
Basophils Relative: 1 %
Eosinophils Absolute: 0.3 10*3/uL (ref 0.0–0.5)
Eosinophils Relative: 0 %
HCT: 39.3 % (ref 39.0–52.0)
Hemoglobin: 12.5 g/dL — ABNORMAL LOW (ref 13.0–17.0)
Immature Granulocytes: 0 %
Lymphocytes Relative: 76 %
Lymphs Abs: 60.6 10*3/uL — ABNORMAL HIGH (ref 0.7–4.0)
MCH: 31.4 pg (ref 26.0–34.0)
MCHC: 31.8 g/dL (ref 30.0–36.0)
MCV: 98.7 fL (ref 80.0–100.0)
Monocytes Absolute: 12.8 10*3/uL — ABNORMAL HIGH (ref 0.1–1.0)
Monocytes Relative: 16 %
Neutro Abs: 5.3 10*3/uL (ref 1.7–7.7)
Neutrophils Relative %: 7 %
Platelets: 124 10*3/uL — ABNORMAL LOW (ref 150–400)
RBC: 3.98 MIL/uL — ABNORMAL LOW (ref 4.22–5.81)
RDW: 14.5 % (ref 11.5–15.5)
WBC: 79.7 10*3/uL (ref 4.0–10.5)
nRBC: 0 % (ref 0.0–0.2)

## 2018-11-26 LAB — LACTATE DEHYDROGENASE: LDH: 217 U/L — ABNORMAL HIGH (ref 98–192)

## 2018-11-26 NOTE — Telephone Encounter (Signed)
Scheduled appt per 5/1 los ° °A calendar will be mailed out. °

## 2018-11-26 NOTE — Telephone Encounter (Signed)
Received call report from [facility].  "Today's WBC = 79.7."  Secure message sent to provider and collaborative with results.  11:20 am Scheduled provider F/U today.

## 2018-11-29 ENCOUNTER — Other Ambulatory Visit: Payer: Self-pay | Admitting: Hematology

## 2018-11-30 DIAGNOSIS — N179 Acute kidney failure, unspecified: Secondary | ICD-10-CM | POA: Diagnosis not present

## 2018-12-02 IMAGING — CR DG CHEST 2V
2 series · 2 of 2 positions shown · non-contrast
Comparison: 06/17/2004

CLINICAL DATA: Cough for several days, hypertension

EXAM:
CHEST  2 VIEW

[w chest pa]
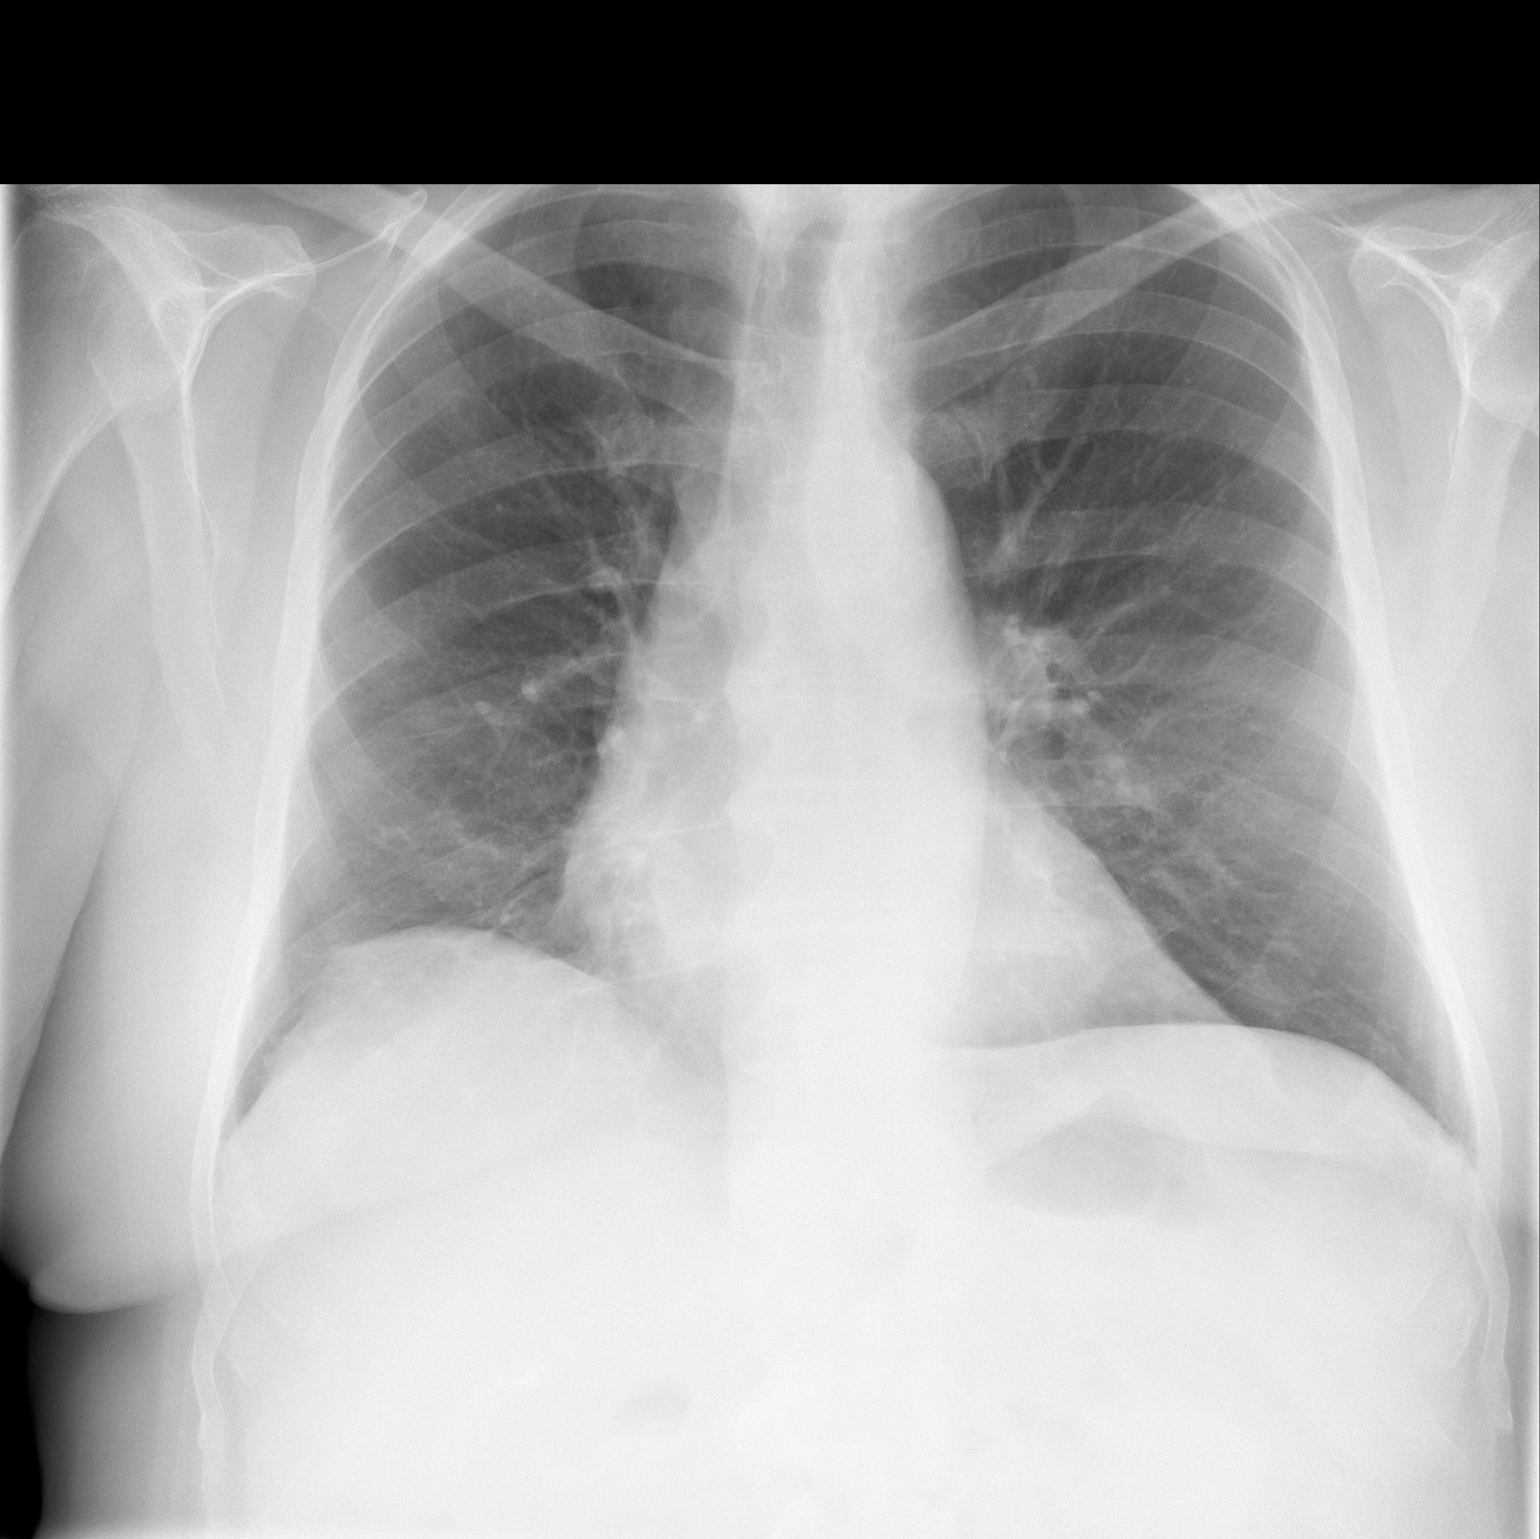

[w chest lat]
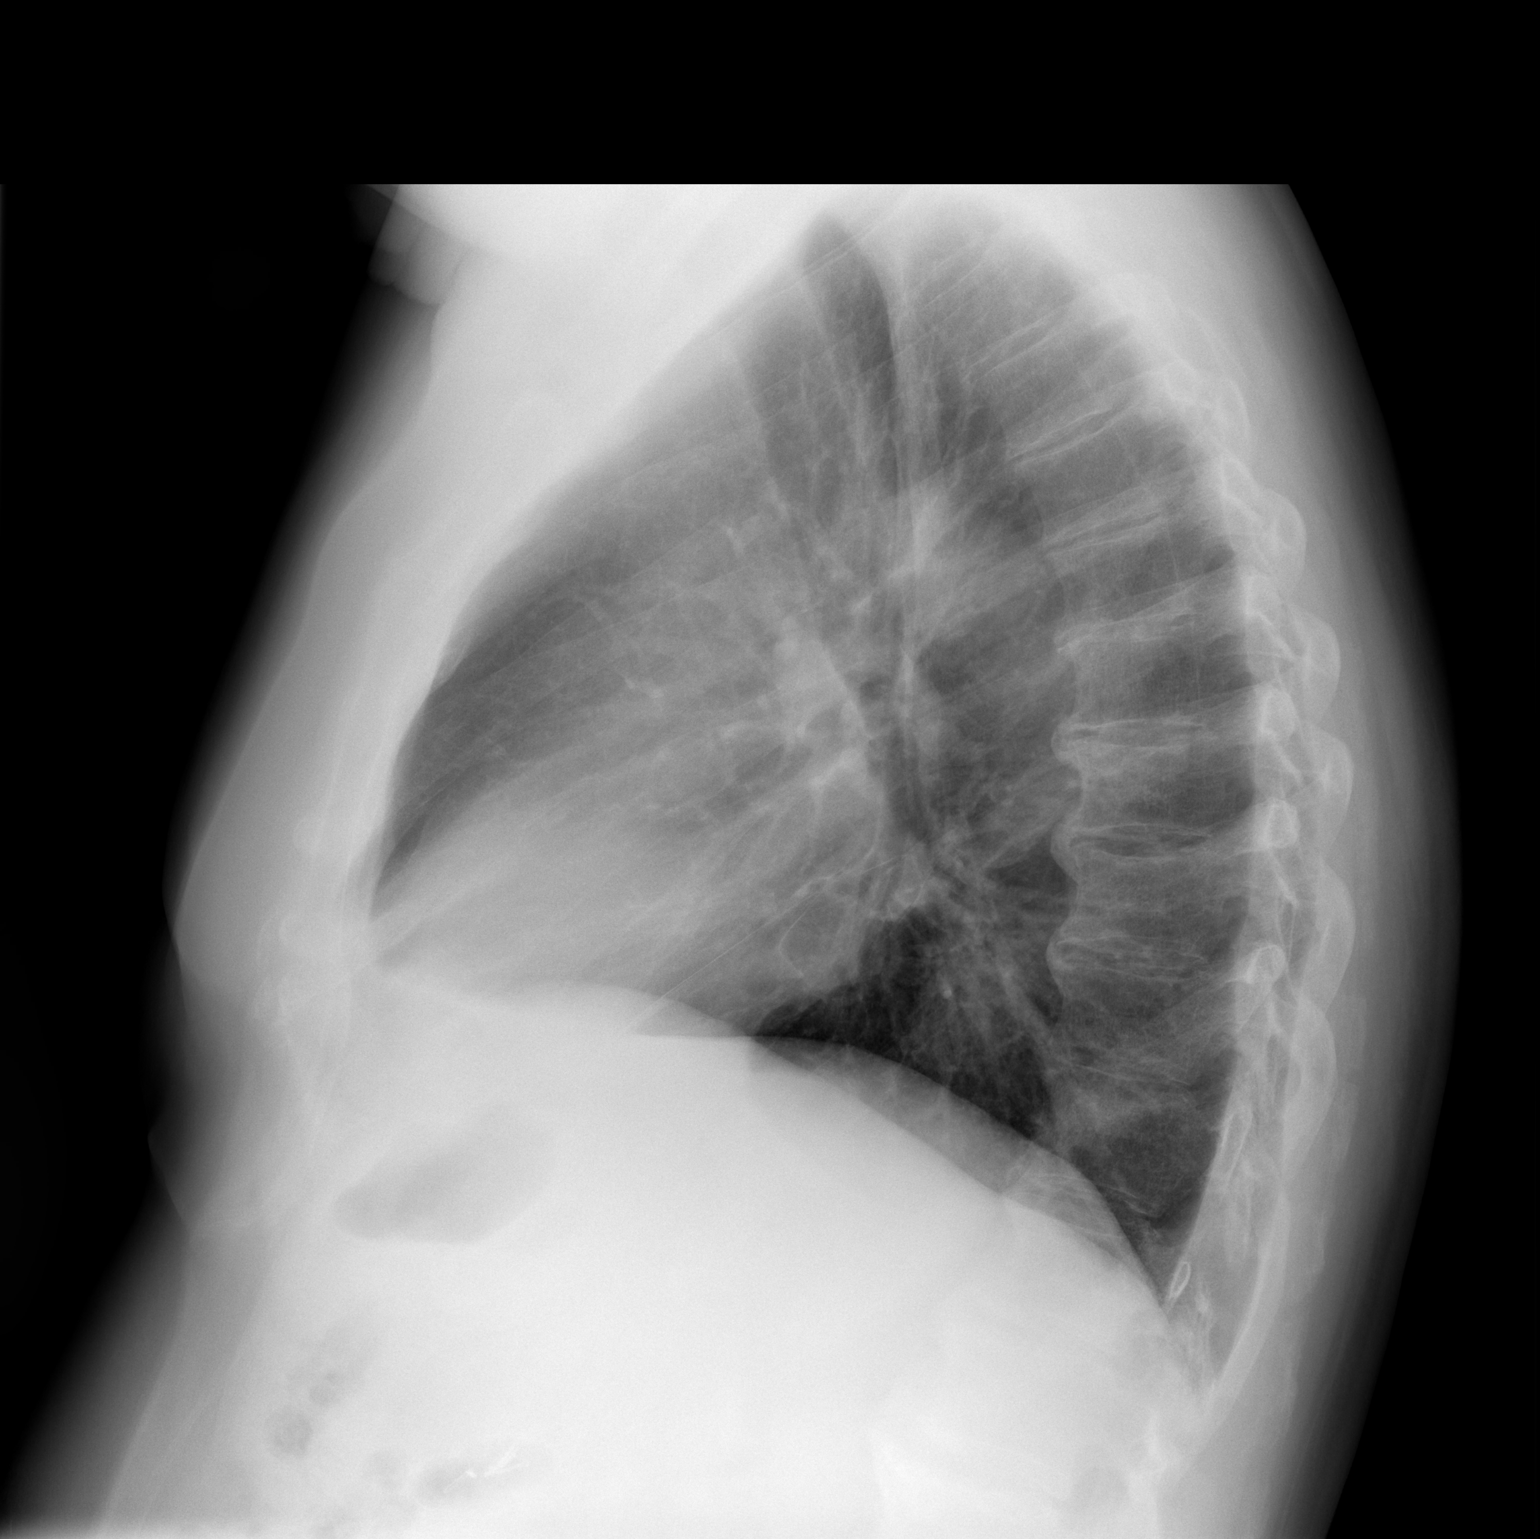

[2 of 2 positions shown; findings below may reference images not displayed]

FINDINGS: Heart and mediastinal contours are within normal limits. No focal
opacities or effusions. No acute bony abnormality.
IMPRESSION: No active cardiopulmonary disease.

## 2018-12-19 DIAGNOSIS — I447 Left bundle-branch block, unspecified: Secondary | ICD-10-CM | POA: Diagnosis not present

## 2018-12-19 DIAGNOSIS — E162 Hypoglycemia, unspecified: Secondary | ICD-10-CM | POA: Diagnosis not present

## 2018-12-19 DIAGNOSIS — R4020 Unspecified coma: Secondary | ICD-10-CM | POA: Diagnosis not present

## 2018-12-19 DIAGNOSIS — R404 Transient alteration of awareness: Secondary | ICD-10-CM | POA: Diagnosis not present

## 2018-12-19 DIAGNOSIS — E161 Other hypoglycemia: Secondary | ICD-10-CM | POA: Diagnosis not present

## 2019-01-10 DIAGNOSIS — Z794 Long term (current) use of insulin: Secondary | ICD-10-CM | POA: Diagnosis not present

## 2019-01-10 DIAGNOSIS — N183 Chronic kidney disease, stage 3 (moderate): Secondary | ICD-10-CM | POA: Diagnosis not present

## 2019-01-10 DIAGNOSIS — E782 Mixed hyperlipidemia: Secondary | ICD-10-CM | POA: Diagnosis not present

## 2019-01-10 DIAGNOSIS — E11319 Type 2 diabetes mellitus with unspecified diabetic retinopathy without macular edema: Secondary | ICD-10-CM | POA: Diagnosis not present

## 2019-01-13 DIAGNOSIS — N183 Chronic kidney disease, stage 3 (moderate): Secondary | ICD-10-CM | POA: Diagnosis not present

## 2019-01-13 DIAGNOSIS — K219 Gastro-esophageal reflux disease without esophagitis: Secondary | ICD-10-CM | POA: Diagnosis not present

## 2019-01-13 DIAGNOSIS — E11319 Type 2 diabetes mellitus with unspecified diabetic retinopathy without macular edema: Secondary | ICD-10-CM | POA: Diagnosis not present

## 2019-01-13 DIAGNOSIS — I1310 Hypertensive heart and chronic kidney disease without heart failure, with stage 1 through stage 4 chronic kidney disease, or unspecified chronic kidney disease: Secondary | ICD-10-CM | POA: Diagnosis not present

## 2019-01-13 DIAGNOSIS — I25119 Atherosclerotic heart disease of native coronary artery with unspecified angina pectoris: Secondary | ICD-10-CM | POA: Diagnosis not present

## 2019-01-13 DIAGNOSIS — Z0000 Encounter for general adult medical examination without abnormal findings: Secondary | ICD-10-CM | POA: Diagnosis not present

## 2019-01-13 DIAGNOSIS — E669 Obesity, unspecified: Secondary | ICD-10-CM | POA: Diagnosis not present

## 2019-01-13 DIAGNOSIS — I8310 Varicose veins of unspecified lower extremity with inflammation: Secondary | ICD-10-CM | POA: Diagnosis not present

## 2019-01-13 DIAGNOSIS — C9110 Chronic lymphocytic leukemia of B-cell type not having achieved remission: Secondary | ICD-10-CM | POA: Diagnosis not present

## 2019-01-13 DIAGNOSIS — E782 Mixed hyperlipidemia: Secondary | ICD-10-CM | POA: Diagnosis not present

## 2019-01-13 DIAGNOSIS — M549 Dorsalgia, unspecified: Secondary | ICD-10-CM | POA: Diagnosis not present

## 2019-01-13 DIAGNOSIS — R609 Edema, unspecified: Secondary | ICD-10-CM | POA: Diagnosis not present

## 2019-02-03 NOTE — Progress Notes (Signed)
HEMATOLOGY/ONCOLOGY CLINIC NOTE  Date of Service: 02/04/2019  Patient Care Team: [name], MD as PCP - General (Family Medicine)  CHIEF COMPLAINTS/PURPOSE OF CONSULTATION:  F/u for CLL  HISTORY OF PRESENTING ILLNESS:   Benjamin Watkins is a wonderful 85 y.o. male who has been referred to [place] by his PCP, Dr. Brigitte Pulse from [place] for evaluation and management of elevated WBC.  Of note, Urine test on 11/20/16 show 2+ WBC in urine. Labs on 05/22/17 showed WBC at 18.7. PCP referred him with concern for a smoldering blood cancer. 06/26/17 Labs shows increase of WBC to 21.3.   He presents to the clinic today noting he was first told about elevated WBC in 03/2017. He notes he has felt normal overall, asymptomatic. He has had a few UTIs when he was told about his elevated in WBC. He notes to having a recent eye infection. He is still has residual infection and is continuing treatment.  He notes he has been slowly losing weight purposefully over the last 3 years as he has been watching his weight. He last about 30 pounds. In the past 6 months he lost 2-4 pounds. He lives with his wife and is able to get around well. He notes his age is starting to effect him but he is independent still. He is a currently retired Glass blower/designer.   In the past he was diagnosed with diabetes. He has never been a smoker and does not drink alcohol. He has not been on steroids. He has not received blood transfusion in the past nor has he had tattoos.   On review of symptoms, pt notes to eye infection symptoms. He denies change in breathing, SOB.  He denies abdominal pain, chills, night sweats, no new skin rashes and bums. He has swelling in his left ankle from an ongoing rash which is monitored by Dermatologist. He has occasional arthritis in his arms and knees with back problems at times. He reports to purposeful slow weight loss.    INTERVAL HISTORY:   Benjamin Watkins is here for management and  evaluation of his CLL. The patient's last visit with [place] was on 11/26/18. The pt reports that he is doing well overall.  The pt reports no new concerns. He reports eating well and denies fever, chills, night sweats. He has been staying away from crowds and has not left his house except to grocery shop or go to [employer] visits.   He has intentionally lost about 15 lbs since his last visit by eating less of some foods like bread. He has not been very physically active. Pt has had fatigue and back pain for a while but the back pain is more limiting. He notes that he is unable to mow the lawn due to the back pain. He has some knee pain due to arthritis. Denies belly pain and new skin changes.  His right leg swelling has decreased, but left leg has stayed the same. He has hyperpigmentation on his lower left leg and saw a dermatologist, but it has not improved.  Lab results today (02/04/19) of CBC w/diff and CMP is as follows: all values are WNL except for WBC at 93.6k, RBC at 3.87, HGB at 12.1, HCT at 38.8, MCV at 100.3, PLT at 137k, Lymphs abs at 81.4k, Monocytes abs at 1.9k, Eosinophils abs at 900, Sodium at 146, Glucose at 110, BUN at 26, Creatinine at 1.97, Calcium at 8.3, GFR at 31. 02/04/19 LDH at 278  On review of systems, pt reports fatigue, intentional weight loss, back pain,  left leg edema, left leg hyperpigmentation, moving his bowels well, and knee pain and denies fever, chills, night sweats, new skin changes, and any other symptoms.    MEDICAL HISTORY:  Past Medical History:  Diagnosis Date  . Diabetes mellitus without complication ([place])   . Diverticulosis   . History of colon polyps   . Hypertension   . Low back pain   . Right foot drop     SURGICAL HISTORY: Past Surgical History:  Procedure Laterality Date  . CHOLECYSTECTOMY     laparoscopic  . COLONOSCOPY WITH PROPOFOL N/A 12/10/2015   Procedure: COLONOSCOPY WITH PROPOFOL;  Surgeon: Garlan Fair, MD;  Location: [facility];   Service: Endoscopy;  Laterality: N/A;  . EYE SURGERY Bilateral    bleeding retina  . TONSILLECTOMY      SOCIAL HISTORY: Social History   Socioeconomic History  . Marital status: Married    Spouse name: Not on file  . Number of children: 2  . Years of education: some college  . Highest education level: Not on file  Occupational History  . Occupation: Retired  Scientific laboratory technician  . Financial resource strain: Not on file  . Food insecurity    Worry: Not on file    Inability: Not on file  . Transportation needs    Medical: Not on file    Non-medical: Not on file  Tobacco Use  . Smoking status: Never Smoker  . Smokeless tobacco: Never Used  Substance and Sexual Activity  . Alcohol use: No  . Drug use: No  . Sexual activity: Not on file  Lifestyle  . Physical activity    Days per week: Not on file    Minutes per session: Not on file  . Stress: Not on file  Relationships  . Social Herbalist on phone: Not on file    Gets together: Not on file    Attends religious service: Not on file    Active member of club or organization: Not on file    Attends meetings of clubs or organizations: Not on file    Relationship status: Not on file  . Intimate partner violence    Fear of current or ex partner: Not on file    Emotionally abused: Not on file    Physically abused: Not on file    Forced sexual activity: Not on file  Other Topics Concern  . Not on file  Social History Narrative   Lives at home with his wife.   Right-handed.   0.5 cup of coffee each morning, some tea.    FAMILY HISTORY: Family History  Problem Relation Age of Onset  . Stroke Mother   . Diabetes Mother   . Other Father        "old age"    ALLERGIES:  is allergic to hydrochlorothiazide and lisinopril.  MEDICATIONS:  Current Outpatient Medications  Medication Sig Dispense Refill  . acetaminophen (TYLENOL) 325 MG tablet Take 650 mg by mouth 2 (two) times daily.    Marland Kitchen aspirin EC 81 MG tablet  Take 81 mg by mouth daily.    . carvedilol (COREG) 25 MG tablet Take 25 mg by mouth 2 (two) times daily with a meal.    . chlorthalidone (HYGROTON) 25 MG tablet Take 25 mg by mouth daily.    . Cyanocobalamin (VITAMIN B-12) 1000 MCG SUBL PLACE 1  UNDER THE TONGUE ONCE  DAILY 30 each 0  . gabapentin (NEURONTIN) 300 MG capsule Take 1 capsule (300 mg total) by mouth 3 (three) times daily. 90 capsule 11  . glimepiride (AMARYL) 2 MG tablet Take 2 mg by mouth daily with breakfast.    . hydrALAZINE (APRESOLINE) 25 MG tablet Take 25 mg by mouth 2 (two) times daily.     . niacin 500 MG tablet Take 500 mg by mouth at bedtime.    Marland Kitchen NOVOLIN 70/30 RELION (70-30) 100 UNIT/ML injection Inject 70-74 Units into the skin 2 (two) times daily. Takes 74 units in the morning and 70 units at night.    Marland Kitchen omeprazole (PRILOSEC) 20 MG capsule Take 20 mg by mouth daily.    Vladimir Faster Glycol-Propyl Glycol (SYSTANE ULTRA OP) Place 2 drops into both eyes daily as needed (For dry eyes.).     No current facility-administered medications for this visit.     REVIEW OF SYSTEMS:   A 10+ POINT REVIEW OF SYSTEMS WAS OBTAINED including neurology, dermatology, psychiatry, cardiac, respiratory, lymph, extremities, GI, GU, Musculoskeletal, constitutional, breasts, reproductive, HEENT.  All pertinent positives are noted in the HPI.  All others are negative.   PHYSICAL EXAMINATION:  ECOG PERFORMANCE STATUS: 1 - Symptomatic but completely ambulatory  . Vitals:   02/04/19 1020  BP: (!) 127/46  Pulse: 78  Resp: 18  Temp: 98 F (36.7 C)  SpO2: 96%   Filed Weights   02/04/19 1020  Weight: 248 lb 12.8 oz (112.9 kg)   .Body mass index is 31.73 kg/m.  GENERAL:alert, in no acute distress and comfortable SKIN: changes of venous stasis dermatitis in left lower leg EYES: conjunctiva are pink and non-injected, sclera anicteric OROPHARYNX: MMM, no exudates, no oropharyngeal erythema or ulceration NECK: supple, no JVD LYMPH:  no  palpable lymphadenopathy in the cervical, axillary or inguinal regions LUNGS: clear to auscultation b/l with normal respiratory effort HEART: regular rate & rhythm ABDOMEN:  normoactive bowel sounds , non tender, not distended. Spleen just palpable under left costal margin. No palpable hepatomegaly Extremity: +1 right pedal edema +2 left PSYCH: alert & oriented x 3 with fluent speech NEURO: no focal motor/sensory deficits   LABORATORY DATA:  I have reviewed the data as listed  . CBC Latest Ref Rng & Units 02/04/2019 11/26/2018 07/30/2018  WBC 4.0 - 10.5 K/uL 93.6(HH) 79.7(HH) 52.0(HH)  Hemoglobin 13.0 - 17.0 g/dL 12.1(L) 12.5(L) 13.2  Hematocrit 39.0 - 52.0 % 38.8(L) 39.3 41.3  Platelets 150 - 400 K/uL 137(L) 124(L) 121(L)   . CBC    Component Value Date/Time   WBC 93.6 (HH) 02/04/2019 0927   RBC 3.87 (L) 02/04/2019 0927   HGB 12.1 (L) 02/04/2019 0927   HGB 14.2 03/25/2018 1250   HCT 38.8 (L) 02/04/2019 0927   PLT 137 (L) 02/04/2019 0927   PLT 120 (L) 03/25/2018 1250   MCV 100.3 (H) 02/04/2019 0927   MCH 31.3 02/04/2019 0927   MCHC 31.2 02/04/2019 0927   RDW 15.2 02/04/2019 0927   LYMPHSABS 81.4 (H) 02/04/2019 0927   MONOABS 1.9 (H) 02/04/2019 0927   EOSABS 0.9 (H) 02/04/2019 0927   BASOSABS 0.0 02/04/2019 0927    . CMP Latest Ref Rng & Units 02/04/2019 11/26/2018 07/30/2018  Glucose 70 - 99 mg/dL 110(H) 83 54(L)  BUN 8 - 23 mg/dL 26(H) 22 28(H)  Creatinine 0.61 - 1.24 mg/dL 1.97(H) 1.79(H) 2.10(H)  Sodium 135 - 145 mmol/L 146(H) 141 143  Potassium 3.5 - 5.1 mmol/L 4.1 4.3 4.3  Chloride 98 - 111 mmol/L 111 107 108  CO2 22 - 32 mmol/L 27 26 26   Calcium 8.9 - 10.3 mg/dL 8.3(L) 8.6(L) 9.1  Total Protein 6.5 - 8.1 g/dL 6.5 6.0(L) 6.4(L)  Total Bilirubin 0.3 - 1.2 mg/dL 0.6 0.5 0.6  Alkaline Phos 38 - 126 U/L 110 117 98  AST 15 - 41 U/L 16 15 21   ALT 0 - 44 U/L 9 9 14     . Lab Results  Component Value Date   LDH 278 (H) 02/04/2019   11/24/17 FISH CLL Prognostic:      RADIOGRAPHIC STUDIES: I have personally reviewed the radiological images as listed and agreed with the findings in the report. No results found.   ASSESSMENT & PLAN:   Benjamin Watkins is a 85 y.o. caucasian male with    1. Chronic Lymphocytic Leukemia The above w/u was reviewed and diagnosis of CLL was made based on PBS and flow cytometry results Likely Rai 0 Patient has thrombocytopenia PLT 117k (not <100k to count as staging criteria) No anemia or clinical splenomegaly 11/24/17 FISH CLL Prognostic panel did not reveal a standard mutation.  Lab Results  Component Value Date   LDH 278 (H) 02/04/2019   PLAN:  - Discussed pt labwork today, 02/04/19; WBC continuing to increase, HGB is slowly decreasing, PLT have been stable - Discussed possible stasis dermatitis on left lower leg. Recommend using compression socks and keeping the leg elevated. - Discussed indications to consider initiating treatment; no current constitutional symptoms, no overt concern for threat of organ injury at this time but will check for threat of organ injury with imaging before next visit, HGB has been slowly dropping, will continue to monitor - Recommend PET/CT or CT scan before next visit in 10 weeks - Taper weight loss, followed a balanced diet -Discussed that the pt's WBC have shown a doubling rate in the last 8 months and may indicate treatment could be considered in the next year or so -He continues to deny constitutional symptoms -PO 1025mcg Vitamin B12 every day replacement as the 04/05/18 level was low at 208 -Continue follow up with PCP regarding creatinine increase -Advised staying up to date with annual flu vaccine, and every-5-year pneumonia vaccines, which the pt notes he has done with his PCP  -Will see the pt back in 3 months  2.  Past Medical History:  Diagnosis Date  . Diabetes mellitus without complication ([place])   . Diverticulosis   . History of colon polyps   . Hypertension   . Low  back pain   . Right foot drop   -GERD, CAD, CKD stage 3, Obesity, Hyperlipidemia  -Managed by PCP   3. Left Ankle Rash and swelling -likely stasis dermatitis. -According to pt he had a Doppler done in the past with no evidence of blood clots.  -Monitored by Dermatologist    RTC with Dr [name] with labs in 3 months PET/CT in 10 weeks   All of the patients questions were answered with apparent satisfaction. The patient knows to call the clinic with any problems, questions or concerns.  The total time spent in the appt was 20 minutes and more than 50% was on counseling and direct patient cares.    Sullivan Lone MD [place] AAHIVMS [facility] Hematology/Oncology Physician [facility]  (Office):       [phone] (Work cell):  [phone] (Fax):           [phone]  02/04/2019 11:05 AM  I, Baldwin [place], am acting as a scribe for Dr. Sullivan Lone.   .I have reviewed the above documentation for accuracy and completeness, and I agree with the above. Brunetta Genera MD

## 2019-02-04 ENCOUNTER — Other Ambulatory Visit: Payer: Self-pay

## 2019-02-04 ENCOUNTER — Telehealth: Payer: Self-pay | Admitting: Emergency Medicine

## 2019-02-04 ENCOUNTER — Telehealth: Payer: Self-pay | Admitting: Hematology

## 2019-02-04 ENCOUNTER — Inpatient Hospital Stay (HOSPITAL_BASED_OUTPATIENT_CLINIC_OR_DEPARTMENT_OTHER): Payer: Medicare Other | Admitting: Hematology

## 2019-02-04 ENCOUNTER — Inpatient Hospital Stay: Payer: Medicare Other | Attending: Hematology

## 2019-02-04 VITALS — BP 127/46 | HR 78 | Temp 98.0°F | Resp 18 | Ht 74.25 in | Wt 248.8 lb

## 2019-02-04 DIAGNOSIS — Z7982 Long term (current) use of aspirin: Secondary | ICD-10-CM | POA: Diagnosis not present

## 2019-02-04 DIAGNOSIS — E119 Type 2 diabetes mellitus without complications: Secondary | ICD-10-CM | POA: Insufficient documentation

## 2019-02-04 DIAGNOSIS — R21 Rash and other nonspecific skin eruption: Secondary | ICD-10-CM | POA: Diagnosis not present

## 2019-02-04 DIAGNOSIS — Z79899 Other long term (current) drug therapy: Secondary | ICD-10-CM | POA: Insufficient documentation

## 2019-02-04 DIAGNOSIS — D696 Thrombocytopenia, unspecified: Secondary | ICD-10-CM | POA: Diagnosis not present

## 2019-02-04 DIAGNOSIS — Z794 Long term (current) use of insulin: Secondary | ICD-10-CM | POA: Diagnosis not present

## 2019-02-04 DIAGNOSIS — Z7984 Long term (current) use of oral hypoglycemic drugs: Secondary | ICD-10-CM | POA: Diagnosis not present

## 2019-02-04 DIAGNOSIS — K219 Gastro-esophageal reflux disease without esophagitis: Secondary | ICD-10-CM

## 2019-02-04 DIAGNOSIS — C9110 Chronic lymphocytic leukemia of B-cell type not having achieved remission: Secondary | ICD-10-CM | POA: Diagnosis not present

## 2019-02-04 LAB — CMP (CANCER CENTER ONLY)
ALT: 9 U/L (ref 0–44)
AST: 16 U/L (ref 15–41)
Albumin: 3.9 g/dL (ref 3.5–5.0)
Alkaline Phosphatase: 110 U/L (ref 38–126)
Anion gap: 8 (ref 5–15)
BUN: 26 mg/dL — ABNORMAL HIGH (ref 8–23)
CO2: 27 mmol/L (ref 22–32)
Calcium: 8.3 mg/dL — ABNORMAL LOW (ref 8.9–10.3)
Chloride: 111 mmol/L (ref 98–111)
Creatinine: 1.97 mg/dL — ABNORMAL HIGH (ref 0.61–1.24)
GFR, Est AFR Am: 35 mL/min — ABNORMAL LOW (ref >60)
GFR, Estimated: 31 mL/min — ABNORMAL LOW (ref >60)
Glucose, Bld: 110 mg/dL — ABNORMAL HIGH (ref 70–99)
Potassium: 4.1 mmol/L (ref 3.5–5.1)
Sodium: 146 mmol/L — ABNORMAL HIGH (ref 135–145)
Total Bilirubin: 0.6 mg/dL (ref 0.3–1.2)
Total Protein: 6.5 g/dL (ref 6.5–8.1)

## 2019-02-04 LAB — CBC WITH DIFFERENTIAL/PLATELET
Abs Immature Granulocytes: 0 10*3/uL (ref 0.00–0.07)
Basophils Absolute: 0 10*3/uL (ref 0.0–0.1)
Basophils Relative: 0 %
Eosinophils Absolute: 0.9 10*3/uL — ABNORMAL HIGH (ref 0.0–0.5)
Eosinophils Relative: 1 %
HCT: 38.8 % — ABNORMAL LOW (ref 39.0–52.0)
Hemoglobin: 12.1 g/dL — ABNORMAL LOW (ref 13.0–17.0)
Lymphocytes Relative: 87 %
Lymphs Abs: 81.4 10*3/uL — ABNORMAL HIGH (ref 0.7–4.0)
MCH: 31.3 pg (ref 26.0–34.0)
MCHC: 31.2 g/dL (ref 30.0–36.0)
MCV: 100.3 fL — ABNORMAL HIGH (ref 80.0–100.0)
Monocytes Absolute: 1.9 10*3/uL — ABNORMAL HIGH (ref 0.1–1.0)
Monocytes Relative: 2 %
Neutro Abs: 9.4 10*3/uL (ref 1.7–17.7)
Neutrophils Relative %: 10 %
Platelets: 137 10*3/uL — ABNORMAL LOW (ref 150–400)
RBC: 3.87 MIL/uL — ABNORMAL LOW (ref 4.22–5.81)
RDW: 15.2 % (ref 11.5–15.5)
WBC: 93.6 10*3/uL (ref 4.0–10.5)
nRBC: 0 % (ref 0.0–0.2)

## 2019-02-04 LAB — LACTATE DEHYDROGENASE: LDH: 278 U/L — ABNORMAL HIGH (ref 98–192)

## 2019-02-04 NOTE — Telephone Encounter (Signed)
Alerted by lab of critical WBC level of 93.6.  RN Carlyon Prows for MD [name] made aware.

## 2019-02-04 NOTE — Telephone Encounter (Signed)
Scheduled appt per 7/10 los. Printed and mailed appt calendar.

## 2019-04-05 DIAGNOSIS — H35043 Retinal micro-aneurysms, unspecified, bilateral: Secondary | ICD-10-CM | POA: Diagnosis not present

## 2019-04-05 DIAGNOSIS — E113491 Type 2 diabetes mellitus with severe nonproliferative diabetic retinopathy without macular edema, right eye: Secondary | ICD-10-CM | POA: Diagnosis not present

## 2019-04-05 DIAGNOSIS — H31009 Unspecified chorioretinal scars, unspecified eye: Secondary | ICD-10-CM | POA: Diagnosis not present

## 2019-04-05 DIAGNOSIS — E113492 Type 2 diabetes mellitus with severe nonproliferative diabetic retinopathy without macular edema, left eye: Secondary | ICD-10-CM | POA: Diagnosis not present

## 2019-04-15 ENCOUNTER — Encounter (HOSPITAL_COMMUNITY): Payer: Medicare Other

## 2019-04-15 ENCOUNTER — Other Ambulatory Visit: Payer: Self-pay

## 2019-04-18 DIAGNOSIS — E113491 Type 2 diabetes mellitus with severe nonproliferative diabetic retinopathy without macular edema, right eye: Secondary | ICD-10-CM | POA: Diagnosis not present

## 2019-04-18 DIAGNOSIS — E113492 Type 2 diabetes mellitus with severe nonproliferative diabetic retinopathy without macular edema, left eye: Secondary | ICD-10-CM | POA: Diagnosis not present

## 2019-04-25 ENCOUNTER — Other Ambulatory Visit: Payer: Self-pay

## 2019-04-25 ENCOUNTER — Ambulatory Visit (HOSPITAL_COMMUNITY)
Admission: RE | Admit: 2019-04-25 | Discharge: 2019-04-25 | Disposition: A | Discharge status: Home / Self Care | Payer: Medicare Other | Source: Ambulatory Visit | Attending: Hematology | Admitting: Hematology

## 2019-04-25 DIAGNOSIS — Z7982 Long term (current) use of aspirin: Secondary | ICD-10-CM | POA: Diagnosis not present

## 2019-04-25 DIAGNOSIS — I10 Essential (primary) hypertension: Secondary | ICD-10-CM | POA: Insufficient documentation

## 2019-04-25 DIAGNOSIS — Z79899 Other long term (current) drug therapy: Secondary | ICD-10-CM | POA: Insufficient documentation

## 2019-04-25 DIAGNOSIS — Z9049 Acquired absence of other specified parts of digestive tract: Secondary | ICD-10-CM | POA: Insufficient documentation

## 2019-04-25 DIAGNOSIS — C9110 Chronic lymphocytic leukemia of B-cell type not having achieved remission: Secondary | ICD-10-CM | POA: Insufficient documentation

## 2019-04-25 LAB — GLUCOSE, CAPILLARY: Glucose-Capillary: 142 mg/dL — ABNORMAL HIGH (ref 70–99)

## 2019-04-25 MED ORDER — FLUDEOXYGLUCOSE F - 18 (FDG) INJECTION
12.3000 | Freq: Once | INTRAVENOUS | Status: AC | PRN
  Administered 2019-04-25: 12.3 via INTRAVENOUS

## 2019-05-03 DIAGNOSIS — E113491 Type 2 diabetes mellitus with severe nonproliferative diabetic retinopathy without macular edema, right eye: Secondary | ICD-10-CM | POA: Diagnosis not present

## 2019-05-05 NOTE — Progress Notes (Signed)
HEMATOLOGY/ONCOLOGY CLINIC NOTE  Date of Service: 05/05/2019  Patient Care Team: [name], MD as PCP - General (Family Medicine)  CHIEF COMPLAINTS/PURPOSE OF CONSULTATION:  F/u for CLL  HISTORY OF PRESENTING ILLNESS:   Benjamin Watkins is a wonderful 85 y.o. male who has been referred to [place] by his PCP, Dr. Brigitte Pulse from [place] for evaluation and management of elevated WBC.  Of note, Urine test on 11/20/16 show 2+ WBC in urine. Labs on 05/22/17 showed WBC at 18.7. PCP referred him with concern for a smoldering blood cancer. 06/26/17 Labs shows increase of WBC to 21.3.   He presents to the clinic today noting he was first told about elevated WBC in 03/2017. He notes he has felt normal overall, asymptomatic. He has had a few UTIs when he was told about his elevated in WBC. He notes to having a recent eye infection. He is still has residual infection and is continuing treatment.  He notes he has been slowly losing weight purposefully over the last 3 years as he has been watching his weight. He last about 30 pounds. In the past 6 months he lost 2-4 pounds. He lives with his wife and is able to get around well. He notes his age is starting to effect him but he is independent still. He is a currently retired Glass blower/designer.   In the past he was diagnosed with diabetes. He has never been a smoker and does not drink alcohol. He has not been on steroids. He has not received blood transfusion in the past nor has he had tattoos.   On review of symptoms, pt notes to eye infection symptoms. He denies change in breathing, SOB.  He denies abdominal pain, chills, night sweats, no new skin rashes and bums. He has swelling in his left ankle from an ongoing rash which is monitored by Dermatologist. He has occasional arthritis in his arms and knees with back problems at times. He reports to purposeful slow weight loss.    INTERVAL HISTORY:   Benjamin Watkins is here for management and  evaluation of his CLL. The patient's last visit with [place] was on 02/04/2019 . The pt reports that he is doing well overall.   The pt reports that he feels fine overall. There has been no change if energy. He expressed that he would like to wait to start treatment until next year if that is an option. He states that he has compression socks but does not use them.  Of note since the patient's last visit, pt has had NM PET Image Initial (PI) Skull Base To Thigh completed on 04/25/2019 with results revealing 1. No signs of hypermetabolic process with mildly enlarged upper abdominal and retroperitoneal lymph nodes and with splenomegaly as described. 2. Findings compatible with Deauville 2 at this time. 3. Sinus disease, changes of cholecystectomy, mild atherosclerosis and spinal degenerative changes as outlined above.".  Lab results today (05/06/19) of CBC w/diff and CMP is as follows: all values are WNL except for WBC at 142.5, RBC at 3.40, Hemoglobin at 10.8, HCT at 34.3, MCV at 100.9, Platelets at 128, Lymphs Abs at 110.2, Monocytes Absolute at 25, Abs Immature Granulocytes at 0.38   -PENDING CMP -PENDING LACTATE DEHYDROGENASE.  On review of systems, pt reports leg swelling and denies fever, chills, abdominal pain, night sweats and any other symptoms.    MEDICAL HISTORY:  Past Medical History:  Diagnosis Date   Diabetes mellitus without complication ([place])  Diverticulosis    History of colon polyps    Hypertension    Low back pain    Right foot drop     SURGICAL HISTORY: Past Surgical History:  Procedure Laterality Date   CHOLECYSTECTOMY     laparoscopic   COLONOSCOPY WITH PROPOFOL N/A 12/10/2015   Procedure: COLONOSCOPY WITH PROPOFOL;  Surgeon: Garlan Fair, MD;  Location: [facility];  Service: Endoscopy;  Laterality: N/A;   EYE SURGERY Bilateral    bleeding retina   TONSILLECTOMY      SOCIAL HISTORY: Social History   Socioeconomic History   Marital status:  Married    Spouse name: Not on file   Number of children: 2   Years of education: some college   Highest education level: Not on file  Occupational History   Occupation: Retired  Scientist, product/process development strain: Not on file   Food insecurity    Worry: Not on file    Inability: Not on Lexicographer needs    Medical: Not on file    Non-medical: Not on file  Tobacco Use   Smoking status: Never Smoker   Smokeless tobacco: Never Used  Substance and Sexual Activity   Alcohol use: No   Drug use: No   Sexual activity: Not on file  Lifestyle   Physical activity    Days per week: Not on file    Minutes per session: Not on file   Stress: Not on file  Relationships   Social connections    Talks on phone: Not on file    Gets together: Not on file    Attends religious service: Not on file    Active member of club or organization: Not on file    Attends meetings of clubs or organizations: Not on file    Relationship status: Not on file   Intimate partner violence    Fear of current or ex partner: Not on file    Emotionally abused: Not on file    Physically abused: Not on file    Forced sexual activity: Not on file  Other Topics Concern   Not on file  Social History Narrative   Lives at home with his wife.   Right-handed.   0.5 cup of coffee each morning, some tea.    FAMILY HISTORY: Family History  Problem Relation Age of Onset   Stroke Mother    Diabetes Mother    Other Father        "old age"    ALLERGIES:  is allergic to hydrochlorothiazide and lisinopril.  MEDICATIONS:  Current Outpatient Medications  Medication Sig Dispense Refill   acetaminophen (TYLENOL) 325 MG tablet Take 650 mg by mouth 2 (two) times daily.     aspirin EC 81 MG tablet Take 81 mg by mouth daily.     carvedilol (COREG) 25 MG tablet Take 25 mg by mouth 2 (two) times daily with a meal.     chlorthalidone (HYGROTON) 25 MG tablet Take 25 mg by mouth daily.      Cyanocobalamin (VITAMIN B-12) 1000 MCG SUBL PLACE 1  UNDER THE TONGUE ONCE DAILY 30 each 0   gabapentin (NEURONTIN) 300 MG capsule Take 1 capsule (300 mg total) by mouth 3 (three) times daily. 90 capsule 11   glimepiride (AMARYL) 2 MG tablet Take 2 mg by mouth daily with breakfast.     hydrALAZINE (APRESOLINE) 25 MG tablet Take 25 mg by mouth 2 (two) times daily.  niacin 500 MG tablet Take 500 mg by mouth at bedtime.     NOVOLIN 70/30 RELION (70-30) 100 UNIT/ML injection Inject 70-74 Units into the skin 2 (two) times daily. Takes 74 units in the morning and 70 units at night.     omeprazole (PRILOSEC) 20 MG capsule Take 20 mg by mouth daily.     Polyethyl Glycol-Propyl Glycol (SYSTANE ULTRA OP) Place 2 drops into both eyes daily as needed (For dry eyes.).     No current facility-administered medications for this visit.     REVIEW OF SYSTEMS:   A 10+ POINT REVIEW OF SYSTEMS WAS OBTAINED including neurology, dermatology, psychiatry, cardiac, respiratory, lymph, extremities, GI, GU, Musculoskeletal, constitutional, breasts, reproductive, HEENT.  All pertinent positives are noted in the HPI.  All others are negative.    PHYSICAL EXAMINATION:  ECOG FS:2 - Symptomatic, <50% confined to bed  .BP (!) 127/56 (BP Location: Left Arm, Patient Position: Sitting)    Pulse 80    Temp 98.3 F (36.8 C) (Temporal)    Resp 18    Ht 6' 2.25" (1.886 m)    Wt 257 lb 11.2 oz (116.9 kg)    SpO2 97%    BMI 32.86 kg/m   There were no vitals filed for this visit. Wt Readings from Last 3 Encounters:  02/04/19 248 lb 12.8 oz (112.9 kg)  11/26/18 263 lb 6.4 oz (119.5 kg)  07/30/18 256 lb (116.1 kg)   There is no height or weight on file to calculate BMI.    GENERAL:alert, in no acute distress and comfortable SKIN: no acute rashes, no significant lesions EYES: conjunctiva are pink and non-injected, sclera anicteric OROPHARYNX: MMM, no exudates, no oropharyngeal erythema or ulceration NECK:  supple, no JVD LYMPH:  no palpable lymphadenopathy in the cervical, axillary or inguinal regions LUNGS: clear to auscultation b/l with normal respiratory effort HEART: regular rate & rhythm ABDOMEN:  normoactive bowel sounds , non tender, not distended spleen 3 finger breathe along the left castle margin. Extremity: same as last time  PSYCH: alert & oriented x 3 with fluent speech NEURO: no focal motor/sensory deficits  LABORATORY DATA:  I have reviewed the data as listed  . CBC Latest Ref Rng & Units 05/06/2019 02/04/2019 11/26/2018  WBC 4.0 - 10.5 K/uL 142.5(HH) 93.6(HH) 79.7(HH)  Hemoglobin 13.0 - 17.0 g/dL 10.8(L) 12.1(L) 12.5(L)  Hematocrit 39.0 - 52.0 % 34.3(L) 38.8(L) 39.3  Platelets 150 - 400 K/uL 128(L) 137(L) 124(L)   . CBC    Component Value Date/Time   WBC 142.5 (HH) 05/06/2019 1024   RBC 3.40 (L) 05/06/2019 1024   HGB 10.8 (L) 05/06/2019 1024   HGB 14.2 03/25/2018 1250   HCT 34.3 (L) 05/06/2019 1024   PLT 128 (L) 05/06/2019 1024   PLT 120 (L) 03/25/2018 1250   MCV 100.9 (H) 05/06/2019 1024   MCH 31.8 05/06/2019 1024   MCHC 31.5 05/06/2019 1024   RDW 15.2 05/06/2019 1024   LYMPHSABS 110.2 (H) 05/06/2019 1024   MONOABS 25.0 (H) 05/06/2019 1024   EOSABS 0.3 05/06/2019 1024   BASOSABS 0.1 05/06/2019 1024    . CMP Latest Ref Rng & Units 05/06/2019 02/04/2019 11/26/2018  Glucose 70 - 99 mg/dL 69(L) 110(H) 83  BUN 8 - 23 mg/dL 30(H) 26(H) 22  Creatinine 0.61 - 1.24 mg/dL 1.92(H) 1.97(H) 1.79(H)  Sodium 135 - 145 mmol/L 143 146(H) 141  Potassium 3.5 - 5.1 mmol/L 4.5 4.1 4.3  Chloride 98 - 111 mmol/L 106 111 107  CO2 22 - 32 mmol/L 29 27 26   Calcium 8.9 - 10.3 mg/dL 8.6(L) 8.3(L) 8.6(L)  Total Protein 6.5 - 8.1 g/dL 6.2(L) 6.5 6.0(L)  Total Bilirubin 0.3 - 1.2 mg/dL 0.5 0.6 0.5  Alkaline Phos 38 - 126 U/L 122 110 117  AST 15 - 41 U/L 15 16 15   ALT 0 - 44 U/L 11 9 9     . Lab Results  Component Value Date   LDH 281 (H) 05/06/2019   11/24/17 FISH CLL  Prognostic:     RADIOGRAPHIC STUDIES: I have personally reviewed the radiological images as listed and agreed with the findings in the report. Nm Pet Image Initial (pi) Skull Base To Thigh  Result Date: 04/25/2019 CLINICAL DATA:  Initial treatment strategy for CLL. EXAM: NUCLEAR MEDICINE PET SKULL BASE TO THIGH TECHNIQUE: Well 0.3 mCi F-18 FDG was injected intravenously. Full-ring PET imaging was performed from the skull base to thigh after the radiotracer. CT data was obtained and used for attenuation correction and anatomic localization. Fasting blood glucose: 142 mg/dl COMPARISON:  None FINDINGS: Mediastinal blood pool activity: SUV max 4.0 Liver activity: SUV max 3.2 NECK: No hypermetabolic lymph nodes in the neck. Incidental CT findings: Small sinus disease bilaterally in the maxillary sinuses. Substernal extension of thyroid on the left. Degenerative changes in the spine. CHEST: No hypermetabolic mediastinal or hilar nodes. No suspicious pulmonary nodules on the CT scan. Incidental CT findings: Substernal extension of the left thyroid as described. No signs of adenopathy by size criteria in the chest. Lung parenchyma assessment mildly limited secondary to respiratory motion. Calcified coronary disease and scattered atherosclerosis. No pericardial effusion. ABDOMEN/PELVIS: No abnormal hypermetabolic activity within the liver, pancreas, adrenal glands, or spleen. No hypermetabolic lymph nodes in the abdomen or pelvis. There are some lymph nodes in the upper abdomen that are enlarged, mildly so for example, on image 115 of the CT images a 1.3 by 2.1 cm lymph node is present in the celiac distribution. Maximum SUV 2.5. Mildly enlarged preaortic lymph node at 1.5 x 2.6 cm with maximum SUV of 2.4 is also noted. Mildly enlarged lymph nodes within the small bowel mesentery as well. Incidental CT findings: Spleen is enlarged measuring nearly 18 cm in greatest craniocaudal dimension. Tiny hypodensity in  cephalad liver likely small cysts. Post cholecystectomy. Pancreas and adrenal glands are normal. No signs of focal renal lesion on noncontrast scan. No hydronephrosis. Mildly enlarged lymph nodes throughout upper abdomen and retroperitoneum. No signs of acute process associated with the gastrointestinal tract. Prostatomegaly with scattered pelvic lymph nodes none showing hypermetabolic characteristics and none displaying pathologic enlargement. SKELETON: No hypermetabolic foci within the visualized bones. Incidental CT findings: Multilevel spinal degenerative changes throughout the cervical, thoracic and lumbar spine. No signs of acute or destructive bone process. IMPRESSION: 1. No signs of hypermetabolic process with mildly enlarged upper abdominal and retroperitoneal lymph nodes and with splenomegaly as described. 2. Findings compatible with Deauville 2 at this time. 3. Sinus disease, changes of cholecystectomy, mild atherosclerosis and spinal degenerative changes as outlined above. Electronically Signed   By: Zetta Bills M.D.   On: 04/25/2019 12:54     ASSESSMENT & PLAN:   Benjamin Watkins is a 85 y.o. caucasian male with    1. Chronic Lymphocytic Leukemia The above w/u was reviewed and diagnosis of CLL was made based on PBS and flow cytometry results Likely Rai 0 Patient has thrombocytopenia PLT 117k (not <100k to count as staging criteria) No anemia or clinical splenomegaly  11/24/17 FISH CLL Prognostic panel did not reveal a standard mutation.  Lab Results  Component Value Date   LDH 278 (H) 02/04/2019   PLAN:  -Discussed pt labwork today, 05/06/19;  all values are WNL except for WBC at 142.5, RBC at 3.40, Hemoglobin at 10.8, HCT at 34.3, MCV at 100.9, Platelets at 128, Lymphs Abs at 110.2, Monocytes Absolute at 25, Abs Immature Granulocytes at 0.38   -PENDING CMP -PENDING LACTATE DEHYDROGENASE -Discussed that WBC has doubled in the past 3-6 months and other results from blood test  suggests starting treatment soon  -Advised that Ct reveled an enlarged spleen -Discussed that CLL is causing an enlarged spleen and appearance of anemia. The decision to start treatment or to continue to monitor is up to the pt. - Discussed the options for treatment: targeted therapy (pill venetoclax) with once or twice weekly blood test in the beginning. Second option combine venetoclax with an IV antibody (however recommended to wait for this option to try to avoid excessive immune supppression -He would like to begin treatment starting with Venetoclax   FOLLOW UP: Will setup labs and venetoclax start in next few weeks   The total time spent in the appt was 25 minutes and more than 50% was on counseling and direct patient cares.  All of the patient's questions were answered with apparent satisfaction. The patient knows to call the clinic with any problems, questions or concerns.    Sullivan Lone MD MS AAHIVMS [facility] Hematology/Oncology Physician [facility]  (Office):       [phone] (Work cell):  [phone] (Fax):           [phone]  05/05/2019 9:18 PM  I, Scot Dock, am acting as a scribe for Dr. Sullivan Lone.   .I have reviewed the above documentation for accuracy and completeness, and I agree with the above.  Brunetta Genera MD

## 2019-05-06 ENCOUNTER — Inpatient Hospital Stay: Payer: Medicare Other | Attending: Hematology

## 2019-05-06 ENCOUNTER — Inpatient Hospital Stay (HOSPITAL_BASED_OUTPATIENT_CLINIC_OR_DEPARTMENT_OTHER): Payer: Medicare Other | Admitting: Hematology

## 2019-05-06 ENCOUNTER — Other Ambulatory Visit: Payer: Self-pay

## 2019-05-06 VITALS — BP 127/56 | HR 80 | Temp 98.3°F | Resp 18 | Ht 74.25 in | Wt 257.7 lb

## 2019-05-06 DIAGNOSIS — M7989 Other specified soft tissue disorders: Secondary | ICD-10-CM | POA: Diagnosis not present

## 2019-05-06 DIAGNOSIS — C9110 Chronic lymphocytic leukemia of B-cell type not having achieved remission: Secondary | ICD-10-CM

## 2019-05-06 DIAGNOSIS — Z823 Family history of stroke: Secondary | ICD-10-CM | POA: Insufficient documentation

## 2019-05-06 DIAGNOSIS — Z79899 Other long term (current) drug therapy: Secondary | ICD-10-CM | POA: Insufficient documentation

## 2019-05-06 DIAGNOSIS — R161 Splenomegaly, not elsewhere classified: Secondary | ICD-10-CM | POA: Insufficient documentation

## 2019-05-06 DIAGNOSIS — D696 Thrombocytopenia, unspecified: Secondary | ICD-10-CM | POA: Insufficient documentation

## 2019-05-06 DIAGNOSIS — I7090 Unspecified atherosclerosis: Secondary | ICD-10-CM | POA: Diagnosis not present

## 2019-05-06 DIAGNOSIS — D649 Anemia, unspecified: Secondary | ICD-10-CM

## 2019-05-06 DIAGNOSIS — Z833 Family history of diabetes mellitus: Secondary | ICD-10-CM | POA: Diagnosis not present

## 2019-05-06 LAB — CBC WITH DIFFERENTIAL/PLATELET
Abs Immature Granulocytes: 0.38 10*3/uL — ABNORMAL HIGH (ref 0.00–0.07)
Basophils Absolute: 0.1 10*3/uL (ref 0.0–0.1)
Basophils Relative: 0 %
Eosinophils Absolute: 0.3 10*3/uL (ref 0.0–0.5)
Eosinophils Relative: 0 %
HCT: 34.3 % — ABNORMAL LOW (ref 39.0–52.0)
Hemoglobin: 10.8 g/dL — ABNORMAL LOW (ref 13.0–17.0)
Immature Granulocytes: 0 %
Lymphocytes Relative: 77 %
Lymphs Abs: 110.2 10*3/uL — ABNORMAL HIGH (ref 0.7–4.0)
MCH: 31.8 pg (ref 26.0–34.0)
MCHC: 31.5 g/dL (ref 30.0–36.0)
MCV: 100.9 fL — ABNORMAL HIGH (ref 80.0–100.0)
Monocytes Absolute: 25 10*3/uL — ABNORMAL HIGH (ref 0.1–1.0)
Monocytes Relative: 18 %
Neutro Abs: 6.6 10*3/uL (ref 1.7–7.7)
Neutrophils Relative %: 5 %
Platelets: 128 10*3/uL — ABNORMAL LOW (ref 150–400)
RBC: 3.4 MIL/uL — ABNORMAL LOW (ref 4.22–5.81)
RDW: 15.2 % (ref 11.5–15.5)
WBC: 142.5 10*3/uL (ref 4.0–10.5)
nRBC: 0 % (ref 0.0–0.2)

## 2019-05-06 LAB — CMP (CANCER CENTER ONLY)
ALT: 11 U/L (ref 0–44)
AST: 15 U/L (ref 15–41)
Albumin: 3.9 g/dL (ref 3.5–5.0)
Alkaline Phosphatase: 122 U/L (ref 38–126)
Anion gap: 8 (ref 5–15)
BUN: 30 mg/dL — ABNORMAL HIGH (ref 8–23)
CO2: 29 mmol/L (ref 22–32)
Calcium: 8.6 mg/dL — ABNORMAL LOW (ref 8.9–10.3)
Chloride: 106 mmol/L (ref 98–111)
Creatinine: 1.92 mg/dL — ABNORMAL HIGH (ref 0.61–1.24)
GFR, Est AFR Am: 36 mL/min — ABNORMAL LOW (ref >60)
GFR, Estimated: 31 mL/min — ABNORMAL LOW (ref >60)
Glucose, Bld: 69 mg/dL — ABNORMAL LOW (ref 70–99)
Potassium: 4.5 mmol/L (ref 3.5–5.1)
Sodium: 143 mmol/L (ref 135–145)
Total Bilirubin: 0.5 mg/dL (ref 0.3–1.2)
Total Protein: 6.2 g/dL — ABNORMAL LOW (ref 6.5–8.1)

## 2019-05-06 LAB — LACTATE DEHYDROGENASE: LDH: 281 U/L — ABNORMAL HIGH (ref 98–192)

## 2019-05-09 ENCOUNTER — Telehealth: Payer: Self-pay | Admitting: Hematology

## 2019-05-09 NOTE — Telephone Encounter (Signed)
No los per 10/9.

## 2019-05-13 DIAGNOSIS — Z23 Encounter for immunization: Secondary | ICD-10-CM | POA: Diagnosis not present

## 2019-06-01 DIAGNOSIS — E113492 Type 2 diabetes mellitus with severe nonproliferative diabetic retinopathy without macular edema, left eye: Secondary | ICD-10-CM | POA: Diagnosis not present

## 2019-06-14 DIAGNOSIS — E113491 Type 2 diabetes mellitus with severe nonproliferative diabetic retinopathy without macular edema, right eye: Secondary | ICD-10-CM | POA: Diagnosis not present

## 2019-07-09 IMAGING — MR MR HEAD WO/W CM
13 series · 48 of 48 positions shown · IV contrast (20ml Multihance)
Comparison: 01/11/2005

CLINICAL DATA: Right foot drop for 5 days. Bilateral arm weakness.
History of chronic lymphocytic leukemia.

Creatinine was obtained on site at [HOSPITAL] at [HOSPITAL].
Results: Creatinine 1.4 mg/dL.
EXAM:
MRI HEAD WITHOUT AND WITH CONTRAST
TECHNIQUE: Multiplanar, multiecho pulse sequences of the brain and surrounding
structures were obtained without and with intravenous contrast.
CONTRAST:  20mL MULTIHANCE GADOBENATE DIMEGLUMINE 529 MG/ML IV SOLN

[Series 4: t1_se_sag · sagittal · 5.0mm · 0.45mm/px · 1 of 21 slices shown (1 of 2)]
[im 1/21]
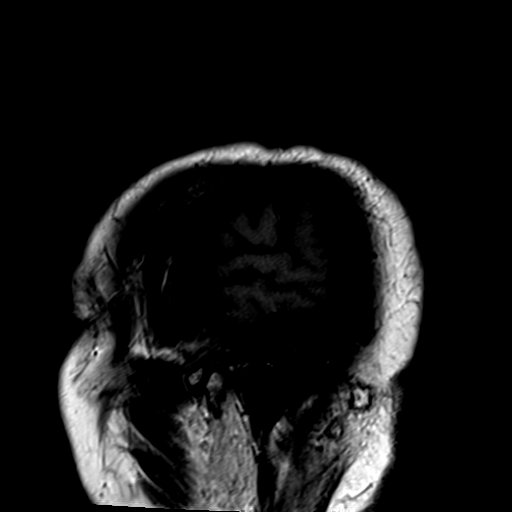

[Series 5: t2_tse_tra_512 · axial · 5.0mm · 0.60mm/px · z∈[-61,+76]mm · 2 of 24 slices shown]
[im 1/24]
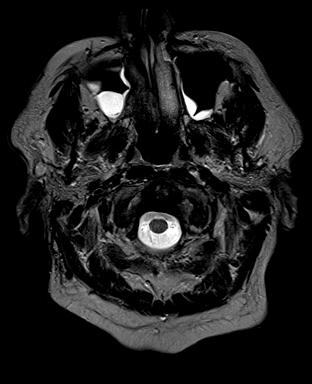
[im 24/24]
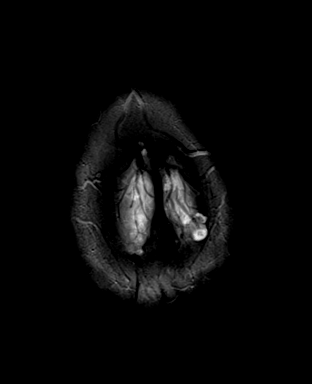

[Series 6: ep2d_diff_(id)_trace · axial · 3.0mm · 1.80mm/px · z∈[-64,+76]mm · 6 of 95 slices shown]
[im 1/95]
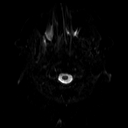
[im 19/95]
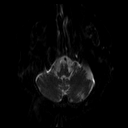
[im 38/95]
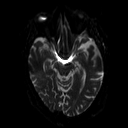
[im 57/95]
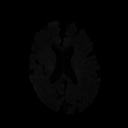
[im 76/95]
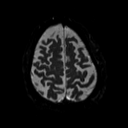
[im 95/95]
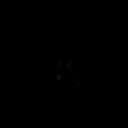

[Series 7: ep2d_diff_(id)_trace_adc · axial · 3.0mm · 1.80mm/px · z∈[-64,+76]mm · 3 of 48 slices shown]
[im 1/48]
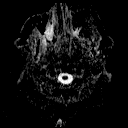
[im 24/48]
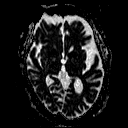
[im 48/48]
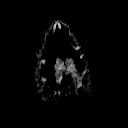

[Series 8: ep2d_diff_cor · coronal · 5.0mm · 1.77mm/px · 4 of 56 slices shown]
[im 1/56]
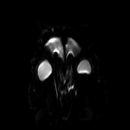
[im 19/56]
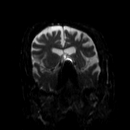
[im 37/56]
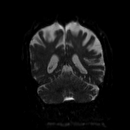
[im 56/56]
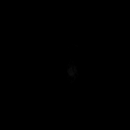

[Series 9: ep2d_diff_cor_adc · coronal · 5.0mm · 1.77mm/px · 2 of 28 slices shown]
[im 1/28]
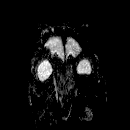
[im 28/28]
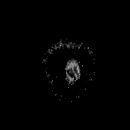

[Series 11: swi_images · axial · 4.0mm · 0.90mm/px · z∈[-70,+85]mm · 3 of 40 slices shown]
[im 1/40]
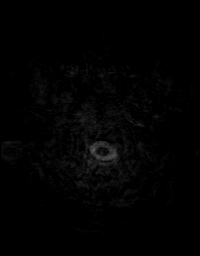
[im 20/40]
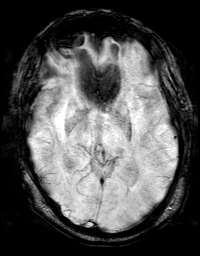
[im 40/40]
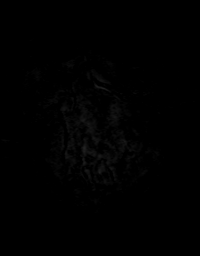

[Series 12: FLAIR · axial · 3.0mm · 0.45mm/px · z∈[-68,+79]mm · 2 of 30 slices shown]
[im 1/30]
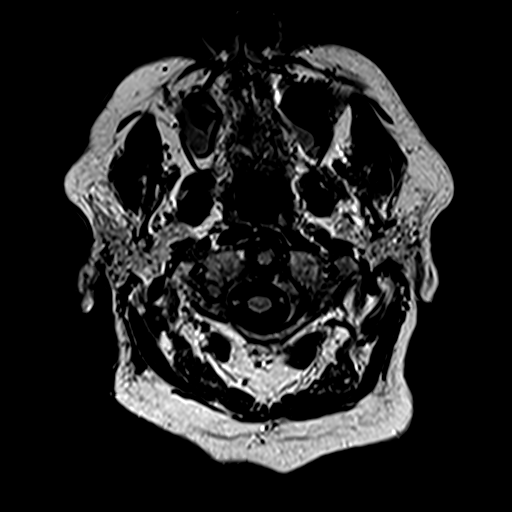
[im 30/30]
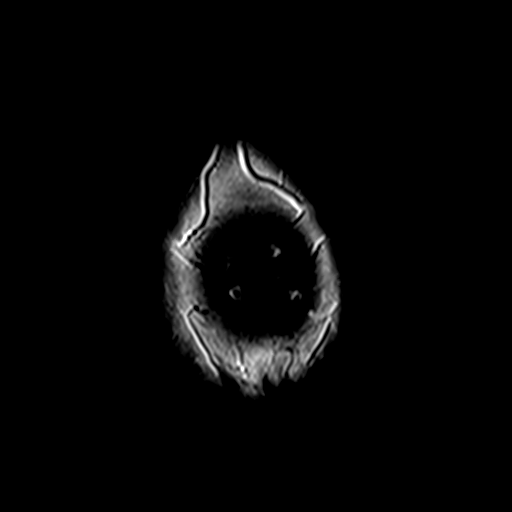

[Series 13: t1_mpr_tra · axial · 1.0mm · 0.72mm/px · z∈[-67,+82]mm · 10 of 144 slices shown]
[im 1/144]
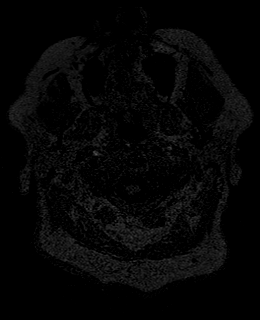
[im 16/144]
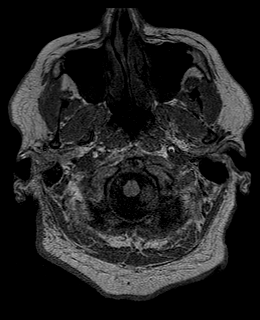
[im 32/144]
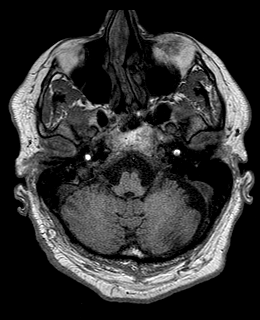
[im 48/144]
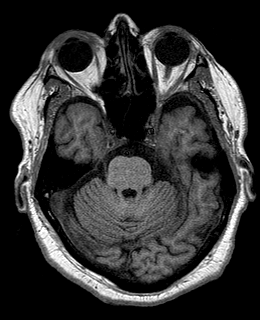
[im 64/144]
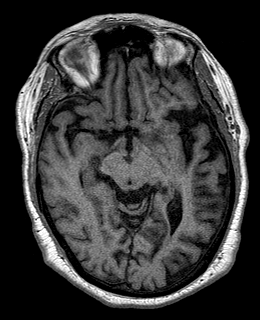
[im 80/144]
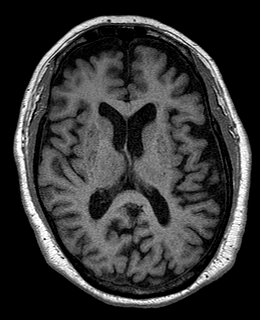
[im 96/144]
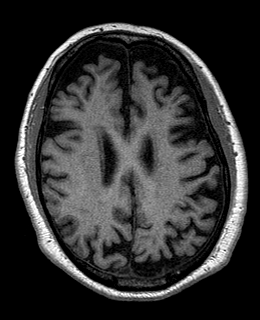
[im 112/144]
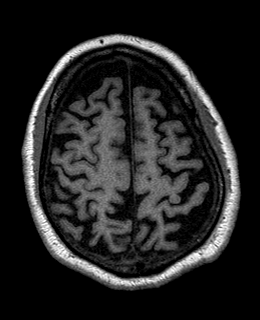
[im 128/144]
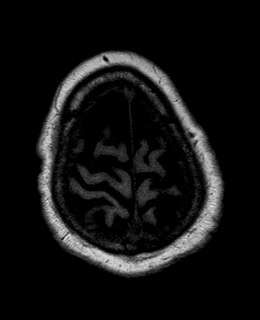
[im 144/144]
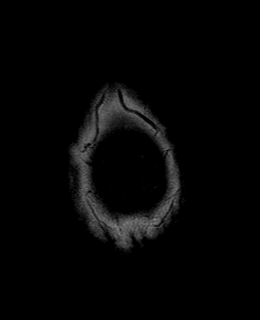

[Series 14: T2 · coronal · 5.0mm · 0.45mm/px · 2 of 28 slices shown]
[im 1/28]
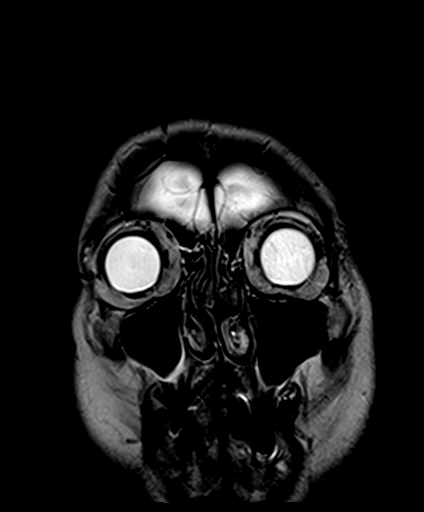
[im 28/28]
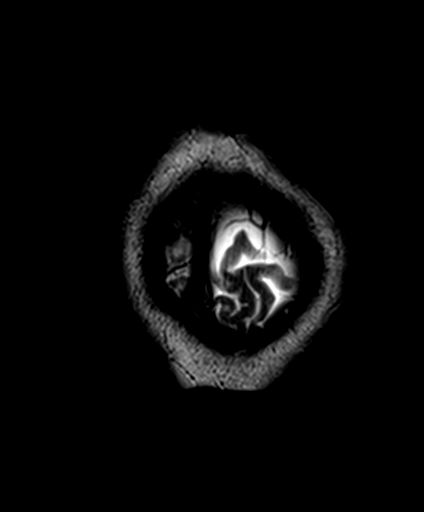

[Series 15: post t1_mpr_tra · axial · 1.0mm · 0.72mm/px · z∈[-67,+82]mm · 10 of 144 slices shown]
[im 1/144]
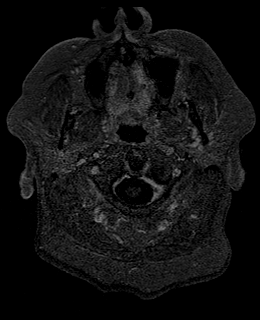
[im 16/144]
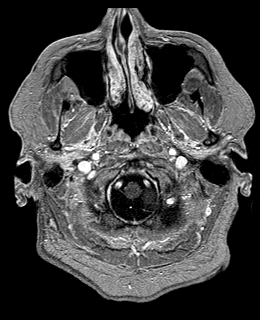
[im 32/144]
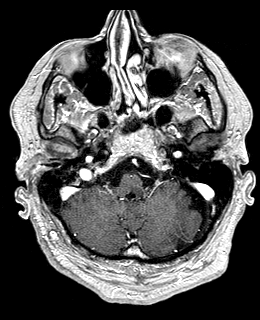
[im 48/144]
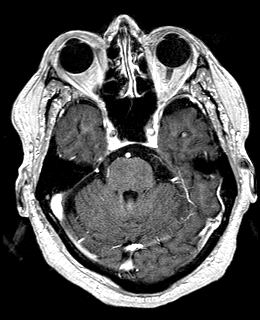
[im 64/144]
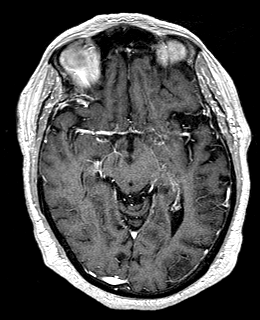
[im 80/144]
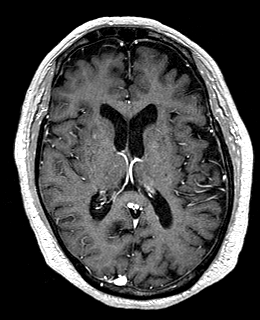
[im 96/144]
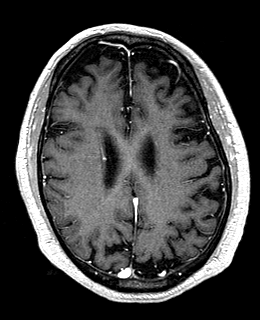
[im 112/144]
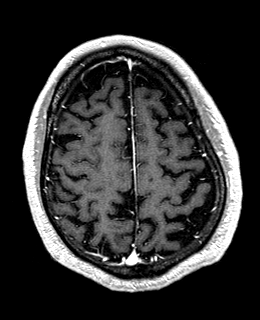
[im 128/144]
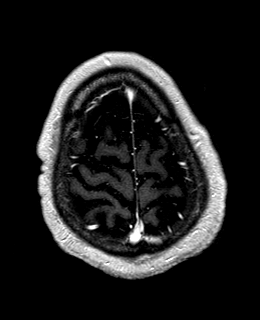
[im 144/144]
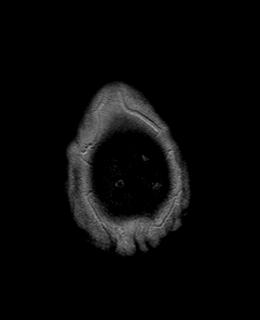

[Series 16: T1 post-contrast · coronal · 5.0mm · 0.45mm/px · 2 of 28 slices shown]
[im 1/28]
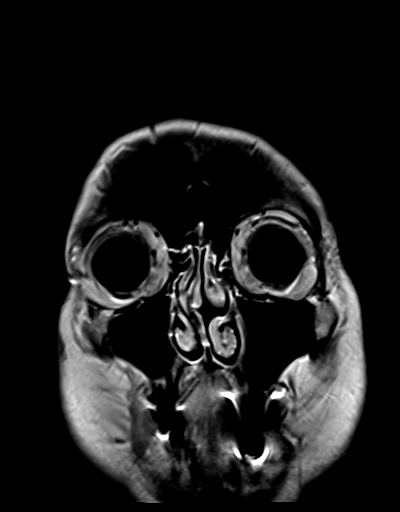
[im 28/28]
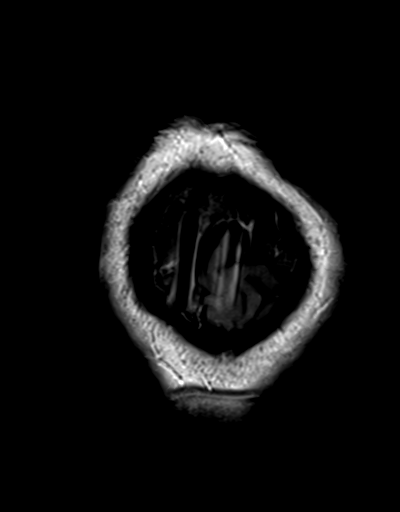

[Series 17: t1_se_sag · sagittal · 5.0mm · 0.45mm/px · 1 of 21 slices shown (2 of 2)]
[im 1/21]
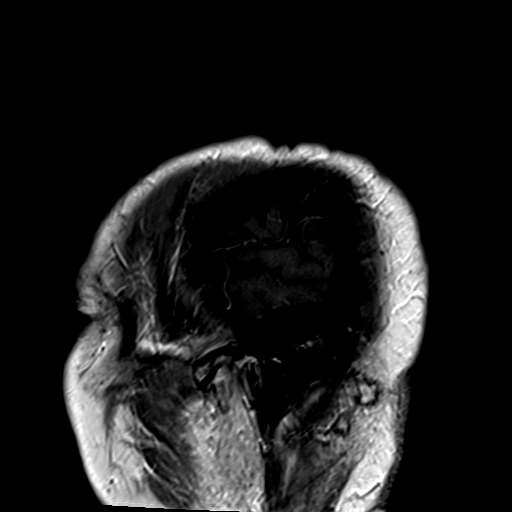

[48 of 48 positions shown; findings below may reference images not displayed]

FINDINGS: Brain: There is no evidence of acute infarct, intracranial
hemorrhage, mass, midline shift, or extra-axial fluid collection.
Small foci of T2 hyperintensity scattered throughout the cerebral
white matter bilaterally have progressed from the prior study and
are nonspecific but compatible with chronic small vessel ischemic
disease, mild for age. A small chronic left frontal lobe cortical
infarct is new. There are also new small chronic infarcts in both
cerebellar hemispheres. Moderate cerebral atrophy has mildly
progressed. No abnormal enhancement is identified.

Vascular: Major intracranial vascular flow voids are preserved.

Skull and upper cervical spine: Unremarkable bone marrow signal.

Sinuses/Orbits: Bilateral cataract extraction. Mild bilateral
ethmoid and maxillary sinus mucosal thickening. Small right
maxillary sinus mucous retention cyst. Small right and trace left
mastoid effusions.

Other: None.
IMPRESSION: 1. No acute intracranial abnormality or mass.
2. Mildly progressive chronic small vessel ischemia and cerebral
atrophy as above.

## 2019-08-10 DIAGNOSIS — E1122 Type 2 diabetes mellitus with diabetic chronic kidney disease: Secondary | ICD-10-CM | POA: Diagnosis not present

## 2019-08-18 ENCOUNTER — Other Ambulatory Visit: Payer: Self-pay | Admitting: Hematology

## 2019-08-18 MED ORDER — VENETOCLAX 10 & 50 & 100 MG PO TBPK: ORAL_TABLET | ORAL | 0 refills | Status: DC

## 2019-08-19 ENCOUNTER — Telehealth: Payer: Self-pay | Admitting: *Deleted

## 2019-08-19 ENCOUNTER — Telehealth: Payer: Self-pay

## 2019-08-19 ENCOUNTER — Telehealth: Payer: Self-pay | Admitting: Pharmacist

## 2019-08-19 DIAGNOSIS — C9110 Chronic lymphocytic leukemia of B-cell type not having achieved remission: Secondary | ICD-10-CM

## 2019-08-19 NOTE — Telephone Encounter (Signed)
Patient contacted office - left voice mail: In October, Dr. [name] had discussed starting a new medication for his CLL this month. He wanted to know when it would start. Dr. [name] informed of question. Dr. [name] response - Please inform patient: Venetoclax ordered for patient. [place] Oral chemo pharmacist notified to begin authorization process.  Contacted patient and informed him to expect call from pharmacy with medication information and instructions. He verbalized understanding

## 2019-08-19 NOTE — Telephone Encounter (Signed)
Oral Oncology Patient Advocate Encounter  Received notification from [facility] that prior authorization for Venclexta is required.  PA submitted on CoverMyMeds Key [id number] Status is pending  Oral Oncology Clinic will continue to follow.  [place] Patient [place] Phone [phone] Fax [phone] 08/19/2019 9:13 AM

## 2019-08-19 NOTE — Telephone Encounter (Signed)
Oral Oncology Patient Advocate Encounter  Prior Authorization for Lynita Lombard has been approved.    PA# [id number] Effective dates: 08/19/19 through 02/15/20  Patients co-pay is $953.12  Oral Oncology Clinic will continue to follow.   [place] Patient [place] Phone [phone] Fax [phone] 08/19/2019 9:14 AM

## 2019-08-22 ENCOUNTER — Telehealth: Payer: Self-pay | Admitting: Pharmacist

## 2019-08-22 NOTE — Telephone Encounter (Signed)
Oral Chemotherapy Pharmacist Encounter  Successfully enrolled patient for copayment assistance funds from [facility] from the [place].  Award amount: $8,700  Effective dates: 05/24/2019 - 08/20/2020 Member ID: [id number] Group ID: [id number] RxBin ID: [id number] PCN: [id number]  Billing information will be shared with [place]. I will place a copy of the award letter to be scanned into patient's chart.  Darl Pikes, PharmD, BCPS Hematology/Oncology Clinical Pharmacist [facility] [phone]  08/22/2019 2:33 PM

## 2019-08-22 NOTE — Telephone Encounter (Signed)
Oral Oncology Student Pharmacist Encounter  Received new prescription for Venclexta (Venetoclax) for the treatment of CLL.  Planned duration until disease progression or unacceptable toxicity.  CBC/CMP/LDH from 05/06/2019 assessed. all values are WNL except for WBC at 142.5, RBC at 3.40, Hemoglobin at 10.8, HCT at 34.3, MCV at 100.9, Platelets at 128, Lymphs Abs at 110.2, Monocytes Absolute at 25, Abs Immature Granulocytes at 0.38, LDH at 281, BG at 69, BUN at 30, and Scr at 1.92. Provider plans to obtain new labs prior to Venetoclax initiation.  Prescription dose and frequency assessed.   Current medication list in Epic reviewed, 1 relevant DDIs with Venetoclax identified: -Venetoclax concentration may be increased when given with Carvedilol. Recommendation to dose modify Venetoclax.  Prescription has been e-scribed to the [facility] for benefits analysis and approval.  Oral Oncology Clinic will continue to follow for insurance authorization, copayment issues, initial counseling and start date.  Ned Clines PharmD Candidate, Class of 2021  08/22/2019 10:17 AM

## 2019-08-23 ENCOUNTER — Other Ambulatory Visit: Payer: Self-pay

## 2019-08-23 ENCOUNTER — Inpatient Hospital Stay: Payer: Medicare Other | Attending: Hematology | Admitting: Hematology

## 2019-08-23 ENCOUNTER — Other Ambulatory Visit: Payer: Self-pay | Admitting: *Deleted

## 2019-08-23 ENCOUNTER — Inpatient Hospital Stay: Payer: Medicare Other

## 2019-08-23 VITALS — BP 108/46 | HR 73 | Temp 98.2°F | Resp 18 | Ht 74.5 in | Wt 233.9 lb

## 2019-08-23 DIAGNOSIS — C9110 Chronic lymphocytic leukemia of B-cell type not having achieved remission: Secondary | ICD-10-CM

## 2019-08-23 DIAGNOSIS — M170 Bilateral primary osteoarthritis of knee: Secondary | ICD-10-CM | POA: Insufficient documentation

## 2019-08-23 DIAGNOSIS — Z833 Family history of diabetes mellitus: Secondary | ICD-10-CM | POA: Insufficient documentation

## 2019-08-23 DIAGNOSIS — R609 Edema, unspecified: Secondary | ICD-10-CM | POA: Diagnosis not present

## 2019-08-23 DIAGNOSIS — D696 Thrombocytopenia, unspecified: Secondary | ICD-10-CM

## 2019-08-23 DIAGNOSIS — Z823 Family history of stroke: Secondary | ICD-10-CM | POA: Diagnosis not present

## 2019-08-23 DIAGNOSIS — R21 Rash and other nonspecific skin eruption: Secondary | ICD-10-CM | POA: Insufficient documentation

## 2019-08-23 DIAGNOSIS — Z79899 Other long term (current) drug therapy: Secondary | ICD-10-CM | POA: Diagnosis not present

## 2019-08-23 DIAGNOSIS — M1909 Primary osteoarthritis, other specified site: Secondary | ICD-10-CM | POA: Insufficient documentation

## 2019-08-23 DIAGNOSIS — M21371 Foot drop, right foot: Secondary | ICD-10-CM | POA: Insufficient documentation

## 2019-08-23 DIAGNOSIS — D649 Anemia, unspecified: Secondary | ICD-10-CM | POA: Diagnosis not present

## 2019-08-23 DIAGNOSIS — E119 Type 2 diabetes mellitus without complications: Secondary | ICD-10-CM | POA: Diagnosis present

## 2019-08-23 LAB — CBC WITH DIFFERENTIAL (CANCER CENTER ONLY)
Abs Immature Granulocytes: 0.51 10*3/uL — ABNORMAL HIGH (ref 0.00–0.07)
Basophils Absolute: 0.1 10*3/uL (ref 0.0–0.1)
Basophils Relative: 0 %
Eosinophils Absolute: 0.3 10*3/uL (ref 0.0–0.5)
Eosinophils Relative: 0 %
HCT: 31.5 % — ABNORMAL LOW (ref 39.0–52.0)
Hemoglobin: 9.7 g/dL — ABNORMAL LOW (ref 13.0–17.0)
Immature Granulocytes: 0 %
Lymphocytes Relative: 85 %
Lymphs Abs: 154.8 10*3/uL — ABNORMAL HIGH (ref 0.7–4.0)
MCH: 31.8 pg (ref 26.0–34.0)
MCHC: 30.8 g/dL (ref 30.0–36.0)
MCV: 103.3 fL — ABNORMAL HIGH (ref 80.0–100.0)
Monocytes Absolute: 22.2 10*3/uL — ABNORMAL HIGH (ref 0.1–1.0)
Monocytes Relative: 12 %
Neutro Abs: 6.4 10*3/uL (ref 1.7–7.7)
Neutrophils Relative %: 3 %
Platelet Count: 123 10*3/uL — ABNORMAL LOW (ref 150–400)
RBC: 3.05 MIL/uL — ABNORMAL LOW (ref 4.22–5.81)
RDW: 16 % — ABNORMAL HIGH (ref 11.5–15.5)
WBC Count: 184.3 10*3/uL (ref 4.0–10.5)
nRBC: 0 % (ref 0.0–0.2)

## 2019-08-23 LAB — CMP (CANCER CENTER ONLY)
ALT: 12 U/L (ref 0–44)
AST: 15 U/L (ref 15–41)
Albumin: 3.8 g/dL (ref 3.5–5.0)
Alkaline Phosphatase: 110 U/L (ref 38–126)
Anion gap: 9 (ref 5–15)
BUN: 33 mg/dL — ABNORMAL HIGH (ref 8–23)
CO2: 25 mmol/L (ref 22–32)
Calcium: 8.7 mg/dL — ABNORMAL LOW (ref 8.9–10.3)
Chloride: 108 mmol/L (ref 98–111)
Creatinine: 2 mg/dL — ABNORMAL HIGH (ref 0.61–1.24)
GFR, Est AFR Am: 34 mL/min — ABNORMAL LOW (ref >60)
GFR, Estimated: 30 mL/min — ABNORMAL LOW (ref >60)
Glucose, Bld: 109 mg/dL — ABNORMAL HIGH (ref 70–99)
Potassium: 4.7 mmol/L (ref 3.5–5.1)
Sodium: 142 mmol/L (ref 135–145)
Total Bilirubin: 0.6 mg/dL (ref 0.3–1.2)
Total Protein: 6.1 g/dL — ABNORMAL LOW (ref 6.5–8.1)

## 2019-08-23 LAB — URIC ACID: Uric Acid, Serum: 8.2 mg/dL (ref 3.7–8.6)

## 2019-08-23 LAB — LACTATE DEHYDROGENASE: LDH: 256 U/L — ABNORMAL HIGH (ref 98–192)

## 2019-08-23 MED ORDER — VENETOCLAX 10 MG PO TABS: 20.0000 mg | ORAL_TABLET | Freq: Every day | ORAL | 0 refills | Status: DC

## 2019-08-23 MED ORDER — VENETOCLAX 10 & 50 & 100 MG PO TBPK: ORAL_TABLET | ORAL | 0 refills | Status: DC

## 2019-08-23 MED FILL — VENCLEXTA 10 MG TABS: 10 | 7 days supply | Qty: 14 | Fill #0

## 2019-08-23 MED FILL — VENCLEXTA STARTING PACK: 10 & 50 & 1 | 28 days supply | Qty: 42 | Fill #0

## 2019-08-23 NOTE — Progress Notes (Signed)
HEMATOLOGY/ONCOLOGY CLINIC NOTE  Date of Service: 08/23/2019  Patient Care Team: [name], MD as PCP - General (Family Medicine)  CHIEF COMPLAINTS/PURPOSE OF CONSULTATION:  F/u for CLL  HISTORY OF PRESENTING ILLNESS:   Benjamin Watkins is a wonderful 86 y.o. male who has been referred to [place] by his PCP, Dr. Brigitte Pulse from [place] for evaluation and management of elevated WBC.  Of note, Urine test on 11/20/16 show 2+ WBC in urine. Labs on 05/22/17 showed WBC at 18.7. PCP referred him with concern for a smoldering blood cancer. 06/26/17 Labs shows increase of WBC to 21.3.   He presents to the clinic today noting he was first told about elevated WBC in 03/2017. He notes he has felt normal overall, asymptomatic. He has had a few UTIs when he was told about his elevated in WBC. He notes to having a recent eye infection. He is still has residual infection and is continuing treatment.  He notes he has been slowly losing weight purposefully over the last 3 years as he has been watching his weight. He last about 30 pounds. In the past 6 months he lost 2-4 pounds. He lives with his wife and is able to get around well. He notes his age is starting to effect him but he is independent still. He is a currently retired Glass blower/designer.   In the past he was diagnosed with diabetes. He has never been a smoker and does not drink alcohol. He has not been on steroids. He has not received blood transfusion in the past nor has he had tattoos.   On review of symptoms, pt notes to eye infection symptoms. He denies change in breathing, SOB.  He denies abdominal pain, chills, night sweats, no new skin rashes and bums. He has swelling in his left ankle from an ongoing rash which is monitored by Dermatologist. He has occasional arthritis in his arms and knees with back problems at times. He reports to purposeful slow weight loss.    INTERVAL HISTORY:   Benjamin Watkins is here for management and  evaluation of his CLL. He is here prior to beginning Venetoclax. The patient's last visit with [place] was on 05/06/2019. The pt reports that he is doing well overall.  The pt reports that he is not eating as much as he used to because he is currently unable to get exercise as normal. He used to walk daily before the pandemic. He continues to eat until he is full and satisfied. He was having some increased swelling in his left leg that has gone down since the holidays.   Pt has received the first dose of COVID19 vaccine and has the other scheduled for the beginning of February. He denies any issues with the first dose.   Lab results today (08/23/19) of CBC w/diff and CMP is as follows: all values are WNL except for WBC at 184.3K, RBC at 3.05, Hgb at 9.7, HCT at 31.5, MCV at 103.3, RDW at 16.0, PLT at 123K, Lymphs Abs at 154.8, Mono Abs at 22.2, Abs Immature Granulocytes at 0.51K, Smudge Cells are "Present", Glucose at 109, BUN at 33, Creatinine at 2.00, Calcium at 8.7, Total Protein at 6.1, GFR Est Non Af Am at 30. 08/23/2019 LDH at 256 08/23/2019 Uric Acid at 8.2  On review of systems, pt reports leg swelling, low appetite and denies fevers, chills, night sweats, new lumps/bumps, abdominal pain and any other symptoms.    MEDICAL HISTORY:  Past Medical History:  Diagnosis Date  . Diabetes mellitus without complication ([place])   . Diverticulosis   . History of colon polyps   . Hypertension   . Low back pain   . Right foot drop     SURGICAL HISTORY: Past Surgical History:  Procedure Laterality Date  . CHOLECYSTECTOMY     laparoscopic  . COLONOSCOPY WITH PROPOFOL N/A 12/10/2015   Procedure: COLONOSCOPY WITH PROPOFOL;  Surgeon: Garlan Fair, MD;  Location: [facility];  Service: Endoscopy;  Laterality: N/A;  . EYE SURGERY Bilateral    bleeding retina  . TONSILLECTOMY      SOCIAL HISTORY: Social History   Socioeconomic History  . Marital status: Married    Spouse name: Not on file    . Number of children: 2  . Years of education: some college  . Highest education level: Not on file  Occupational History  . Occupation: Retired  Tobacco Use  . Smoking status: Never Smoker  . Smokeless tobacco: Never Used  Substance and Sexual Activity  . Alcohol use: No  . Drug use: No  . Sexual activity: Not on file  Other Topics Concern  . Not on file  Social History Narrative   Lives at home with his wife.   Right-handed.   0.5 cup of coffee each morning, some tea.   Social Determinants of Health   Financial Resource Strain:   . Difficulty of Paying Living Expenses: Not on file  Food Insecurity:   . Worried About Charity fundraiser in the Last Year: Not on file  . Ran Out of Food in the Last Year: Not on file  Transportation Needs:   . Lack of Transportation (Medical): Not on file  . Lack of Transportation (Non-Medical): Not on file  Physical Activity:   . Days of Exercise per Week: Not on file  . Minutes of Exercise per Session: Not on file  Stress:   . Feeling of Stress : Not on file  Social Connections:   . Frequency of Communication with Friends and Family: Not on file  . Frequency of Social Gatherings with Friends and Family: Not on file  . Attends Religious Services: Not on file  . Active Member of Clubs or Organizations: Not on file  . Attends Archivist Meetings: Not on file  . Marital Status: Not on file  Intimate Partner Violence:   . Fear of Current or Ex-Partner: Not on file  . Emotionally Abused: Not on file  . Physically Abused: Not on file  . Sexually Abused: Not on file    FAMILY HISTORY: Family History  Problem Relation Age of Onset  . Stroke Mother   . Diabetes Mother   . Other Father        "old age"    ALLERGIES:  is allergic to hydrochlorothiazide and lisinopril.  MEDICATIONS:  Current Outpatient Medications  Medication Sig Dispense Refill  . acetaminophen (TYLENOL) 325 MG tablet Take 650 mg by mouth 2 (two) times  daily.    Marland Kitchen aspirin EC 81 MG tablet Take 81 mg by mouth daily.    . carvedilol (COREG) 25 MG tablet Take 25 mg by mouth 2 (two) times daily with a meal.    . chlorthalidone (HYGROTON) 25 MG tablet Take 25 mg by mouth daily.    . Cyanocobalamin (VITAMIN B-12) 1000 MCG SUBL PLACE 1  UNDER THE TONGUE ONCE DAILY 30 each 0  . glimepiride (AMARYL) 2 MG tablet Take 2 mg  by mouth daily with breakfast.    . hydrALAZINE (APRESOLINE) 25 MG tablet Take 25 mg by mouth 2 (two) times daily.     . niacin 500 MG tablet Take 500 mg by mouth at bedtime.    Marland Kitchen NOVOLIN 70/30 RELION (70-30) 100 UNIT/ML injection Inject 70-74 Units into the skin 2 (two) times daily. Takes 74 units in the morning and 70 units at night.    Marland Kitchen omeprazole (PRILOSEC) 20 MG capsule Take 20 mg by mouth daily.    Vladimir Faster Glycol-Propyl Glycol (SYSTANE ULTRA OP) Place 2 drops into both eyes daily as needed (For dry eyes.).    Marland Kitchen venetoclax 10 & 50 & 100 MG TBPK Take 20mg  daily with food for 7days, then 50mg  daily for 7days, then 100mg  daily for 7days, then 200mg  daily for 7days. (Patient not taking: Reported on 08/23/2019) 1 each 0  . venetoclax 10 MG TABS Take 20 mg by mouth daily. (Patient not taking: Reported on 08/23/2019) 14 tablet 0   No current facility-administered medications for this visit.    REVIEW OF SYSTEMS:  A 10+ POINT REVIEW OF SYSTEMS WAS OBTAINED including neurology, dermatology, psychiatry, cardiac, respiratory, lymph, extremities, GI, GU, Musculoskeletal, constitutional, breasts, reproductive, HEENT.  All pertinent positives are noted in the HPI.  All others are negative.   PHYSICAL EXAMINATION:  ECOG FS:2 - Symptomatic, <50% confined to bed  .BP (!) 108/46 (BP Location: Left Arm, Patient Position: Sitting) Comment: Notified Nurse of BP  Pulse 73   Temp 98.2 F (36.8 C) (Temporal)   Resp 18   Ht 6' 2.5" (1.892 m)   Wt 233 lb 14.4 oz (106.1 kg)   SpO2 98%   BMI 29.63 kg/m   Vitals:   08/23/19 1435  BP: (!)  108/46  Pulse: 73  Resp: 18  Temp: 98.2 F (36.8 C)  SpO2: 98%   Wt Readings from Last 3 Encounters:  08/23/19 233 lb 14.4 oz (106.1 kg)  05/06/19 257 lb 11.2 oz (116.9 kg)  02/04/19 248 lb 12.8 oz (112.9 kg)   Body mass index is 29.63 kg/m.    Exam was given in a chair   GENERAL:alert, in no acute distress and comfortable SKIN: no acute rashes, no significant lesions EYES: conjunctiva are pink and non-injected, sclera anicteric OROPHARYNX: MMM, no exudates, no oropharyngeal erythema or ulceration NECK: supple, no JVD LYMPH:  no palpable lymphadenopathy in the cervical, axillary or inguinal regions LUNGS: clear to auscultation b/l with normal respiratory effort HEART: regular rate & rhythm ABDOMEN:  normoactive bowel sounds , non tender, not distended. No palpable hepatomegaly. Spleen 3 finger breadths below the left costal margin Extremity: 1+ pedal edema b/l PSYCH: alert & oriented x 3 with fluent speech NEURO: no focal motor/sensory deficits  LABORATORY DATA:  I have reviewed the data as listed  . CBC Latest Ref Rng & Units 08/23/2019 05/06/2019 02/04/2019  WBC 4.0 - 10.5 K/uL 184.3(HH) 142.5(HH) 93.6(HH)  Hemoglobin 13.0 - 17.0 g/dL 9.7(L) 10.8(L) 12.1(L)  Hematocrit 39.0 - 52.0 % 31.5(L) 34.3(L) 38.8(L)  Platelets 150 - 400 K/uL 123(L) 128(L) 137(L)   . CBC    Component Value Date/Time   WBC 184.3 (HH) 08/23/2019 1310   WBC 142.5 (HH) 05/06/2019 1024   RBC 3.05 (L) 08/23/2019 1310   HGB 9.7 (L) 08/23/2019 1310   HCT 31.5 (L) 08/23/2019 1310   PLT 123 (L) 08/23/2019 1310   MCV 103.3 (H) 08/23/2019 1310   MCH 31.8 08/23/2019 1310   MCHC  30.8 08/23/2019 1310   RDW 16.0 (H) 08/23/2019 1310   LYMPHSABS 154.8 (H) 08/23/2019 1310   MONOABS 22.2 (H) 08/23/2019 1310   EOSABS 0.3 08/23/2019 1310   BASOSABS 0.1 08/23/2019 1310    . CMP Latest Ref Rng & Units 08/23/2019 05/06/2019 02/04/2019  Glucose 70 - 99 mg/dL 109(H) 69(L) 110(H)  BUN 8 - 23 mg/dL 33(H) 30(H)  26(H)  Creatinine 0.61 - 1.24 mg/dL 2.00(H) 1.92(H) 1.97(H)  Sodium 135 - 145 mmol/L 142 143 146(H)  Potassium 3.5 - 5.1 mmol/L 4.7 4.5 4.1  Chloride 98 - 111 mmol/L 108 106 111  CO2 22 - 32 mmol/L 25 29 27   Calcium 8.9 - 10.3 mg/dL 8.7(L) 8.6(L) 8.3(L)  Total Protein 6.5 - 8.1 g/dL 6.1(L) 6.2(L) 6.5  Total Bilirubin 0.3 - 1.2 mg/dL 0.6 0.5 0.6  Alkaline Phos 38 - 126 U/L 110 122 110  AST 15 - 41 U/L 15 15 16   ALT 0 - 44 U/L 12 11 9     . Lab Results  Component Value Date   LDH 256 (H) 08/23/2019   11/24/17 FISH CLL Prognostic:     RADIOGRAPHIC STUDIES: I have personally reviewed the radiological images as listed and agreed with the findings in the report. No results found.   ASSESSMENT & PLAN:   Benjamin Watkins is a 86 y.o. caucasian male with    1. Chronic Lymphocytic Leukemia The above w/u was reviewed and diagnosis of CLL was made based on PBS and flow cytometry results Likely Rai 0 Patient has thrombocytopenia PLT 117k (not <100k to count as staging criteria) No anemia or clinical splenomegaly 11/24/17 FISH CLL Prognostic panel did not reveal a standard mutation.  Lab Results  Component Value Date   LDH 256 (H) 08/23/2019   PLAN:  -Discussed pt labwork today, 08/23/19; all values are WNL except for WBC at 184.3K, RBC at 3.05, Hgb at 9.7, HCT at 31.5, MCV at 103.3, RDW at 16.0, PLT at 123K, Lymphs Abs at 154.8, Mono Abs at 22.2, Abs Immature Granulocytes at 0.51K, Smudge Cells are "Present", Glucose at 109, BUN at 33, Creatinine at 2.00, Calcium at 8.7, Total Protein at 6.1, GFR Est Non Af Am at 30. -Discussed 08/23/2019 LDH is stable at 256 -Discussed 08/23/2019 Uric Acid is WNL at 8.2 -WBC continues to elevate, worsening anemia, stable thrombocytopenia -Will continue to watch Uric Acid -Recommend pt drink 48-64 oz of water daily  -Recommend pt wear compression socks, elevate legs  -Recommend pt moisturize his lower legs - skin is cracking due venous  insufficiency -Recommend pt f/u with second dose of COVID19 vaccine as scheduled -The pt has no prohibitive toxicities from beginning Venetoclax daily at this time -Advised pt to take Venetoclax 20 mg for one week, 50 mg for one week, 100 mg for one week, and then 200 mg for one week -Advised pt we will get labs twice per week (for the first month) and fluids as needed -Will see back in 2 weeks with labs   The total time spent in the appt was 30 minutes and more than 50% was on counseling and direct patient cares.  All of the patient's questions were answered with apparent satisfaction. The patient knows to call the clinic with any problems, questions or concerns.    Sullivan Lone MD [place] AAHIVMS [facility] Hematology/Oncology Physician [facility]  (Office):       [phone] (Work cell):  [phone] (Fax):  [phone]  08/23/2019 5:06 PM  I, Yevette Edwards, am acting as a scribe for Dr. Sullivan Lone.   Brunetta Genera MD

## 2019-08-23 NOTE — Telephone Encounter (Signed)
Oral Chemotherapy Pharmacist Encounter   Reviewed the note by pharmacy2 student Greg. Additional comments, patient is coming in today for new labs prior to the start of venetoclax. Labs will be reviewed prior to the start of venetoclax.   Drug-drug interaction with carvedilol:  Carvedilol may increase the concentration of venetoclax. The general recommendation is to reduce the venetoclax dose by 50%. The plan is already to start Benjamin Watkins on a lower starting dose (10mg instead of 20mg) and increase weekly. The started kit will take him to 200mg daily. He will have lab monitoring during the escalation. Dose escalation will be stopped if not tolerated.     N. , PharmD, BCPS, BCOP Hematology/Oncology Clinical Pharmacist [facility] [phone]  08/23/2019 9:37 AM  

## 2019-08-23 NOTE — Telephone Encounter (Signed)
Oral Chemotherapy Pharmacist Encounter  Venetoclax starter kit hand delivered to patient in clinic follow his appt with Dr. [name].   Patient Education I spoke with patient and his wife in clinic following their appt with Dr. [name] for overview of new oral chemotherapy medication: Venclexta (Venetoclax) for the treatment of CLL.  Planned duration until disease progression or unacceptable toxicity.   Counseled patient on administration, dosing, side effects, monitoring, drug-food interactions, safe handling, storage, and disposal. Patient will take 26m daily with food for 7 days, then 265mdaily for 7days, then 509maily for 7days, then 100m94mily for 7days, then 200mg38mly for 7days.  Side effects include but not limited to: TLS syndrome, increased risk of infection, N/V, fatigue.    Spoke with Dr. Kale Irene Limbohe sent in a prescription for allopurinol for TLS prophylaxis. I spoke with Mr. WilliCervenkalet him know about allopurinol and instructed him to start the allopurinol as soon as he received it ahead of his starting the venetoclax.  Also spoke to the patient about the importance to hydration during the initiation of venetoclax to rpevent TLS.   Reviewed with patient importance of keeping a medication schedule and plan for any missed doses.  Mr. and Mrs. Tunnell voiced understanding and appreciation. All questions answered. Medication handout provided.  [place] [place] [facility]. Patient knows to call the office with questions or concerns. Oral Chemotherapy Navigation Clinic will continue to follow.  AlysoDarl PikesrmD, BCPS, BCOP [facility] Clinical Pharmacist [facility]5418 [phone] 4:26 PM

## 2019-08-24 ENCOUNTER — Other Ambulatory Visit: Payer: Self-pay | Admitting: Hematology

## 2019-08-24 ENCOUNTER — Telehealth: Payer: Self-pay | Admitting: Hematology

## 2019-08-24 MED ORDER — ALLOPURINOL 100 MG PO TABS: 100.0000 mg | ORAL_TABLET | Freq: Two times a day (BID) | ORAL | 1 refills | Status: DC

## 2019-08-24 MED FILL — ALLOPURINOL 100 MG TABS: 100 | 30 days supply | Qty: 60 | Fill #0

## 2019-08-24 NOTE — Telephone Encounter (Signed)
No new los during check out.

## 2019-08-27 DIAGNOSIS — R404 Transient alteration of awareness: Secondary | ICD-10-CM | POA: Diagnosis not present

## 2019-08-27 DIAGNOSIS — E162 Hypoglycemia, unspecified: Secondary | ICD-10-CM | POA: Diagnosis not present

## 2019-08-27 DIAGNOSIS — E161 Other hypoglycemia: Secondary | ICD-10-CM | POA: Diagnosis not present

## 2019-08-29 ENCOUNTER — Other Ambulatory Visit: Payer: Self-pay | Admitting: *Deleted

## 2019-08-29 DIAGNOSIS — C9110 Chronic lymphocytic leukemia of B-cell type not having achieved remission: Secondary | ICD-10-CM

## 2019-08-30 ENCOUNTER — Other Ambulatory Visit: Payer: Medicare Other

## 2019-08-30 ENCOUNTER — Telehealth: Payer: Self-pay | Admitting: Hematology

## 2019-08-30 ENCOUNTER — Telehealth: Payer: Self-pay | Admitting: *Deleted

## 2019-08-30 ENCOUNTER — Ambulatory Visit: Payer: Medicare Other

## 2019-08-30 DIAGNOSIS — N184 Chronic kidney disease, stage 4 (severe): Secondary | ICD-10-CM | POA: Diagnosis not present

## 2019-08-30 NOTE — Telephone Encounter (Signed)
Scheduled appt per 2/1 sch message - unable to reach pt . Left message with appt date and time

## 2019-08-30 NOTE — Telephone Encounter (Signed)
Patient missed call about appts this morning for lab/fluids, so did not come to appts. Rescheduled appts for tomorrow 2/3. Patient verbalized understanding- lab at 8am and infusion appt at 9am

## 2019-08-31 ENCOUNTER — Telehealth: Payer: Self-pay | Admitting: *Deleted

## 2019-08-31 ENCOUNTER — Other Ambulatory Visit: Payer: Self-pay

## 2019-08-31 ENCOUNTER — Inpatient Hospital Stay: Payer: Medicare Other | Attending: Hematology

## 2019-08-31 ENCOUNTER — Inpatient Hospital Stay: Payer: Medicare Other

## 2019-08-31 DIAGNOSIS — Z79899 Other long term (current) drug therapy: Secondary | ICD-10-CM | POA: Diagnosis not present

## 2019-08-31 DIAGNOSIS — Z833 Family history of diabetes mellitus: Secondary | ICD-10-CM | POA: Insufficient documentation

## 2019-08-31 DIAGNOSIS — D696 Thrombocytopenia, unspecified: Secondary | ICD-10-CM | POA: Insufficient documentation

## 2019-08-31 DIAGNOSIS — Z888 Allergy status to other drugs, medicaments and biological substances status: Secondary | ICD-10-CM | POA: Diagnosis not present

## 2019-08-31 DIAGNOSIS — C9110 Chronic lymphocytic leukemia of B-cell type not having achieved remission: Secondary | ICD-10-CM | POA: Insufficient documentation

## 2019-08-31 DIAGNOSIS — Z794 Long term (current) use of insulin: Secondary | ICD-10-CM | POA: Diagnosis not present

## 2019-08-31 DIAGNOSIS — M7989 Other specified soft tissue disorders: Secondary | ICD-10-CM | POA: Diagnosis not present

## 2019-08-31 DIAGNOSIS — Z8719 Personal history of other diseases of the digestive system: Secondary | ICD-10-CM | POA: Insufficient documentation

## 2019-08-31 DIAGNOSIS — M25472 Effusion, left ankle: Secondary | ICD-10-CM | POA: Diagnosis not present

## 2019-08-31 DIAGNOSIS — R21 Rash and other nonspecific skin eruption: Secondary | ICD-10-CM | POA: Diagnosis not present

## 2019-08-31 DIAGNOSIS — Z823 Family history of stroke: Secondary | ICD-10-CM | POA: Insufficient documentation

## 2019-08-31 DIAGNOSIS — E119 Type 2 diabetes mellitus without complications: Secondary | ICD-10-CM | POA: Diagnosis not present

## 2019-08-31 DIAGNOSIS — R634 Abnormal weight loss: Secondary | ICD-10-CM | POA: Diagnosis not present

## 2019-08-31 DIAGNOSIS — R55 Syncope and collapse: Secondary | ICD-10-CM | POA: Insufficient documentation

## 2019-08-31 LAB — CBC WITH DIFFERENTIAL (CANCER CENTER ONLY)
Abs Immature Granulocytes: 0.7 10*3/uL — ABNORMAL HIGH (ref 0.00–0.07)
Basophils Absolute: 0.3 10*3/uL — ABNORMAL HIGH (ref 0.0–0.1)
Basophils Relative: 0 %
Eosinophils Absolute: 0.2 10*3/uL (ref 0.0–0.5)
Eosinophils Relative: 0 %
HCT: 30.2 % — ABNORMAL LOW (ref 39.0–52.0)
Hemoglobin: 9.2 g/dL — ABNORMAL LOW (ref 13.0–17.0)
Immature Granulocytes: 0 %
Lymphocytes Relative: 81 %
Lymphs Abs: 183.4 10*3/uL — ABNORMAL HIGH (ref 0.7–4.0)
MCH: 32.1 pg (ref 26.0–34.0)
MCHC: 30.5 g/dL (ref 30.0–36.0)
MCV: 105.2 fL — ABNORMAL HIGH (ref 80.0–100.0)
Monocytes Absolute: 35.1 10*3/uL — ABNORMAL HIGH (ref 0.1–1.0)
Monocytes Relative: 16 %
Neutro Abs: 6.7 10*3/uL (ref 1.7–7.7)
Neutrophils Relative %: 3 %
Platelet Count: 137 10*3/uL — ABNORMAL LOW (ref 150–400)
RBC: 2.87 MIL/uL — ABNORMAL LOW (ref 4.22–5.81)
RDW: 16 % — ABNORMAL HIGH (ref 11.5–15.5)
WBC Count: 226.4 10*3/uL (ref 4.0–10.5)
nRBC: 0 % (ref 0.0–0.2)

## 2019-08-31 LAB — CMP (CANCER CENTER ONLY)
ALT: 10 U/L (ref 0–44)
AST: 15 U/L (ref 15–41)
Albumin: 3.9 g/dL (ref 3.5–5.0)
Alkaline Phosphatase: 120 U/L (ref 38–126)
Anion gap: 11 (ref 5–15)
BUN: 45 mg/dL — ABNORMAL HIGH (ref 8–23)
CO2: 25 mmol/L (ref 22–32)
Calcium: 8.7 mg/dL — ABNORMAL LOW (ref 8.9–10.3)
Chloride: 107 mmol/L (ref 98–111)
Creatinine: 2.15 mg/dL — ABNORMAL HIGH (ref 0.61–1.24)
GFR, Est AFR Am: 32 mL/min — ABNORMAL LOW (ref >60)
GFR, Estimated: 27 mL/min — ABNORMAL LOW (ref >60)
Glucose, Bld: 79 mg/dL (ref 70–99)
Potassium: 3.7 mmol/L (ref 3.5–5.1)
Sodium: 143 mmol/L (ref 135–145)
Total Bilirubin: 0.5 mg/dL (ref 0.3–1.2)
Total Protein: 6.1 g/dL — ABNORMAL LOW (ref 6.5–8.1)

## 2019-08-31 LAB — URIC ACID: Uric Acid, Serum: 7 mg/dL (ref 3.7–8.6)

## 2019-08-31 NOTE — Progress Notes (Signed)
Per Dr. [name] patient will not receive fluids today and he is to drink 64 oz of fluids per day and come to his next appt as scheduled. Patient made aware of above information and verbalized understanding.

## 2019-08-31 NOTE — Telephone Encounter (Signed)
Per Dr.Kale - contacted patient - no answer - LVM to inform appt for lab and IV fluids on Friday not needed. Appts on Friday 2/5 cancelled. Schedule message sent to schedule appt for him on Monday 2/8 for labs/ MD appt/IV Fluids if needed. Encouraged to call office for questions.

## 2019-09-02 ENCOUNTER — Ambulatory Visit: Payer: Medicare Other

## 2019-09-02 ENCOUNTER — Other Ambulatory Visit: Payer: Medicare Other

## 2019-09-02 ENCOUNTER — Telehealth: Payer: Self-pay | Admitting: Hematology

## 2019-09-02 ENCOUNTER — Telehealth: Payer: Self-pay | Admitting: *Deleted

## 2019-09-02 NOTE — Telephone Encounter (Signed)
Contacted patient with appt times for Monday 2/8 and Thursday 2/11. Patient verbalized understanding.

## 2019-09-02 NOTE — Telephone Encounter (Signed)
Called patient regarding 02/03 scheduled messages, scheduled future appointments per provider. Left a voicemail.

## 2019-09-05 ENCOUNTER — Other Ambulatory Visit: Payer: Self-pay

## 2019-09-05 ENCOUNTER — Other Ambulatory Visit: Payer: Medicare Other

## 2019-09-05 ENCOUNTER — Inpatient Hospital Stay: Payer: Medicare Other

## 2019-09-05 ENCOUNTER — Inpatient Hospital Stay (HOSPITAL_BASED_OUTPATIENT_CLINIC_OR_DEPARTMENT_OTHER): Payer: Medicare Other | Admitting: Hematology

## 2019-09-05 ENCOUNTER — Ambulatory Visit: Payer: Medicare Other | Admitting: Hematology

## 2019-09-05 ENCOUNTER — Ambulatory Visit: Payer: Medicare Other

## 2019-09-05 VITALS — BP 133/43 | HR 60 | Temp 98.3°F | Resp 18 | Ht 74.0 in | Wt 238.7 lb

## 2019-09-05 DIAGNOSIS — C9110 Chronic lymphocytic leukemia of B-cell type not having achieved remission: Secondary | ICD-10-CM

## 2019-09-05 DIAGNOSIS — D649 Anemia, unspecified: Secondary | ICD-10-CM

## 2019-09-05 DIAGNOSIS — M25472 Effusion, left ankle: Secondary | ICD-10-CM | POA: Diagnosis not present

## 2019-09-05 DIAGNOSIS — D696 Thrombocytopenia, unspecified: Secondary | ICD-10-CM | POA: Diagnosis not present

## 2019-09-05 DIAGNOSIS — E119 Type 2 diabetes mellitus without complications: Secondary | ICD-10-CM | POA: Diagnosis not present

## 2019-09-05 DIAGNOSIS — R634 Abnormal weight loss: Secondary | ICD-10-CM | POA: Diagnosis not present

## 2019-09-05 DIAGNOSIS — R21 Rash and other nonspecific skin eruption: Secondary | ICD-10-CM | POA: Diagnosis not present

## 2019-09-05 LAB — CMP (CANCER CENTER ONLY)
ALT: 15 U/L (ref 0–44)
AST: 19 U/L (ref 15–41)
Albumin: 3.9 g/dL (ref 3.5–5.0)
Alkaline Phosphatase: 116 U/L (ref 38–126)
Anion gap: 8 (ref 5–15)
BUN: 31 mg/dL — ABNORMAL HIGH (ref 8–23)
CO2: 25 mmol/L (ref 22–32)
Calcium: 8.6 mg/dL — ABNORMAL LOW (ref 8.9–10.3)
Chloride: 110 mmol/L (ref 98–111)
Creatinine: 1.92 mg/dL — ABNORMAL HIGH (ref 0.61–1.24)
GFR, Est AFR Am: 36 mL/min — ABNORMAL LOW (ref >60)
GFR, Estimated: 31 mL/min — ABNORMAL LOW (ref >60)
Glucose, Bld: 89 mg/dL (ref 70–99)
Potassium: 3.9 mmol/L (ref 3.5–5.1)
Sodium: 143 mmol/L (ref 135–145)
Total Bilirubin: 0.6 mg/dL (ref 0.3–1.2)
Total Protein: 6.1 g/dL — ABNORMAL LOW (ref 6.5–8.1)

## 2019-09-05 LAB — CBC WITH DIFFERENTIAL (CANCER CENTER ONLY)
Abs Immature Granulocytes: 0 10*3/uL (ref 0.00–0.07)
Basophils Absolute: 0 10*3/uL (ref 0.0–0.1)
Basophils Relative: 0 %
Eosinophils Absolute: 0 10*3/uL (ref 0.0–0.5)
Eosinophils Relative: 0 %
HCT: 30.9 % — ABNORMAL LOW (ref 39.0–52.0)
Hemoglobin: 9.7 g/dL — ABNORMAL LOW (ref 13.0–17.0)
Lymphocytes Relative: 93 %
Lymphs Abs: 191.2 10*3/uL — ABNORMAL HIGH (ref 0.7–4.0)
MCH: 32.8 pg (ref 26.0–34.0)
MCHC: 31.4 g/dL (ref 30.0–36.0)
MCV: 104.4 fL — ABNORMAL HIGH (ref 80.0–100.0)
Monocytes Absolute: 2.1 10*3/uL — ABNORMAL HIGH (ref 0.1–1.0)
Monocytes Relative: 1 %
Neutro Abs: 12.3 10*3/uL — ABNORMAL HIGH (ref 1.7–7.7)
Neutrophils Relative %: 6 %
Platelet Count: 129 10*3/uL — ABNORMAL LOW (ref 150–400)
RBC: 2.96 MIL/uL — ABNORMAL LOW (ref 4.22–5.81)
RDW: 16.2 % — ABNORMAL HIGH (ref 11.5–15.5)
WBC Count: 205.6 10*3/uL (ref 4.0–10.5)
nRBC: 0 % (ref 0.0–0.2)

## 2019-09-05 LAB — URIC ACID: Uric Acid, Serum: 6.4 mg/dL (ref 3.7–8.6)

## 2019-09-05 NOTE — Progress Notes (Signed)
HEMATOLOGY/ONCOLOGY CLINIC NOTE  Date of Service: 09/05/2019  Patient Care Team: [name], MD as PCP - General (Family Medicine)  CHIEF COMPLAINTS/PURPOSE OF CONSULTATION:  F/u for CLL  HISTORY OF PRESENTING ILLNESS:   Benjamin Watkins is a wonderful 86 y.o. male who has been referred to [place] by his PCP, Dr. Brigitte Pulse from [place] for evaluation and management of elevated WBC.  Of note, Urine test on 11/20/16 show 2+ WBC in urine. Labs on 05/22/17 showed WBC at 18.7. PCP referred him with concern for a smoldering blood cancer. 06/26/17 Labs shows increase of WBC to 21.3.   He presents to the clinic today noting he was first told about elevated WBC in 03/2017. He notes he has felt normal overall, asymptomatic. He has had a few UTIs when he was told about his elevated in WBC. He notes to having a recent eye infection. He is still has residual infection and is continuing treatment.  He notes he has been slowly losing weight purposefully over the last 3 years as he has been watching his weight. He last about 30 pounds. In the past 6 months he lost 2-4 pounds. He lives with his wife and is able to get around well. He notes his age is starting to effect him but he is independent still. He is a currently retired Glass blower/designer.   In the past he was diagnosed with diabetes. He has never been a smoker and does not drink alcohol. He has not been on steroids. He has not received blood transfusion in the past nor has he had tattoos.   On review of symptoms, pt notes to eye infection symptoms. He denies change in breathing, SOB.  He denies abdominal pain, chills, night sweats, no new skin rashes and bums. He has swelling in his left ankle from an ongoing rash which is monitored by Dermatologist. He has occasional arthritis in his arms and knees with back problems at times. He reports to purposeful slow weight loss.    INTERVAL HISTORY:   Benjamin Watkins is here for management and  evaluation of his CLL. The patient is here for a toxicity check after beginning Venetoclax. The patient's last visit with [place] was on 08/23/2019. The pt reports that he is doing well overall.  The pt reports that he has had no new issues since beginning Venetoclax. Pt has completed a full week of 10 mg of Venetoclax and is on his third day of 20 mg. Pt has been drinking 50-60 oz of water per day. His leg swelling has been steady in the interim. Pt has been on 70/30 insulin and had a fainting episode due to low blood sugars last week. His blood sugars were found to be around 50 during this incident. Pt does typically feel when his blood sugars are low and tries to eat something when this happens.   Lab results today (09/05/19) of CBC w/diff and CMP is as follows: all values are WNL except for WBC at 205.6K, RBC at 2.96, Hgb at 9.7, HCT at 30.9, MCV at 104.4, RDW at 16.2, PLT at 129K, BUN at 31, Creatinine at 1.92, Calcium at 8.6, Total Protein at 6.1, GFR Est Non Af Am at 31. 09/05/2019 Uric acid at 6.4  On review of systems, pt denies worsening leg swelling, mouth sores, abdominal pain, constipation, diarrhea and any other symptoms.   MEDICAL HISTORY:  Past Medical History:  Diagnosis Date  . Diabetes mellitus without complication ([place])   .  Diverticulosis   . History of colon polyps   . Hypertension   . Low back pain   . Right foot drop     SURGICAL HISTORY: Past Surgical History:  Procedure Laterality Date  . CHOLECYSTECTOMY     laparoscopic  . COLONOSCOPY WITH PROPOFOL N/A 12/10/2015   Procedure: COLONOSCOPY WITH PROPOFOL;  Surgeon: Garlan Fair, MD;  Location: [facility];  Service: Endoscopy;  Laterality: N/A;  . EYE SURGERY Bilateral    bleeding retina  . TONSILLECTOMY      SOCIAL HISTORY: Social History   Socioeconomic History  . Marital status: Married    Spouse name: Not on file  . Number of children: 2  . Years of education: some college  . Highest education level:  Not on file  Occupational History  . Occupation: Retired  Tobacco Use  . Smoking status: Never Smoker  . Smokeless tobacco: Never Used  Substance and Sexual Activity  . Alcohol use: No  . Drug use: No  . Sexual activity: Not on file  Other Topics Concern  . Not on file  Social History Narrative   Lives at home with his wife.   Right-handed.   0.5 cup of coffee each morning, some tea.   Social Determinants of Health   Financial Resource Strain:   . Difficulty of Paying Living Expenses: Not on file  Food Insecurity:   . Worried About Charity fundraiser in the Last Year: Not on file  . Ran Out of Food in the Last Year: Not on file  Transportation Needs:   . Lack of Transportation (Medical): Not on file  . Lack of Transportation (Non-Medical): Not on file  Physical Activity:   . Days of Exercise per Week: Not on file  . Minutes of Exercise per Session: Not on file  Stress:   . Feeling of Stress : Not on file  Social Connections:   . Frequency of Communication with Friends and Family: Not on file  . Frequency of Social Gatherings with Friends and Family: Not on file  . Attends Religious Services: Not on file  . Active Member of Clubs or Organizations: Not on file  . Attends Archivist Meetings: Not on file  . Marital Status: Not on file  Intimate Partner Violence:   . Fear of Current or Ex-Partner: Not on file  . Emotionally Abused: Not on file  . Physically Abused: Not on file  . Sexually Abused: Not on file    FAMILY HISTORY: Family History  Problem Relation Age of Onset  . Stroke Mother   . Diabetes Mother   . Other Father        "old age"    ALLERGIES:  is allergic to hydrochlorothiazide and lisinopril.  MEDICATIONS:  Current Outpatient Medications  Medication Sig Dispense Refill  . acetaminophen (TYLENOL) 325 MG tablet Take 650 mg by mouth 2 (two) times daily.    Marland Kitchen allopurinol (ZYLOPRIM) 100 MG tablet Take 1 tablet (100 mg total) by mouth 2  (two) times daily. 60 tablet 1  . aspirin EC 81 MG tablet Take 81 mg by mouth daily.    . carvedilol (COREG) 25 MG tablet Take 25 mg by mouth 2 (two) times daily with a meal.    . chlorthalidone (HYGROTON) 25 MG tablet Take 25 mg by mouth daily.    . Cyanocobalamin (VITAMIN B-12) 1000 MCG SUBL PLACE 1  UNDER THE TONGUE ONCE DAILY 30 each 0  . glimepiride (AMARYL) 2  MG tablet Take 2 mg by mouth daily with breakfast.    . hydrALAZINE (APRESOLINE) 25 MG tablet Take 25 mg by mouth 2 (two) times daily.     . niacin 500 MG tablet Take 500 mg by mouth at bedtime.    Marland Kitchen NOVOLIN 70/30 RELION (70-30) 100 UNIT/ML injection Inject 70-74 Units into the skin 2 (two) times daily. Takes 74 units in the morning and 70 units at night.    Marland Kitchen omeprazole (PRILOSEC) 20 MG capsule Take 20 mg by mouth daily.    Vladimir Faster Glycol-Propyl Glycol (SYSTANE ULTRA OP) Place 2 drops into both eyes daily as needed (For dry eyes.).    Marland Kitchen venetoclax 10 MG TABS Take 20 mg by mouth daily. 14 tablet 0  . venetoclax 10 & 50 & 100 MG TBPK Take 20mg  daily with food for 7days, then 50mg  daily for 7days, then 100mg  daily for 7days, then 200mg  daily for 7days. (Patient not taking: Reported on 09/05/2019) 1 each 0   No current facility-administered medications for this visit.    REVIEW OF SYSTEMS:  A 10+ POINT REVIEW OF SYSTEMS WAS OBTAINED including neurology, dermatology, psychiatry, cardiac, respiratory, lymph, extremities, GI, GU, Musculoskeletal, constitutional, breasts, reproductive, HEENT.  All pertinent positives are noted in the HPI.  All others are negative.   PHYSICAL EXAMINATION:  ECOG FS:2 - Symptomatic, <50% confined to bed  .BP (!) 133/43 Comment: Engineer, manufacturing notified  Pulse 60   Temp 98.3 F (36.8 C) (Temporal)   Resp 18   Ht 6\' 2"  (1.88 m)   Wt 238 lb 11.2 oz (108.3 kg)   SpO2 100%   BMI 30.65 kg/m   Vitals:   09/05/19 0942  BP: (!) 133/43  Pulse: 60  Resp: 18  Temp: 98.3 F (36.8 C)  SpO2: 100%   Wt  Readings from Last 3 Encounters:  09/05/19 238 lb 11.2 oz (108.3 kg)  08/23/19 233 lb 14.4 oz (106.1 kg)  05/06/19 257 lb 11.2 oz (116.9 kg)   Body mass index is 30.65 kg/m.    Exam was given in a chair   GENERAL:alert, in no acute distress and comfortable SKIN: no acute rashes, no significant lesions EYES: conjunctiva are pink and non-injected, sclera anicteric OROPHARYNX: MMM, no exudates, no oropharyngeal erythema or ulceration NECK: supple, no JVD LYMPH:  no palpable lymphadenopathy in the cervical, axillary or inguinal regions LUNGS: clear to auscultation b/l with normal respiratory effort HEART: regular rate & rhythm ABDOMEN:  normoactive bowel sounds , non tender, not distended. No palpable hepatosplenomegaly.  Extremity: 1+ pedal edema PSYCH: alert & oriented x 3 with fluent speech NEURO: no focal motor/sensory deficits  LABORATORY DATA:  I have reviewed the data as listed  . CBC Latest Ref Rng & Units 09/05/2019 08/31/2019 08/23/2019  WBC 4.0 - 10.5 K/uL 205.6(HH) 226.4(HH) 184.3(HH)  Hemoglobin 13.0 - 17.0 g/dL 9.7(L) 9.2(L) 9.7(L)  Hematocrit 39.0 - 52.0 % 30.9(L) 30.2(L) 31.5(L)  Platelets 150 - 400 K/uL 129(L) 137(L) 123(L)   . CBC    Component Value Date/Time   WBC 205.6 (HH) 09/05/2019 0855   WBC 142.5 (HH) 05/06/2019 1024   RBC 2.96 (L) 09/05/2019 0855   HGB 9.7 (L) 09/05/2019 0855   HCT 30.9 (L) 09/05/2019 0855   PLT 129 (L) 09/05/2019 0855   MCV 104.4 (H) 09/05/2019 0855   MCH 32.8 09/05/2019 0855   MCHC 31.4 09/05/2019 0855   RDW 16.2 (H) 09/05/2019 0855   LYMPHSABS 191.2 (H) 09/05/2019 9935  MONOABS 2.1 (H) 09/05/2019 0855   EOSABS 0.0 09/05/2019 0855   BASOSABS 0.0 09/05/2019 0855    . CMP Latest Ref Rng & Units 09/05/2019 08/31/2019 08/23/2019  Glucose 70 - 99 mg/dL 89 79 109(H)  BUN 8 - 23 mg/dL 31(H) 45(H) 33(H)  Creatinine 0.61 - 1.24 mg/dL 1.92(H) 2.15(H) 2.00(H)  Sodium 135 - 145 mmol/L 143 143 142  Potassium 3.5 - 5.1 mmol/L 3.9 3.7 4.7    Chloride 98 - 111 mmol/L 110 107 108  CO2 22 - 32 mmol/L 25 25 25   Calcium 8.9 - 10.3 mg/dL 8.6(L) 8.7(L) 8.7(L)  Total Protein 6.5 - 8.1 g/dL 6.1(L) 6.1(L) 6.1(L)  Total Bilirubin 0.3 - 1.2 mg/dL 0.6 0.5 0.6  Alkaline Phos 38 - 126 U/L 116 120 110  AST 15 - 41 U/L 19 15 15   ALT 0 - 44 U/L 15 10 12     . Lab Results  Component Value Date   LDH 256 (H) 08/23/2019   11/24/17 FISH CLL Prognostic:     RADIOGRAPHIC STUDIES: I have personally reviewed the radiological images as listed and agreed with the findings in the report. No results found.   ASSESSMENT & PLAN:   Benjamin Watkins is a 86 y.o. caucasian male with    1. Chronic Lymphocytic Leukemia The above w/u was reviewed and diagnosis of CLL was made based on PBS and flow cytometry results Likely Rai 0 Patient has thrombocytopenia PLT 117k (not <100k to count as staging criteria) No anemia or clinical splenomegaly 11/24/17 FISH CLL Prognostic panel did not reveal a standard mutation.  Lab Results  Component Value Date   LDH 256 (H) 08/23/2019   PLAN:  -Discussed pt labwork today, 09/05/19; WBC is slowly declining, other blood counts and chemistries are holding steady -Discussed 09/05/2019 Uric acid at 6.4 - no evidence of tumor lysis  -Advised pt that he will need to eat a meal or snack 5 hours after taking his 70/30 insulin to prevent his blood glucose levels from dropping  -Recommended that the pt continue to eat well, drink at least 48-64 oz of water each day, and walk 20-30 minutes each day.  -Will hold off on IV fluids as long as pt is staying well hydrated to prevent increased leg swelling -Recommend pt continue to wear compression socks and elevate legs  -Will continue to monitor with labs twice per week -The pt has no prohibitive toxicities from continuing Venetoclax daily at this time -Continue 100 mg Allopurinol BID -Recommend pt discuss insulin dosage with Dr. Brigitte Pulse -Will see back in 1 week   FOLLOW  UP: -Continue labs and IVF as scheduled twice weekly x 3 weeks -MD visit in 1 week and 2 weeks   The total time spent in the appt was 20 minutes and more than 50% was on counseling and direct patient cares.  All of the patient's questions were answered with apparent satisfaction. The patient knows to call the clinic with any problems, questions or concerns.    Sullivan Lone MD [place] AAHIVMS [facility] Hematology/Oncology Physician [facility]  (Office):       [phone] (Work cell):  [phone] (Fax):           [phone]  09/05/2019 10:31 AM  I, Yevette Edwards, am acting as a scribe for Dr. Sullivan Lone.   .I have reviewed the above documentation for accuracy and completeness, and I agree with the above. Brunetta Genera MD

## 2019-09-05 NOTE — Progress Notes (Signed)
CRITICAL VALUE STICKER  CRITICAL VALUE: WBC 205.6  DATE & TIME NOTIFIED: 0920  MD NOTIFIED: Dr. Sullivan Lone  TIME OF NOTIFICATION: 4179  RESPONSE: MD will address.

## 2019-09-08 ENCOUNTER — Inpatient Hospital Stay: Payer: Medicare Other

## 2019-09-08 ENCOUNTER — Other Ambulatory Visit: Payer: Self-pay

## 2019-09-08 DIAGNOSIS — E119 Type 2 diabetes mellitus without complications: Secondary | ICD-10-CM | POA: Diagnosis not present

## 2019-09-08 DIAGNOSIS — R21 Rash and other nonspecific skin eruption: Secondary | ICD-10-CM | POA: Diagnosis not present

## 2019-09-08 DIAGNOSIS — C9110 Chronic lymphocytic leukemia of B-cell type not having achieved remission: Secondary | ICD-10-CM

## 2019-09-08 DIAGNOSIS — R634 Abnormal weight loss: Secondary | ICD-10-CM | POA: Diagnosis not present

## 2019-09-08 DIAGNOSIS — D696 Thrombocytopenia, unspecified: Secondary | ICD-10-CM | POA: Diagnosis not present

## 2019-09-08 DIAGNOSIS — M25472 Effusion, left ankle: Secondary | ICD-10-CM | POA: Diagnosis not present

## 2019-09-08 LAB — CMP (CANCER CENTER ONLY)
ALT: 13 U/L (ref 0–44)
AST: 15 U/L (ref 15–41)
Albumin: 3.8 g/dL (ref 3.5–5.0)
Alkaline Phosphatase: 112 U/L (ref 38–126)
Anion gap: 7 (ref 5–15)
BUN: 34 mg/dL — ABNORMAL HIGH (ref 8–23)
CO2: 26 mmol/L (ref 22–32)
Calcium: 8.5 mg/dL — ABNORMAL LOW (ref 8.9–10.3)
Chloride: 107 mmol/L (ref 98–111)
Creatinine: 2.07 mg/dL — ABNORMAL HIGH (ref 0.61–1.24)
GFR, Est AFR Am: 33 mL/min — ABNORMAL LOW (ref >60)
GFR, Estimated: 29 mL/min — ABNORMAL LOW (ref >60)
Glucose, Bld: 60 mg/dL — ABNORMAL LOW (ref 70–99)
Potassium: 3.6 mmol/L (ref 3.5–5.1)
Sodium: 140 mmol/L (ref 135–145)
Total Bilirubin: 0.6 mg/dL (ref 0.3–1.2)
Total Protein: 5.9 g/dL — ABNORMAL LOW (ref 6.5–8.1)

## 2019-09-08 LAB — CBC WITH DIFFERENTIAL (CANCER CENTER ONLY)
Abs Immature Granulocytes: 0 10*3/uL (ref 0.00–0.07)
Basophils Absolute: 0 10*3/uL (ref 0.0–0.1)
Basophils Relative: 0 %
Eosinophils Absolute: 1.9 10*3/uL — ABNORMAL HIGH (ref 0.0–0.5)
Eosinophils Relative: 1 %
HCT: 29 % — ABNORMAL LOW (ref 39.0–52.0)
Hemoglobin: 9.1 g/dL — ABNORMAL LOW (ref 13.0–17.0)
Lymphocytes Relative: 93 %
Lymphs Abs: 180.6 10*3/uL — ABNORMAL HIGH (ref 0.7–4.0)
MCH: 32.7 pg (ref 26.0–34.0)
MCHC: 31.4 g/dL (ref 30.0–36.0)
MCV: 104.3 fL — ABNORMAL HIGH (ref 80.0–100.0)
Monocytes Absolute: 3.9 10*3/uL — ABNORMAL HIGH (ref 0.1–1.0)
Monocytes Relative: 2 %
Neutro Abs: 7.8 10*3/uL — ABNORMAL HIGH (ref 1.7–7.7)
Neutrophils Relative %: 4 %
Platelet Count: 122 10*3/uL — ABNORMAL LOW (ref 150–400)
RBC: 2.78 MIL/uL — ABNORMAL LOW (ref 4.22–5.81)
RDW: 16 % — ABNORMAL HIGH (ref 11.5–15.5)
WBC Count: 194.2 10*3/uL (ref 4.0–10.5)
nRBC: 0 % (ref 0.0–0.2)

## 2019-09-08 LAB — URIC ACID: Uric Acid, Serum: 6.3 mg/dL (ref 3.7–8.6)

## 2019-09-08 NOTE — Progress Notes (Signed)
Per Katharine Look, RN (Dr. Grier Mitts nurse): patient would not be needing IV fluids today. Patient informed and he was agreeable with this. Copy of Lab results provided to patient.

## 2019-09-12 ENCOUNTER — Other Ambulatory Visit: Payer: Self-pay

## 2019-09-12 ENCOUNTER — Inpatient Hospital Stay: Payer: Medicare Other

## 2019-09-12 DIAGNOSIS — M25472 Effusion, left ankle: Secondary | ICD-10-CM | POA: Diagnosis not present

## 2019-09-12 DIAGNOSIS — D696 Thrombocytopenia, unspecified: Secondary | ICD-10-CM | POA: Diagnosis not present

## 2019-09-12 DIAGNOSIS — C9110 Chronic lymphocytic leukemia of B-cell type not having achieved remission: Secondary | ICD-10-CM

## 2019-09-12 DIAGNOSIS — R21 Rash and other nonspecific skin eruption: Secondary | ICD-10-CM | POA: Diagnosis not present

## 2019-09-12 DIAGNOSIS — E119 Type 2 diabetes mellitus without complications: Secondary | ICD-10-CM | POA: Diagnosis not present

## 2019-09-12 DIAGNOSIS — R634 Abnormal weight loss: Secondary | ICD-10-CM | POA: Diagnosis not present

## 2019-09-12 LAB — CBC WITH DIFFERENTIAL (CANCER CENTER ONLY)
Abs Immature Granulocytes: 0 10*3/uL (ref 0.00–0.07)
Basophils Absolute: 0 10*3/uL (ref 0.0–0.1)
Basophils Relative: 0 %
Eosinophils Absolute: 0 10*3/uL (ref 0.0–0.5)
Eosinophils Relative: 0 %
HCT: 29.3 % — ABNORMAL LOW (ref 39.0–52.0)
Hemoglobin: 9.2 g/dL — ABNORMAL LOW (ref 13.0–17.0)
Lymphocytes Relative: 90 %
Lymphs Abs: 140 10*3/uL — ABNORMAL HIGH (ref 0.7–4.0)
MCH: 32.6 pg (ref 26.0–34.0)
MCHC: 31.4 g/dL (ref 30.0–36.0)
MCV: 103.9 fL — ABNORMAL HIGH (ref 80.0–100.0)
Monocytes Absolute: 0 10*3/uL — ABNORMAL LOW (ref 0.1–1.0)
Monocytes Relative: 0 %
Neutro Abs: 15.6 10*3/uL — ABNORMAL HIGH (ref 1.7–7.7)
Neutrophils Relative %: 10 %
Platelet Count: 123 10*3/uL — ABNORMAL LOW (ref 150–400)
RBC: 2.82 MIL/uL — ABNORMAL LOW (ref 4.22–5.81)
RDW: 16.2 % — ABNORMAL HIGH (ref 11.5–15.5)
WBC Count: 155.5 10*3/uL (ref 4.0–10.5)
nRBC: 0 % (ref 0.0–0.2)

## 2019-09-12 LAB — CMP (CANCER CENTER ONLY)
ALT: 16 U/L (ref 0–44)
AST: 17 U/L (ref 15–41)
Albumin: 3.7 g/dL (ref 3.5–5.0)
Alkaline Phosphatase: 119 U/L (ref 38–126)
Anion gap: 8 (ref 5–15)
BUN: 30 mg/dL — ABNORMAL HIGH (ref 8–23)
CO2: 23 mmol/L (ref 22–32)
Calcium: 8.2 mg/dL — ABNORMAL LOW (ref 8.9–10.3)
Chloride: 108 mmol/L (ref 98–111)
Creatinine: 1.91 mg/dL — ABNORMAL HIGH (ref 0.61–1.24)
GFR, Est AFR Am: 36 mL/min — ABNORMAL LOW (ref >60)
GFR, Estimated: 31 mL/min — ABNORMAL LOW (ref >60)
Glucose, Bld: 119 mg/dL — ABNORMAL HIGH (ref 70–99)
Potassium: 4.3 mmol/L (ref 3.5–5.1)
Sodium: 139 mmol/L (ref 135–145)
Total Bilirubin: 0.6 mg/dL (ref 0.3–1.2)
Total Protein: 5.9 g/dL — ABNORMAL LOW (ref 6.5–8.1)

## 2019-09-12 LAB — URIC ACID: Uric Acid, Serum: 5.7 mg/dL (ref 3.7–8.6)

## 2019-09-12 MED ORDER — LIDOCAINE-EPINEPHRINE 1 %-1:100000 IJ SOLN
INTRAMUSCULAR | Status: AC
  Filled 2019-09-12: qty 1

## 2019-09-12 NOTE — Progress Notes (Signed)
PEr Dr [name], no IVFs needed today.  Pt discharged to home with no further needs..  Pt aware of next appt date and time.

## 2019-09-14 ENCOUNTER — Other Ambulatory Visit: Payer: Medicare Other

## 2019-09-14 ENCOUNTER — Inpatient Hospital Stay: Payer: Medicare Other

## 2019-09-14 ENCOUNTER — Other Ambulatory Visit: Payer: Self-pay

## 2019-09-14 DIAGNOSIS — M25472 Effusion, left ankle: Secondary | ICD-10-CM | POA: Diagnosis not present

## 2019-09-14 DIAGNOSIS — R634 Abnormal weight loss: Secondary | ICD-10-CM | POA: Diagnosis not present

## 2019-09-14 DIAGNOSIS — C9110 Chronic lymphocytic leukemia of B-cell type not having achieved remission: Secondary | ICD-10-CM

## 2019-09-14 DIAGNOSIS — R21 Rash and other nonspecific skin eruption: Secondary | ICD-10-CM | POA: Diagnosis not present

## 2019-09-14 DIAGNOSIS — E119 Type 2 diabetes mellitus without complications: Secondary | ICD-10-CM | POA: Diagnosis not present

## 2019-09-14 DIAGNOSIS — D696 Thrombocytopenia, unspecified: Secondary | ICD-10-CM | POA: Diagnosis not present

## 2019-09-14 LAB — CMP (CANCER CENTER ONLY)
ALT: 21 U/L (ref 0–44)
AST: 21 U/L (ref 15–41)
Albumin: 3.6 g/dL (ref 3.5–5.0)
Alkaline Phosphatase: 105 U/L (ref 38–126)
Anion gap: 8 (ref 5–15)
BUN: 29 mg/dL — ABNORMAL HIGH (ref 8–23)
CO2: 24 mmol/L (ref 22–32)
Calcium: 8 mg/dL — ABNORMAL LOW (ref 8.9–10.3)
Chloride: 108 mmol/L (ref 98–111)
Creatinine: 2.01 mg/dL — ABNORMAL HIGH (ref 0.61–1.24)
GFR, Est AFR Am: 34 mL/min — ABNORMAL LOW (ref >60)
GFR, Estimated: 30 mL/min — ABNORMAL LOW (ref >60)
Glucose, Bld: 114 mg/dL — ABNORMAL HIGH (ref 70–99)
Potassium: 3.9 mmol/L (ref 3.5–5.1)
Sodium: 140 mmol/L (ref 135–145)
Total Bilirubin: 0.6 mg/dL (ref 0.3–1.2)
Total Protein: 5.5 g/dL — ABNORMAL LOW (ref 6.5–8.1)

## 2019-09-14 LAB — CBC WITH DIFFERENTIAL (CANCER CENTER ONLY)
Abs Immature Granulocytes: 0 10*3/uL (ref 0.00–0.07)
Basophils Absolute: 0 10*3/uL (ref 0.0–0.1)
Basophils Relative: 0 %
Eosinophils Absolute: 0 10*3/uL (ref 0.0–0.5)
Eosinophils Relative: 0 %
HCT: 28.6 % — ABNORMAL LOW (ref 39.0–52.0)
Hemoglobin: 9.1 g/dL — ABNORMAL LOW (ref 13.0–17.0)
Lymphocytes Relative: 92 %
Lymphs Abs: 125.1 10*3/uL — ABNORMAL HIGH (ref 0.7–4.0)
MCH: 33.1 pg (ref 26.0–34.0)
MCHC: 31.8 g/dL (ref 30.0–36.0)
MCV: 104 fL — ABNORMAL HIGH (ref 80.0–100.0)
Monocytes Absolute: 4.1 10*3/uL — ABNORMAL HIGH (ref 0.1–1.0)
Monocytes Relative: 3 %
Neutro Abs: 6.8 10*3/uL (ref 1.7–7.7)
Neutrophils Relative %: 5 %
Platelet Count: 115 10*3/uL — ABNORMAL LOW (ref 150–400)
RBC: 2.75 MIL/uL — ABNORMAL LOW (ref 4.22–5.81)
RDW: 16.1 % — ABNORMAL HIGH (ref 11.5–15.5)
WBC Count: 136 10*3/uL (ref 4.0–10.5)
nRBC: 0 % (ref 0.0–0.2)

## 2019-09-14 LAB — URIC ACID: Uric Acid, Serum: 5.8 mg/dL (ref 3.7–8.6)

## 2019-09-14 NOTE — Progress Notes (Signed)
Labs reviewed by Dr. [name] who determined patient did not need IV fluids today. Copy of lab work printed and given to patient. Patient sent home for today.

## 2019-09-15 ENCOUNTER — Inpatient Hospital Stay: Payer: Medicare Other

## 2019-09-19 ENCOUNTER — Other Ambulatory Visit: Payer: Self-pay | Admitting: Hematology

## 2019-09-19 ENCOUNTER — Other Ambulatory Visit: Payer: Self-pay

## 2019-09-19 ENCOUNTER — Telehealth: Payer: Self-pay | Admitting: *Deleted

## 2019-09-19 ENCOUNTER — Inpatient Hospital Stay: Payer: Medicare Other

## 2019-09-19 DIAGNOSIS — R634 Abnormal weight loss: Secondary | ICD-10-CM | POA: Diagnosis not present

## 2019-09-19 DIAGNOSIS — C9110 Chronic lymphocytic leukemia of B-cell type not having achieved remission: Secondary | ICD-10-CM | POA: Diagnosis not present

## 2019-09-19 DIAGNOSIS — R21 Rash and other nonspecific skin eruption: Secondary | ICD-10-CM | POA: Diagnosis not present

## 2019-09-19 DIAGNOSIS — M25472 Effusion, left ankle: Secondary | ICD-10-CM | POA: Diagnosis not present

## 2019-09-19 DIAGNOSIS — D696 Thrombocytopenia, unspecified: Secondary | ICD-10-CM | POA: Diagnosis not present

## 2019-09-19 DIAGNOSIS — E119 Type 2 diabetes mellitus without complications: Secondary | ICD-10-CM | POA: Diagnosis not present

## 2019-09-19 LAB — CBC WITH DIFFERENTIAL (CANCER CENTER ONLY)
Abs Immature Granulocytes: 0 10*3/uL (ref 0.00–0.07)
Basophils Absolute: 0 10*3/uL (ref 0.0–0.1)
Basophils Relative: 0 %
Eosinophils Absolute: 0 10*3/uL (ref 0.0–0.5)
Eosinophils Relative: 0 %
HCT: 29.1 % — ABNORMAL LOW (ref 39.0–52.0)
Hemoglobin: 9.2 g/dL — ABNORMAL LOW (ref 13.0–17.0)
Lymphocytes Relative: 90 %
Lymphs Abs: 84.8 10*3/uL — ABNORMAL HIGH (ref 0.7–4.0)
MCH: 32.2 pg (ref 26.0–34.0)
MCHC: 31.6 g/dL (ref 30.0–36.0)
MCV: 101.7 fL — ABNORMAL HIGH (ref 80.0–100.0)
Monocytes Absolute: 0.9 10*3/uL (ref 0.1–1.0)
Monocytes Relative: 1 %
Neutro Abs: 8.5 10*3/uL — ABNORMAL HIGH (ref 1.7–7.7)
Neutrophils Relative %: 9 %
Platelet Count: 102 10*3/uL — ABNORMAL LOW (ref 150–400)
RBC: 2.86 MIL/uL — ABNORMAL LOW (ref 4.22–5.81)
RDW: 16 % — ABNORMAL HIGH (ref 11.5–15.5)
WBC Count: 94.2 10*3/uL (ref 4.0–10.5)
nRBC: 0 % (ref 0.0–0.2)

## 2019-09-19 LAB — CMP (CANCER CENTER ONLY)
ALT: 43 U/L (ref 0–44)
AST: 36 U/L (ref 15–41)
Albumin: 3.6 g/dL (ref 3.5–5.0)
Alkaline Phosphatase: 129 U/L — ABNORMAL HIGH (ref 38–126)
Anion gap: 10 (ref 5–15)
BUN: 32 mg/dL — ABNORMAL HIGH (ref 8–23)
CO2: 23 mmol/L (ref 22–32)
Calcium: 8.5 mg/dL — ABNORMAL LOW (ref 8.9–10.3)
Chloride: 106 mmol/L (ref 98–111)
Creatinine: 1.95 mg/dL — ABNORMAL HIGH (ref 0.61–1.24)
GFR, Est AFR Am: 36 mL/min — ABNORMAL LOW (ref >60)
GFR, Estimated: 31 mL/min — ABNORMAL LOW (ref >60)
Glucose, Bld: 116 mg/dL — ABNORMAL HIGH (ref 70–99)
Potassium: 4 mmol/L (ref 3.5–5.1)
Sodium: 139 mmol/L (ref 135–145)
Total Bilirubin: 0.6 mg/dL (ref 0.3–1.2)
Total Protein: 5.7 g/dL — ABNORMAL LOW (ref 6.5–8.1)

## 2019-09-19 LAB — URIC ACID: Uric Acid, Serum: 6.1 mg/dL (ref 3.7–8.6)

## 2019-09-19 NOTE — Telephone Encounter (Signed)
1300: Jay/lab called: WBC 94.2. 1318: Dr. [name] informed. Information acknowledged

## 2019-09-19 NOTE — Progress Notes (Signed)
Labs reviewed by Dr. [name] and determination was made patient does not need IVF today. Patient provided with a copy of labs and advised he did not need any IV Fluids today. Patient discharged from waiting room and appreciated good news.

## 2019-09-22 ENCOUNTER — Inpatient Hospital Stay (HOSPITAL_BASED_OUTPATIENT_CLINIC_OR_DEPARTMENT_OTHER): Payer: Medicare Other | Admitting: Hematology

## 2019-09-22 ENCOUNTER — Inpatient Hospital Stay: Payer: Medicare Other

## 2019-09-22 ENCOUNTER — Other Ambulatory Visit: Payer: Self-pay

## 2019-09-22 ENCOUNTER — Other Ambulatory Visit: Payer: Medicare Other

## 2019-09-22 VITALS — BP 108/39 | HR 73 | Temp 98.9°F | Resp 18 | Ht 74.0 in | Wt 238.2 lb

## 2019-09-22 DIAGNOSIS — D696 Thrombocytopenia, unspecified: Secondary | ICD-10-CM | POA: Diagnosis not present

## 2019-09-22 DIAGNOSIS — M25472 Effusion, left ankle: Secondary | ICD-10-CM | POA: Diagnosis not present

## 2019-09-22 DIAGNOSIS — Z9189 Other specified personal risk factors, not elsewhere classified: Secondary | ICD-10-CM | POA: Diagnosis not present

## 2019-09-22 DIAGNOSIS — E119 Type 2 diabetes mellitus without complications: Secondary | ICD-10-CM | POA: Diagnosis not present

## 2019-09-22 DIAGNOSIS — D649 Anemia, unspecified: Secondary | ICD-10-CM

## 2019-09-22 DIAGNOSIS — R634 Abnormal weight loss: Secondary | ICD-10-CM | POA: Diagnosis not present

## 2019-09-22 DIAGNOSIS — C9110 Chronic lymphocytic leukemia of B-cell type not having achieved remission: Secondary | ICD-10-CM

## 2019-09-22 DIAGNOSIS — R21 Rash and other nonspecific skin eruption: Secondary | ICD-10-CM | POA: Diagnosis not present

## 2019-09-22 LAB — CBC WITH DIFFERENTIAL (CANCER CENTER ONLY)
Abs Immature Granulocytes: 0 10*3/uL (ref 0.00–0.07)
Basophils Absolute: 0 10*3/uL (ref 0.0–0.1)
Basophils Relative: 0 %
Eosinophils Absolute: 0.8 10*3/uL — ABNORMAL HIGH (ref 0.0–0.5)
Eosinophils Relative: 1 %
HCT: 30.4 % — ABNORMAL LOW (ref 39.0–52.0)
Hemoglobin: 9.4 g/dL — ABNORMAL LOW (ref 13.0–17.0)
Lymphocytes Relative: 87 %
Lymphs Abs: 67.5 10*3/uL — ABNORMAL HIGH (ref 0.7–4.0)
MCH: 32.3 pg (ref 26.0–34.0)
MCHC: 30.9 g/dL (ref 30.0–36.0)
MCV: 104.5 fL — ABNORMAL HIGH (ref 80.0–100.0)
Monocytes Absolute: 1.6 10*3/uL — ABNORMAL HIGH (ref 0.1–1.0)
Monocytes Relative: 2 %
Neutro Abs: 7.8 10*3/uL — ABNORMAL HIGH (ref 1.7–7.7)
Neutrophils Relative %: 10 %
Platelet Count: 108 10*3/uL — ABNORMAL LOW (ref 150–400)
RBC: 2.91 MIL/uL — ABNORMAL LOW (ref 4.22–5.81)
RDW: 16.1 % — ABNORMAL HIGH (ref 11.5–15.5)
WBC Count: 77.6 10*3/uL (ref 4.0–10.5)
nRBC: 0 % (ref 0.0–0.2)

## 2019-09-22 LAB — CMP (CANCER CENTER ONLY)
ALT: 34 U/L (ref 0–44)
AST: 23 U/L (ref 15–41)
Albumin: 3.7 g/dL (ref 3.5–5.0)
Alkaline Phosphatase: 145 U/L — ABNORMAL HIGH (ref 38–126)
Anion gap: 10 (ref 5–15)
BUN: 32 mg/dL — ABNORMAL HIGH (ref 8–23)
CO2: 24 mmol/L (ref 22–32)
Calcium: 8.7 mg/dL — ABNORMAL LOW (ref 8.9–10.3)
Chloride: 107 mmol/L (ref 98–111)
Creatinine: 2.16 mg/dL — ABNORMAL HIGH (ref 0.61–1.24)
GFR, Est AFR Am: 31 mL/min — ABNORMAL LOW (ref >60)
GFR, Estimated: 27 mL/min — ABNORMAL LOW (ref >60)
Glucose, Bld: 115 mg/dL — ABNORMAL HIGH (ref 70–99)
Potassium: 4.4 mmol/L (ref 3.5–5.1)
Sodium: 141 mmol/L (ref 135–145)
Total Bilirubin: 0.7 mg/dL (ref 0.3–1.2)
Total Protein: 5.9 g/dL — ABNORMAL LOW (ref 6.5–8.1)

## 2019-09-22 LAB — URIC ACID: Uric Acid, Serum: 6.7 mg/dL (ref 3.7–8.6)

## 2019-09-22 MED FILL — ALLOPURINOL 100 MG TABS: 100 | 30 days supply | Qty: 60 | Fill #1

## 2019-09-22 NOTE — Progress Notes (Signed)
HEMATOLOGY/ONCOLOGY CLINIC NOTE  Date of Service: 09/22/2019  Patient Care Team: [name], MD as PCP - General (Family Medicine)  CHIEF COMPLAINTS/PURPOSE OF CONSULTATION:  F/u for CLL  HISTORY OF PRESENTING ILLNESS:   Benjamin Watkins is a wonderful 86 y.o. male who has been referred to [place] by his PCP, Dr. Brigitte Pulse from [facility] for evaluation and management of elevated WBC.  Of note, Urine test on 11/20/16 show 2+ WBC in urine. Labs on 05/22/17 showed WBC at 18.7. PCP referred him with concern for a smoldering blood cancer. 06/26/17 Labs shows increase of WBC to 21.3.   He presents to the clinic today noting he was first told about elevated WBC in 03/2017. He notes he has felt normal overall, asymptomatic. He has had a few UTIs when he was told about his elevated in WBC. He notes to having a recent eye infection. He is still has residual infection and is continuing treatment.  He notes he has been slowly losing weight purposefully over the last 3 years as he has been watching his weight. He last about 30 pounds. In the past 6 months he lost 2-4 pounds. He lives with his wife and is able to get around well. He notes his age is starting to effect him but he is independent still. He is a currently retired Glass blower/designer.   In the past he was diagnosed with diabetes. He has never been a smoker and does not drink alcohol. He has not been on steroids. He has not received blood transfusion in the past nor has he had tattoos.   On review of symptoms, pt notes to eye infection symptoms. He denies change in breathing, SOB.  He denies abdominal pain, chills, night sweats, no new skin rashes and bums. He has swelling in his left ankle from an ongoing rash which is monitored by Dermatologist. He has occasional arthritis in his arms and knees with back problems at times. He reports to purposeful slow weight loss.    INTERVAL HISTORY:   Benjamin Watkins is here for management and  evaluation of his CLL. The patient's last visit with [place] was on 09/05/2019. The pt reports that he is doing well overall.  The pt reports that he has had no issues with Venetoclax so far. Pt is currently taking 100 mg of Venetoclax per day. He will begin taking 200 mg on Saturday. Pt has continued eating well and drinking 64 oz of water per day. His leg swelling has been stable and his urine looks clear.   Lab results today (09/22/19) of CBC w/diff and CMP is as follows: all values are WNL except for WBC at 77.6K, RBC at 2.91, Hgb at 9.4, HCT at 30.4, MCV at 104.5, RDW at 16.1, PLT at 108K, Neutro Abs at 7.8K, Lymphs Abs at 67.5K, Mono Abs at 1.6K, Eos Abs at 0.8K, WBC Morphology shows "Variant Lymphs", Smudge Cells are "Present", Glucose at 115, BUN at 32, Creatinine at 2.16, Calcium at 8.7, Total Protein at 5.9, ALP at 145, GFR Est Non Af Am at 27. 09/22/2019 Uric acid at 6.7  On review of systems, pt reports healthy appetite and denies N/V/D, worsening leg selling, new lumps/bumps, fevers, chills, night sweats and any other symptoms.   MEDICAL HISTORY:  Past Medical History:  Diagnosis Date  . Diabetes mellitus without complication ([place])   . Diverticulosis   . History of colon polyps   . Hypertension   . Low back pain   .  Right foot drop     SURGICAL HISTORY: Past Surgical History:  Procedure Laterality Date  . CHOLECYSTECTOMY     laparoscopic  . COLONOSCOPY WITH PROPOFOL N/A 12/10/2015   Procedure: COLONOSCOPY WITH PROPOFOL;  Surgeon: Garlan Fair, MD;  Location: [facility];  Service: Endoscopy;  Laterality: N/A;  . EYE SURGERY Bilateral    bleeding retina  . TONSILLECTOMY      SOCIAL HISTORY: Social History   Socioeconomic History  . Marital status: Married    Spouse name: Not on file  . Number of children: 2  . Years of education: some college  . Highest education level: Not on file  Occupational History  . Occupation: Retired  Tobacco Use  . Smoking status:  Never Smoker  . Smokeless tobacco: Never Used  Substance and Sexual Activity  . Alcohol use: No  . Drug use: No  . Sexual activity: Not on file  Other Topics Concern  . Not on file  Social History Narrative   Lives at home with his wife.   Right-handed.   0.5 cup of coffee each morning, some tea.   Social Determinants of Health   Financial Resource Strain:   . Difficulty of Paying Living Expenses: Not on file  Food Insecurity:   . Worried About Charity fundraiser in the Last Year: Not on file  . Ran Out of Food in the Last Year: Not on file  Transportation Needs:   . Lack of Transportation (Medical): Not on file  . Lack of Transportation (Non-Medical): Not on file  Physical Activity:   . Days of Exercise per Week: Not on file  . Minutes of Exercise per Session: Not on file  Stress:   . Feeling of Stress : Not on file  Social Connections:   . Frequency of Communication with Friends and Family: Not on file  . Frequency of Social Gatherings with Friends and Family: Not on file  . Attends Religious Services: Not on file  . Active Member of Clubs or Organizations: Not on file  . Attends Archivist Meetings: Not on file  . Marital Status: Not on file  Intimate Partner Violence:   . Fear of Current or Ex-Partner: Not on file  . Emotionally Abused: Not on file  . Physically Abused: Not on file  . Sexually Abused: Not on file    FAMILY HISTORY: Family History  Problem Relation Age of Onset  . Stroke Mother   . Diabetes Mother   . Other Father        "old age"    ALLERGIES:  is allergic to hydrochlorothiazide and lisinopril.  MEDICATIONS:  Current Outpatient Medications  Medication Sig Dispense Refill  . acetaminophen (TYLENOL) 325 MG tablet Take 650 mg by mouth 2 (two) times daily.    Marland Kitchen allopurinol (ZYLOPRIM) 100 MG tablet Take 1 tablet (100 mg total) by mouth 2 (two) times daily. 60 tablet 1  . aspirin EC 81 MG tablet Take 81 mg by mouth daily.    .  carvedilol (COREG) 25 MG tablet Take 25 mg by mouth 2 (two) times daily with a meal.    . chlorthalidone (HYGROTON) 25 MG tablet Take 25 mg by mouth daily.    . Cyanocobalamin (VITAMIN B-12) 1000 MCG SUBL PLACE 1  UNDER THE TONGUE ONCE DAILY 30 each 0  . glimepiride (AMARYL) 2 MG tablet Take 2 mg by mouth daily with breakfast.    . hydrALAZINE (APRESOLINE) 25 MG tablet Take 25  mg by mouth 2 (two) times daily.     . niacin 500 MG tablet Take 500 mg by mouth at bedtime.    Marland Kitchen NOVOLIN 70/30 RELION (70-30) 100 UNIT/ML injection Inject 70-74 Units into the skin 2 (two) times daily. Takes 74 units in the morning and 70 units at night.    Marland Kitchen omeprazole (PRILOSEC) 20 MG capsule Take 20 mg by mouth daily.    Vladimir Faster Glycol-Propyl Glycol (SYSTANE ULTRA OP) Place 2 drops into both eyes daily as needed (For dry eyes.).    Marland Kitchen venetoclax 10 & 50 & 100 MG TBPK Take 20mg  daily with food for 7days, then 50mg  daily for 7days, then 100mg  daily for 7days, then 200mg  daily for 7days. 1 each 0  . venetoclax 10 MG TABS Take 20 mg by mouth daily. (Patient not taking: Reported on 09/22/2019) 14 tablet 0   No current facility-administered medications for this visit.    REVIEW OF SYSTEMS:  A 10+ POINT REVIEW OF SYSTEMS WAS OBTAINED including neurology, dermatology, psychiatry, cardiac, respiratory, lymph, extremities, GI, GU, Musculoskeletal, constitutional, breasts, reproductive, HEENT.  All pertinent positives are noted in the HPI.  All others are negative.   PHYSICAL EXAMINATION:  ECOG FS:2 - Symptomatic, <50% confined to bed  .BP (!) 108/39 (BP Location: Left Arm, Patient Position: Sitting) Comment: Notified Nurse of BP  Pulse 73   Temp 98.9 F (37.2 C) (Temporal)   Resp 18   Ht 6\' 2"  (1.88 m)   Wt 238 lb 3.2 oz (108 kg)   SpO2 100%   BMI 30.58 kg/m   Vitals:   09/22/19 1218  BP: (!) 108/39  Pulse: 73  Resp: 18  Temp: 98.9 F (37.2 C)  SpO2: 100%   Wt Readings from Last 3 Encounters:  09/22/19 238  lb 3.2 oz (108 kg)  09/12/19 232 lb 8 oz (105.5 kg)  09/05/19 238 lb 11.2 oz (108.3 kg)   Body mass index is 30.58 kg/m.    GENERAL:alert, in no acute distress and comfortable SKIN: no acute rashes, no significant lesions EYES: conjunctiva are pink and non-injected, sclera anicteric OROPHARYNX: MMM, no exudates, no oropharyngeal erythema or ulceration NECK: supple, no JVD LYMPH:  no palpable lymphadenopathy in the cervical, axillary or inguinal regions LUNGS: clear to auscultation b/l with normal respiratory effort HEART: regular rate & rhythm ABDOMEN:  normoactive bowel sounds , non tender, not distended. No palpable hepatosplenomegaly.  Extremity: 2+ pedal edema left, 1+ pedal edema right  PSYCH: alert & oriented x 3 with fluent speech NEURO: no focal motor/sensory deficits  LABORATORY DATA:  I have reviewed the data as listed  . CBC Latest Ref Rng & Units 09/22/2019 09/19/2019 09/14/2019  WBC 4.0 - 10.5 K/uL 77.6(HH) 94.2(HH) 136.0(HH)  Hemoglobin 13.0 - 17.0 g/dL 9.4(L) 9.2(L) 9.1(L)  Hematocrit 39.0 - 52.0 % 30.4(L) 29.1(L) 28.6(L)  Platelets 150 - 400 K/uL 108(L) 102(L) 115(L)   . CBC    Component Value Date/Time   WBC 77.6 (HH) 09/22/2019 1152   WBC 142.5 (HH) 05/06/2019 1024   RBC 2.91 (L) 09/22/2019 1152   HGB 9.4 (L) 09/22/2019 1152   HCT 30.4 (L) 09/22/2019 1152   PLT 108 (L) 09/22/2019 1152   MCV 104.5 (H) 09/22/2019 1152   MCH 32.3 09/22/2019 1152   MCHC 30.9 09/22/2019 1152   RDW 16.1 (H) 09/22/2019 1152   LYMPHSABS 67.5 (H) 09/22/2019 1152   MONOABS 1.6 (H) 09/22/2019 1152   EOSABS 0.8 (H) 09/22/2019 1152  BASOSABS 0.0 09/22/2019 1152    . CMP Latest Ref Rng & Units 09/22/2019 09/19/2019 09/14/2019  Glucose 70 - 99 mg/dL 115(H) 116(H) 114(H)  BUN 8 - 23 mg/dL 32(H) 32(H) 29(H)  Creatinine 0.61 - 1.24 mg/dL 2.16(H) 1.95(H) 2.01(H)  Sodium 135 - 145 mmol/L 141 139 140  Potassium 3.5 - 5.1 mmol/L 4.4 4.0 3.9  Chloride 98 - 111 mmol/L 107 106 108  CO2  22 - 32 mmol/L 24 23 24   Calcium 8.9 - 10.3 mg/dL 8.7(L) 8.5(L) 8.0(L)  Total Protein 6.5 - 8.1 g/dL 5.9(L) 5.7(L) 5.5(L)  Total Bilirubin 0.3 - 1.2 mg/dL 0.7 0.6 0.6  Alkaline Phos 38 - 126 U/L 145(H) 129(H) 105  AST 15 - 41 U/L 23 36 21  ALT 0 - 44 U/L 34 43 21    . Lab Results  Component Value Date   LDH 256 (H) 08/23/2019   11/24/17 FISH CLL Prognostic:     RADIOGRAPHIC STUDIES: I have personally reviewed the radiological images as listed and agreed with the findings in the report. No results found.   ASSESSMENT & PLAN:   Benjamin Watkins is a 86 y.o. caucasian male with    1. Chronic Lymphocytic Leukemia The above w/u was reviewed and diagnosis of CLL was made based on PBS and flow cytometry results Likely Rai 0 Patient has thrombocytopenia PLT 117k (not <100k to count as staging criteria) No anemia or clinical splenomegaly 11/24/17 FISH CLL Prognostic panel did not reveal a standard mutation.  Lab Results  Component Value Date   LDH 256 (H) 08/23/2019   PLAN:  -Discussed pt labwork today, 09/22/19; WBC has improved significantly, RBC and PLT are stable, blood chemistries are steady.  -Discussed 09/22/2019 Uric acid is WNL at 6.7 - pt shows no signs of TLS -The pt has no prohibitive toxicities from continuing Venetoclax daily at this time -Will keep at 200 mg Venetoclax for at least one month - seeing good response with lower doses. Refill sent to pharmacy. -Continue 100 mg Allopurinol BID -No indication for fluids today  -Will continue to monitor with labs twice per week -Recommend pt continue to wear compression socks and elevate legs  -Recommended that the pt continue to eat well, drink at least 48-64 oz of water each day, and walk as much as possible each day. -Will see back in 2 weeks with labs    FOLLOW UP: Labs on Monday and Thursday next week  Labs and MD visit the following week   All of the patient's questions were answered with apparent  satisfaction. The patient knows to call the clinic with any problems, questions or concerns.    Sullivan Lone MD [place] AAHIVMS [facility] Hematology/Oncology Physician [facility]  (Office):       [phone] (Work cell):  [phone] (Fax):           [phone]  09/22/2019 1:07 PM  I, Yevette Edwards, am acting as a scribe for Dr. Sullivan Lone.   .I have reviewed the above documentation for accuracy and completeness, and I agree with the above. Brunetta Genera MD

## 2019-09-26 ENCOUNTER — Other Ambulatory Visit: Payer: Self-pay | Admitting: Hematology

## 2019-09-26 ENCOUNTER — Inpatient Hospital Stay: Payer: Medicare Other | Attending: Hematology

## 2019-09-26 ENCOUNTER — Other Ambulatory Visit: Payer: Self-pay

## 2019-09-26 ENCOUNTER — Telehealth: Payer: Self-pay | Admitting: Hematology

## 2019-09-26 DIAGNOSIS — E119 Type 2 diabetes mellitus without complications: Secondary | ICD-10-CM | POA: Diagnosis not present

## 2019-09-26 DIAGNOSIS — C9110 Chronic lymphocytic leukemia of B-cell type not having achieved remission: Secondary | ICD-10-CM | POA: Insufficient documentation

## 2019-09-26 DIAGNOSIS — Z833 Family history of diabetes mellitus: Secondary | ICD-10-CM | POA: Insufficient documentation

## 2019-09-26 DIAGNOSIS — Z79899 Other long term (current) drug therapy: Secondary | ICD-10-CM | POA: Insufficient documentation

## 2019-09-26 DIAGNOSIS — R21 Rash and other nonspecific skin eruption: Secondary | ICD-10-CM | POA: Diagnosis not present

## 2019-09-26 DIAGNOSIS — Z888 Allergy status to other drugs, medicaments and biological substances status: Secondary | ICD-10-CM | POA: Diagnosis not present

## 2019-09-26 DIAGNOSIS — Z823 Family history of stroke: Secondary | ICD-10-CM | POA: Diagnosis not present

## 2019-09-26 DIAGNOSIS — E860 Dehydration: Secondary | ICD-10-CM | POA: Insufficient documentation

## 2019-09-26 DIAGNOSIS — Z794 Long term (current) use of insulin: Secondary | ICD-10-CM | POA: Insufficient documentation

## 2019-09-26 DIAGNOSIS — I10 Essential (primary) hypertension: Secondary | ICD-10-CM | POA: Insufficient documentation

## 2019-09-26 DIAGNOSIS — Z8719 Personal history of other diseases of the digestive system: Secondary | ICD-10-CM | POA: Insufficient documentation

## 2019-09-26 LAB — CMP (CANCER CENTER ONLY)
ALT: 29 U/L (ref 0–44)
AST: 20 U/L (ref 15–41)
Albumin: 3.6 g/dL (ref 3.5–5.0)
Alkaline Phosphatase: 150 U/L — ABNORMAL HIGH (ref 38–126)
Anion gap: 7 (ref 5–15)
BUN: 29 mg/dL — ABNORMAL HIGH (ref 8–23)
CO2: 25 mmol/L (ref 22–32)
Calcium: 8.5 mg/dL — ABNORMAL LOW (ref 8.9–10.3)
Chloride: 105 mmol/L (ref 98–111)
Creatinine: 2.14 mg/dL — ABNORMAL HIGH (ref 0.61–1.24)
GFR, Est AFR Am: 32 mL/min — ABNORMAL LOW (ref >60)
GFR, Estimated: 27 mL/min — ABNORMAL LOW (ref >60)
Glucose, Bld: 87 mg/dL (ref 70–99)
Potassium: 3.9 mmol/L (ref 3.5–5.1)
Sodium: 137 mmol/L (ref 135–145)
Total Bilirubin: 0.6 mg/dL (ref 0.3–1.2)
Total Protein: 5.8 g/dL — ABNORMAL LOW (ref 6.5–8.1)

## 2019-09-26 LAB — CBC WITH DIFFERENTIAL (CANCER CENTER ONLY)
Abs Immature Granulocytes: 0 10*3/uL (ref 0.00–0.07)
Basophils Absolute: 0 10*3/uL (ref 0.0–0.1)
Basophils Relative: 0 %
Eosinophils Absolute: 0 10*3/uL (ref 0.0–0.5)
Eosinophils Relative: 0 %
HCT: 29.1 % — ABNORMAL LOW (ref 39.0–52.0)
Hemoglobin: 9.2 g/dL — ABNORMAL LOW (ref 13.0–17.0)
Lymphocytes Relative: 88 %
Lymphs Abs: 59 10*3/uL — ABNORMAL HIGH (ref 0.7–4.0)
MCH: 32.6 pg (ref 26.0–34.0)
MCHC: 31.6 g/dL (ref 30.0–36.0)
MCV: 103.2 fL — ABNORMAL HIGH (ref 80.0–100.0)
Monocytes Absolute: 1.3 10*3/uL — ABNORMAL HIGH (ref 0.1–1.0)
Monocytes Relative: 2 %
Neutro Abs: 6.7 10*3/uL (ref 1.7–7.7)
Neutrophils Relative %: 10 %
Platelet Count: 117 10*3/uL — ABNORMAL LOW (ref 150–400)
RBC: 2.82 MIL/uL — ABNORMAL LOW (ref 4.22–5.81)
RDW: 16 % — ABNORMAL HIGH (ref 11.5–15.5)
WBC Count: 67 10*3/uL (ref 4.0–10.5)
nRBC: 0 % (ref 0.0–0.2)

## 2019-09-26 LAB — URIC ACID: Uric Acid, Serum: 7.5 mg/dL (ref 3.7–8.6)

## 2019-09-26 MED ORDER — VENETOCLAX 100 MG PO TABS: 200.0000 mg | ORAL_TABLET | Freq: Every day | ORAL | 2 refills | Status: DC

## 2019-09-26 NOTE — Progress Notes (Unsigned)
09/26/2019 @ 1229pm Received critical lab  WBC 67.0 Verbally communicated with repeat back to Manya Silvas, RN.

## 2019-09-26 NOTE — Telephone Encounter (Signed)
Scheduled per 02/25 los, patient has been called and notified. 

## 2019-09-27 MED FILL — VENCLEXTA 100 MG TABS: 100 | 30 days supply | Qty: 60 | Fill #0

## 2019-09-28 ENCOUNTER — Other Ambulatory Visit: Payer: Self-pay | Admitting: *Deleted

## 2019-09-28 DIAGNOSIS — C9110 Chronic lymphocytic leukemia of B-cell type not having achieved remission: Secondary | ICD-10-CM

## 2019-09-28 NOTE — Progress Notes (Signed)
Marland Kitchen    HEMATOLOGY/ONCOLOGY CLINIC NOTE  Date of Service: 10/03/2019  Patient Care Team: [name], MD as PCP - General (Family Medicine)  CHIEF COMPLAINTS/PURPOSE OF CONSULTATION:  F/u for CLL  HISTORY OF PRESENTING ILLNESS:   Benjamin Watkins is a wonderful 86 y.o. male who has been referred to [place] by his PCP, Dr. Brigitte Pulse from [place] for evaluation and management of elevated WBC.  Of note, Urine test on 11/20/16 show 2+ WBC in urine. Labs on 05/22/17 showed WBC at 18.7. PCP referred him with concern for a smoldering blood cancer. 06/26/17 Labs shows increase of WBC to 21.3.   He presents to the clinic today noting he was first told about elevated WBC in 03/2017. He notes he has felt normal overall, asymptomatic. He has had a few UTIs when he was told about his elevated in WBC. He notes to having a recent eye infection. He is still has residual infection and is continuing treatment.  He notes he has been slowly losing weight purposefully over the last 3 years as he has been watching his weight. He last about 30 pounds. In the past 6 months he lost 2-4 pounds. He lives with his wife and is able to get around well. He notes his age is starting to effect him but he is independent still. He is a currently retired Glass blower/designer.   In the past he was diagnosed with diabetes. He has never been a smoker and does not drink alcohol. He has not been on steroids. He has not received blood transfusion in the past nor has he had tattoos.   On review of symptoms, pt notes to eye infection symptoms. He denies change in breathing, SOB.  He denies abdominal pain, chills, night sweats, no new skin rashes and bums. He has swelling in his left ankle from an ongoing rash which is monitored by Dermatologist. He has occasional arthritis in his arms and knees with back problems at times. He reports to purposeful slow weight loss.    INTERVAL HISTORY:   Benjamin Watkins is here for management and  evaluation of his CLL. The patient's last visit with [place] was on 09/22/2019. The pt reports that he is doing well overall.  The pt reports that he began to notice a rash beginning on Thursday. The rash is currently on his chest, abdomen, back and both forearms. It first began in the trunk and then spread to his arms. His forearms itch but his chest, abdomen, and back does not. He has continued to drink at least 64 oz of water daily. Pt began his second week of 200 mg of Venetoclax on Saturday.   Lab results today (10/03/19) of CBC w/diff and CMP is as follows: all values are WNL except for WBC at 32.2K, RBC at 2.57, Hgb at 8.5, HCT at 26.7, MCV at 103.9, RDW at 16.1, PLT at 93K, Lymhs Abs at 23.6K, Eos Abs at 4.8K, WBC Morphology shows "Large variant lymphs present, some containing nucleoli", BUN at 41, Creatinine at 2.59, Calcium at 8.1, Total Protein at 5.6, ALP at 158, GFR Est Non Af Am at 22. 10/03/2019 Uric acid at 6.9  On review of systems, pt reports rash and denies mouth sores, genital rash, tongue swelling, fevers, SOB, chest pain and any other symptoms.    MEDICAL HISTORY:  Past Medical History:  Diagnosis Date  . Diabetes mellitus without complication ([place])   . Diverticulosis   . History of colon polyps   .  Hypertension   . Low back pain   . Right foot drop     SURGICAL HISTORY: Past Surgical History:  Procedure Laterality Date  . CHOLECYSTECTOMY     laparoscopic  . COLONOSCOPY WITH PROPOFOL N/A 12/10/2015   Procedure: COLONOSCOPY WITH PROPOFOL;  Surgeon: Garlan Fair, MD;  Location: [facility];  Service: Endoscopy;  Laterality: N/A;  . EYE SURGERY Bilateral    bleeding retina  . TONSILLECTOMY      SOCIAL HISTORY: Social History   Socioeconomic History  . Marital status: Married    Spouse name: Not on file  . Number of children: 2  . Years of education: some college  . Highest education level: Not on file  Occupational History  . Occupation: Retired  Tobacco  Use  . Smoking status: Never Smoker  . Smokeless tobacco: Never Used  Substance and Sexual Activity  . Alcohol use: No  . Drug use: No  . Sexual activity: Not on file  Other Topics Concern  . Not on file  Social History Narrative   Lives at home with his wife.   Right-handed.   0.5 cup of coffee each morning, some tea.   Social Determinants of Health   Financial Resource Strain:   . Difficulty of Paying Living Expenses: Not on file  Food Insecurity:   . Worried About Charity fundraiser in the Last Year: Not on file  . Ran Out of Food in the Last Year: Not on file  Transportation Needs:   . Lack of Transportation (Medical): Not on file  . Lack of Transportation (Non-Medical): Not on file  Physical Activity:   . Days of Exercise per Week: Not on file  . Minutes of Exercise per Session: Not on file  Stress:   . Feeling of Stress : Not on file  Social Connections:   . Frequency of Communication with Friends and Family: Not on file  . Frequency of Social Gatherings with Friends and Family: Not on file  . Attends Religious Services: Not on file  . Active Member of Clubs or Organizations: Not on file  . Attends Archivist Meetings: Not on file  . Marital Status: Not on file  Intimate Partner Violence:   . Fear of Current or Ex-Partner: Not on file  . Emotionally Abused: Not on file  . Physically Abused: Not on file  . Sexually Abused: Not on file    FAMILY HISTORY: Family History  Problem Relation Age of Onset  . Stroke Mother   . Diabetes Mother   . Other Father        "old age"    ALLERGIES:  is allergic to hydrochlorothiazide and lisinopril.  MEDICATIONS:  Current Outpatient Medications  Medication Sig Dispense Refill  . acetaminophen (TYLENOL) 325 MG tablet Take 650 mg by mouth as needed for mild pain.     Marland Kitchen aspirin EC 81 MG tablet Take 81 mg by mouth daily.    . carvedilol (COREG) 25 MG tablet Take 25 mg by mouth 2 (two) times daily with a meal.      . chlorthalidone (HYGROTON) 25 MG tablet Take 25 mg by mouth daily.    . Cyanocobalamin (VITAMIN B-12) 1000 MCG SUBL PLACE 1  UNDER THE TONGUE ONCE DAILY 30 tablet 5  . glimepiride (AMARYL) 2 MG tablet Take 2 mg by mouth daily with breakfast.    . hydrALAZINE (APRESOLINE) 25 MG tablet Take 25 mg by mouth 2 (two) times daily.     Marland Kitchen  niacin 500 MG tablet Take 500 mg by mouth at bedtime.    Marland Kitchen NOVOLIN 70/30 RELION (70-30) 100 UNIT/ML injection Inject 70-74 Units into the skin 2 (two) times daily. Takes 74 units in the morning and 70 units at night.    Marland Kitchen omeprazole (PRILOSEC) 20 MG capsule Take 20 mg by mouth daily.    Vladimir Faster Glycol-Propyl Glycol (SYSTANE ULTRA OP) Place 2 drops into both eyes daily as needed (For dry eyes.).    Marland Kitchen venetoclax 100 MG TABS Take 200 mg by mouth daily. 60 tablet 2  . predniSONE (DELTASONE) 20 MG tablet Take 1 tablet (20 mg total) by mouth daily with breakfast. 5 tablet 0   No current facility-administered medications for this visit.    REVIEW OF SYSTEMS:  A 10+ POINT REVIEW OF SYSTEMS WAS OBTAINED including neurology, dermatology, psychiatry, cardiac, respiratory, lymph, extremities, GI, GU, Musculoskeletal, constitutional, breasts, reproductive, HEENT.  All pertinent positives are noted in the HPI.  All others are negative.   PHYSICAL EXAMINATION:  ECOG FS:2 - Symptomatic, <50% confined to bed  .BP (!) 122/44 (BP Location: Left Arm, Patient Position: Sitting) Comment: notified nurse  Pulse 85   Temp 98.7 F (37.1 C) (Oral)   Resp 18   Ht 6\' 2"  (1.88 m)   Wt 242 lb 6.4 oz (110 kg)   SpO2 97%   BMI 31.12 kg/m   Vitals:   10/03/19 0856  BP: (!) 122/44  Pulse: 85  Resp: 18  Temp: 98.7 F (37.1 C)  SpO2: 97%   Wt Readings from Last 3 Encounters:  10/03/19 242 lb 6.4 oz (110 kg)  09/22/19 238 lb 3.2 oz (108 kg)  09/12/19 232 lb 8 oz (105.5 kg)   Body mass index is 31.12 kg/m.    Exam was given in a chair   GENERAL:alert, in no acute distress  and comfortable, flat rash covering chest, abdomen, back, and forearms - no blisters, no vesicles  SKIN: no acute rashes, no significant lesions EYES: conjunctiva are pink and non-injected, sclera anicteric OROPHARYNX: MMM, no exudates, no oropharyngeal erythema or ulceration NECK: supple, no JVD LYMPH:  no palpable lymphadenopathy in the cervical, axillary or inguinal regions LUNGS: clear to auscultation b/l with normal respiratory effort HEART: regular rate & rhythm ABDOMEN:  normoactive bowel sounds , non tender, not distended. No palpable hepatosplenomegaly.  Extremity: 2+ pedal edema left, 1+ pedal edema right  PSYCH: alert & oriented x 3 with fluent speech NEURO: no focal motor/sensory deficits  LABORATORY DATA:  I have reviewed the data as listed  . CBC Latest Ref Rng & Units 10/03/2019 09/29/2019 09/26/2019  WBC 4.0 - 10.5 K/uL 32.3(H) 47.6(H) 67.0(HH)  Hemoglobin 13.0 - 17.0 g/dL 8.5(L) 9.2(L) 9.2(L)  Hematocrit 39.0 - 52.0 % 26.7(L) 28.8(L) 29.1(L)  Platelets 150 - 400 K/uL 93(L) 106(L) 117(L)   . CBC    Component Value Date/Time   WBC 32.3 (H) 10/03/2019 0759   WBC 142.5 (HH) 05/06/2019 1024   RBC 2.57 (L) 10/03/2019 0759   HGB 8.5 (L) 10/03/2019 0759   HCT 26.7 (L) 10/03/2019 0759   PLT 93 (L) 10/03/2019 0759   MCV 103.9 (H) 10/03/2019 0759   MCH 33.1 10/03/2019 0759   MCHC 31.8 10/03/2019 0759   RDW 16.1 (H) 10/03/2019 0759   LYMPHSABS 23.6 (H) 10/03/2019 0759   MONOABS 1.0 10/03/2019 0759   EOSABS 4.8 (H) 10/03/2019 0759   BASOSABS 0.0 10/03/2019 0759    . CMP Latest Ref Rng &  Units 10/03/2019 09/29/2019 09/26/2019  Glucose 70 - 99 mg/dL 76 90 87  BUN 8 - 23 mg/dL 41(H) 29(H) 29(H)  Creatinine 0.61 - 1.24 mg/dL 2.59(H) 2.18(H) 2.14(H)  Sodium 135 - 145 mmol/L 135 137 137  Potassium 3.5 - 5.1 mmol/L 4.3 4.5 3.9  Chloride 98 - 111 mmol/L 103 105 105  CO2 22 - 32 mmol/L 22 26 25   Calcium 8.9 - 10.3 mg/dL 8.1(L) 8.4(L) 8.5(L)  Total Protein 6.5 - 8.1 g/dL 5.6(L)  5.9(L) 5.8(L)  Total Bilirubin 0.3 - 1.2 mg/dL 0.9 0.8 0.6  Alkaline Phos 38 - 126 U/L 148(H) 165(H) 150(H)  AST 15 - 41 U/L 23 17 20   ALT 0 - 44 U/L 24 23 29     . Lab Results  Component Value Date   LDH 256 (H) 08/23/2019   11/24/17 FISH CLL Prognostic:     RADIOGRAPHIC STUDIES: I have personally reviewed the radiological images as listed and agreed with the findings in the report. No results found.   ASSESSMENT & PLAN:   Benjamin Watkins is a 86 y.o. caucasian male with    1. Chronic Lymphocytic Leukemia The above w/u was reviewed and diagnosis of CLL was made based on PBS and flow cytometry results Likely Rai 0 Patient has thrombocytopenia PLT 117k (not <100k to count as staging criteria) No anemia or clinical splenomegaly 11/24/17 FISH CLL Prognostic panel did not reveal a standard mutation.  Lab Results  Component Value Date   LDH 256 (H) 08/23/2019   PLAN:  -Discussed pt labwork today, 10/03/19; WBC continue to improve, PLT borderline low, Hgb dropped slightly, blood chemistries show dehydration, Uric Acid is stable  -Will keep an eye on Hgb, would like to avoid becoming transfusion dependant  -Most likely cause of rash is Allopurinol -Will discontinue Allopurinol immediately  -Will hold Venetoclax for 14 days -Recommend pt continue to drink 64 oz of water per day -Will look for lab improvement in 14 days and improved rash before restarting Venetoclax  -Recommend pt begin OTC 10 mg Claritin PO daily, and 5 mg Pepcid PO daily -Rx Prednisone  -Advised pt to contact if rash worsens  -Will see back in 2 weeks with labs    FOLLOW UP: RTC with Dr [name] with labs in 2 weeks Plz cancel other lab/MD appointments in the next 2 weeks   The total time spent in the appt was 20 minutes and more than 50% was on counseling and direct patient cares.  All of the patient's questions were answered with apparent satisfaction. The patient knows to call the clinic with any  problems, questions or concerns.    Benjamin Lone MD [place] AAHIVMS [facility] Hematology/Oncology Physician [facility]  (Office):       [phone] (Work cell):  [phone] (Fax):           [phone]  10/03/2019 9:26 AM  I, Yevette Edwards, am acting as a scribe for Dr. Sullivan Watkins.   .I have reviewed the above documentation for accuracy and completeness, and I agree with the above. Brunetta Genera MD

## 2019-09-29 ENCOUNTER — Inpatient Hospital Stay: Payer: Medicare Other

## 2019-09-29 ENCOUNTER — Other Ambulatory Visit: Payer: Self-pay

## 2019-09-29 DIAGNOSIS — I10 Essential (primary) hypertension: Secondary | ICD-10-CM | POA: Diagnosis not present

## 2019-09-29 DIAGNOSIS — C9110 Chronic lymphocytic leukemia of B-cell type not having achieved remission: Secondary | ICD-10-CM | POA: Diagnosis not present

## 2019-09-29 DIAGNOSIS — E860 Dehydration: Secondary | ICD-10-CM | POA: Diagnosis not present

## 2019-09-29 DIAGNOSIS — E119 Type 2 diabetes mellitus without complications: Secondary | ICD-10-CM | POA: Diagnosis not present

## 2019-09-29 DIAGNOSIS — R21 Rash and other nonspecific skin eruption: Secondary | ICD-10-CM | POA: Diagnosis not present

## 2019-09-29 DIAGNOSIS — Z794 Long term (current) use of insulin: Secondary | ICD-10-CM | POA: Diagnosis not present

## 2019-09-29 LAB — CBC WITH DIFFERENTIAL (CANCER CENTER ONLY)
Abs Immature Granulocytes: 0 10*3/uL (ref 0.00–0.07)
Basophils Absolute: 0 10*3/uL (ref 0.0–0.1)
Basophils Relative: 0 %
Eosinophils Absolute: 2.9 10*3/uL — ABNORMAL HIGH (ref 0.0–0.5)
Eosinophils Relative: 6 %
HCT: 28.8 % — ABNORMAL LOW (ref 39.0–52.0)
Hemoglobin: 9.2 g/dL — ABNORMAL LOW (ref 13.0–17.0)
Lymphocytes Relative: 84 %
Lymphs Abs: 40 10*3/uL — ABNORMAL HIGH (ref 0.7–4.0)
MCH: 33.5 pg (ref 26.0–34.0)
MCHC: 31.9 g/dL (ref 30.0–36.0)
MCV: 104.7 fL — ABNORMAL HIGH (ref 80.0–100.0)
Monocytes Absolute: 1 10*3/uL (ref 0.1–1.0)
Monocytes Relative: 2 %
Neutro Abs: 3.8 10*3/uL (ref 1.7–7.7)
Neutrophils Relative %: 8 %
Platelet Count: 106 10*3/uL — ABNORMAL LOW (ref 150–400)
RBC: 2.75 MIL/uL — ABNORMAL LOW (ref 4.22–5.81)
RDW: 16.2 % — ABNORMAL HIGH (ref 11.5–15.5)
WBC Count: 47.6 10*3/uL — ABNORMAL HIGH (ref 4.0–10.5)
nRBC: 0 % (ref 0.0–0.2)

## 2019-09-29 LAB — CMP (CANCER CENTER ONLY)
ALT: 23 U/L (ref 0–44)
AST: 17 U/L (ref 15–41)
Albumin: 3.7 g/dL (ref 3.5–5.0)
Alkaline Phosphatase: 165 U/L — ABNORMAL HIGH (ref 38–126)
Anion gap: 6 (ref 5–15)
BUN: 29 mg/dL — ABNORMAL HIGH (ref 8–23)
CO2: 26 mmol/L (ref 22–32)
Calcium: 8.4 mg/dL — ABNORMAL LOW (ref 8.9–10.3)
Chloride: 105 mmol/L (ref 98–111)
Creatinine: 2.18 mg/dL — ABNORMAL HIGH (ref 0.61–1.24)
GFR, Est AFR Am: 31 mL/min — ABNORMAL LOW (ref >60)
GFR, Estimated: 27 mL/min — ABNORMAL LOW (ref >60)
Glucose, Bld: 90 mg/dL (ref 70–99)
Potassium: 4.5 mmol/L (ref 3.5–5.1)
Sodium: 137 mmol/L (ref 135–145)
Total Bilirubin: 0.8 mg/dL (ref 0.3–1.2)
Total Protein: 5.9 g/dL — ABNORMAL LOW (ref 6.5–8.1)

## 2019-09-29 LAB — URIC ACID: Uric Acid, Serum: 7.6 mg/dL (ref 3.7–8.6)

## 2019-09-30 ENCOUNTER — Other Ambulatory Visit: Payer: Self-pay

## 2019-09-30 DIAGNOSIS — C9110 Chronic lymphocytic leukemia of B-cell type not having achieved remission: Secondary | ICD-10-CM

## 2019-10-03 ENCOUNTER — Other Ambulatory Visit: Payer: Self-pay

## 2019-10-03 ENCOUNTER — Inpatient Hospital Stay (HOSPITAL_BASED_OUTPATIENT_CLINIC_OR_DEPARTMENT_OTHER): Payer: Medicare Other | Admitting: Hematology

## 2019-10-03 ENCOUNTER — Telehealth: Payer: Self-pay | Admitting: Hematology

## 2019-10-03 ENCOUNTER — Inpatient Hospital Stay: Payer: Medicare Other

## 2019-10-03 VITALS — BP 122/44 | HR 85 | Temp 98.7°F | Resp 18 | Ht 74.0 in | Wt 242.4 lb

## 2019-10-03 DIAGNOSIS — I10 Essential (primary) hypertension: Secondary | ICD-10-CM | POA: Diagnosis not present

## 2019-10-03 DIAGNOSIS — C9110 Chronic lymphocytic leukemia of B-cell type not having achieved remission: Secondary | ICD-10-CM

## 2019-10-03 DIAGNOSIS — Z794 Long term (current) use of insulin: Secondary | ICD-10-CM | POA: Diagnosis not present

## 2019-10-03 DIAGNOSIS — R21 Rash and other nonspecific skin eruption: Secondary | ICD-10-CM | POA: Diagnosis not present

## 2019-10-03 DIAGNOSIS — D649 Anemia, unspecified: Secondary | ICD-10-CM | POA: Diagnosis not present

## 2019-10-03 DIAGNOSIS — E119 Type 2 diabetes mellitus without complications: Secondary | ICD-10-CM | POA: Diagnosis not present

## 2019-10-03 DIAGNOSIS — E860 Dehydration: Secondary | ICD-10-CM | POA: Diagnosis not present

## 2019-10-03 LAB — CBC WITH DIFFERENTIAL (CANCER CENTER ONLY)
Abs Immature Granulocytes: 0 10*3/uL (ref 0.00–0.07)
Basophils Absolute: 0 10*3/uL (ref 0.0–0.1)
Basophils Relative: 0 %
Eosinophils Absolute: 4.8 10*3/uL — ABNORMAL HIGH (ref 0.0–0.5)
Eosinophils Relative: 15 %
HCT: 26.7 % — ABNORMAL LOW (ref 39.0–52.0)
Hemoglobin: 8.5 g/dL — ABNORMAL LOW (ref 13.0–17.0)
Lymphocytes Relative: 73 %
Lymphs Abs: 23.6 10*3/uL — ABNORMAL HIGH (ref 0.7–4.0)
MCH: 33.1 pg (ref 26.0–34.0)
MCHC: 31.8 g/dL (ref 30.0–36.0)
MCV: 103.9 fL — ABNORMAL HIGH (ref 80.0–100.0)
Monocytes Absolute: 1 10*3/uL (ref 0.1–1.0)
Monocytes Relative: 3 %
Neutro Abs: 2.9 10*3/uL (ref 1.7–7.7)
Neutrophils Relative %: 9 %
Platelet Count: 93 10*3/uL — ABNORMAL LOW (ref 150–400)
RBC: 2.57 MIL/uL — ABNORMAL LOW (ref 4.22–5.81)
RDW: 16.1 % — ABNORMAL HIGH (ref 11.5–15.5)
WBC Count: 32.3 10*3/uL — ABNORMAL HIGH (ref 4.0–10.5)
nRBC: 0 % (ref 0.0–0.2)

## 2019-10-03 LAB — CMP (CANCER CENTER ONLY)
ALT: 24 U/L (ref 0–44)
AST: 23 U/L (ref 15–41)
Albumin: 3.6 g/dL (ref 3.5–5.0)
Alkaline Phosphatase: 148 U/L — ABNORMAL HIGH (ref 38–126)
Anion gap: 10 (ref 5–15)
BUN: 41 mg/dL — ABNORMAL HIGH (ref 8–23)
CO2: 22 mmol/L (ref 22–32)
Calcium: 8.1 mg/dL — ABNORMAL LOW (ref 8.9–10.3)
Chloride: 103 mmol/L (ref 98–111)
Creatinine: 2.59 mg/dL — ABNORMAL HIGH (ref 0.61–1.24)
GFR, Est AFR Am: 25 mL/min — ABNORMAL LOW (ref >60)
GFR, Estimated: 22 mL/min — ABNORMAL LOW (ref >60)
Glucose, Bld: 76 mg/dL (ref 70–99)
Potassium: 4.3 mmol/L (ref 3.5–5.1)
Sodium: 135 mmol/L (ref 135–145)
Total Bilirubin: 0.9 mg/dL (ref 0.3–1.2)
Total Protein: 5.6 g/dL — ABNORMAL LOW (ref 6.5–8.1)

## 2019-10-03 LAB — URIC ACID: Uric Acid, Serum: 6.9 mg/dL (ref 3.7–8.6)

## 2019-10-03 MED ORDER — VITAMIN B-12 1000 MCG SL SUBL: SUBLINGUAL_TABLET | SUBLINGUAL | 5 refills | Status: DC

## 2019-10-03 MED ORDER — PREDNISONE 20 MG PO TABS: 20.0000 mg | ORAL_TABLET | Freq: Every day | ORAL | 0 refills | Status: DC

## 2019-10-03 NOTE — Telephone Encounter (Signed)
Scheduled per los. Gave avs and calendar  

## 2019-10-17 NOTE — Progress Notes (Signed)
Benjamin Watkins    HEMATOLOGY/ONCOLOGY CLINIC NOTE  Date of Service: 10/18/2019  Patient Care Team: [name], MD as PCP - General (Family Medicine)  CHIEF COMPLAINTS/PURPOSE OF CONSULTATION:  F/u for CLL  HISTORY OF PRESENTING ILLNESS:   Benjamin Watkins is a wonderful 86 y.o. male who has been referred to [place] by his PCP, Dr. Brigitte Pulse from [place] for evaluation and management of elevated WBC.  Of note, Urine test on 11/20/16 show 2+ WBC in urine. Labs on 05/22/17 showed WBC at 18.7. PCP referred him with concern for a smoldering blood cancer. 06/26/17 Labs shows increase of WBC to 21.3.   He presents to the clinic today noting he was first told about elevated WBC in 03/2017. He notes he has felt normal overall, asymptomatic. He has had a few UTIs when he was told about his elevated in WBC. He notes to having a recent eye infection. He is still has residual infection and is continuing treatment.  He notes he has been slowly losing weight purposefully over the last 3 years as he has been watching his weight. He last about 30 pounds. In the past 6 months he lost 2-4 pounds. He lives with his wife and is able to get around well. He notes his age is starting to effect him but he is independent still. He is a currently retired Glass blower/designer.   In the past he was diagnosed with diabetes. He has never been a smoker and does not drink alcohol. He has not been on steroids. He has not received blood transfusion in the past nor has he had tattoos.   On review of symptoms, pt notes to eye infection symptoms. He denies change in breathing, SOB.  He denies abdominal pain, chills, night sweats, no new skin rashes and bums. He has swelling in his left ankle from an ongoing rash which is monitored by Dermatologist. He has occasional arthritis in his arms and knees with back problems at times. He reports to purposeful slow weight loss.    INTERVAL HISTORY:   Benjamin Watkins is here for management and  evaluation of his CLL. The patient's last visit with [place] was on 10/03/19. The pt reports that he is doing well overall.  The pt reports he has been very fatigued. He has been able to get up but he report feeling bad when he is up. He has not been eating well and has had no appetite. Pt is eating about half as much as he was previously. He has been falling asleep a lot. Pt has been taking care of himself with help from his wife.   Lab results today (10/18/19) of CBC w/diff and CMP is as follows: all values are WNL except for WBC at 82.5, RBC at 2.46, Hemoglobin at 8.1, HCT at 26.3, MCV at 106.9, RDW at 16.1, Platelets at 79, Neutro As at 9.1K, Lymphs Abs at 69.3K, Monocytes Abs at 3.3K, Eosinophils Abs at 0.8K, BUN at 69, Creatinine at 4.58, Calcium at 8.2, Total Protein at 5.0, Albumin at 3.2, AST at 13, Alkaline Phosphatase at 131, GFR, Est Non Af Am at 11, GFR,  Est AFR Am at 13 10/18/19 of Uric Acid at 10.8  On review of systems, pt reports less appetite, Orthostatic hypotension, diahrrhea and denies abdominal pain, and any other symptoms.    MEDICAL HISTORY:  Past Medical History:  Diagnosis Date  . Diabetes mellitus without complication (Benjamin Watkins)   . Diverticulosis   . History of colon  polyps   . Hypertension   . Low back pain   . Right foot drop     SURGICAL HISTORY: Past Surgical History:  Procedure Laterality Date  . CHOLECYSTECTOMY     laparoscopic  . COLONOSCOPY WITH PROPOFOL N/A 12/10/2015   Procedure: COLONOSCOPY WITH PROPOFOL;  Surgeon: Garlan Fair, MD;  Location: [facility];  Service: Endoscopy;  Laterality: N/A;  . EYE SURGERY Bilateral    bleeding retina  . TONSILLECTOMY      SOCIAL HISTORY: Social History   Socioeconomic History  . Marital status: Married    Spouse name: Not on file  . Number of children: 2  . Years of education: some college  . Highest education level: Not on file  Occupational History  . Occupation: Retired  Tobacco Use  . Smoking  status: Never Smoker  . Smokeless tobacco: Never Used  Substance and Sexual Activity  . Alcohol use: No  . Drug use: No  . Sexual activity: Not on file  Other Topics Concern  . Not on file  Social History Narrative   Lives at home with his wife.   Right-handed.   0.5 cup of coffee each morning, some tea.   Social Determinants of Health   Financial Resource Strain:   . Difficulty of Paying Living Expenses:   Food Insecurity:   . Worried About Charity fundraiser in the Last Year:   . Arboriculturist in the Last Year:   Transportation Needs:   . Film/video editor (Medical):   Benjamin Watkins Lack of Transportation (Non-Medical):   Physical Activity:   . Days of Exercise per Week:   . Minutes of Exercise per Session:   Stress:   . Feeling of Stress :   Social Connections:   . Frequency of Communication with Friends and Family:   . Frequency of Social Gatherings with Friends and Family:   . Attends Religious Services:   . Active Member of Clubs or Organizations:   . Attends Archivist Meetings:   Benjamin Watkins Marital Status:   Intimate Partner Violence:   . Fear of Watkins or Ex-Partner:   . Emotionally Abused:   Benjamin Watkins Physically Abused:   . Sexually Abused:     FAMILY HISTORY: Family History  Problem Relation Age of Onset  . Stroke Mother   . Diabetes Mother   . Other Father        "old age"    ALLERGIES:  is allergic to hydrochlorothiazide and lisinopril.  MEDICATIONS:  Watkins Outpatient Medications  Medication Sig Dispense Refill  . acetaminophen (TYLENOL) 325 MG tablet Take 650 mg by mouth as needed for mild pain.     Benjamin Watkins aspirin EC 81 MG tablet Take 81 mg by mouth daily.    . carvedilol (COREG) 25 MG tablet Take 25 mg by mouth 2 (two) times daily with a meal.    . chlorthalidone (HYGROTON) 25 MG tablet Take 1 tablet (25 mg total) by mouth every other day.    . Cyanocobalamin (VITAMIN B-12) 1000 MCG SUBL PLACE 1  UNDER THE TONGUE ONCE DAILY 30 tablet 5  . glimepiride  (AMARYL) 2 MG tablet Take 2 mg by mouth daily with breakfast.    . niacin 500 MG tablet Take 500 mg by mouth at bedtime.    Benjamin Watkins NOVOLIN 70/30 RELION (70-30) 100 UNIT/ML injection Inject 70-74 Units into the skin 2 (two) times daily. Takes 74 units in the morning and 70 units at night.    Benjamin Watkins  omeprazole (PRILOSEC) 20 MG capsule Take 20 mg by mouth daily.    Vladimir Faster Glycol-Propyl Glycol (SYSTANE ULTRA OP) Place 2 drops into both eyes daily as needed (For dry eyes.).    Benjamin Watkins predniSONE (DELTASONE) 20 MG tablet Take 1 tablet (20 mg total) by mouth daily with breakfast. 5 tablet 0  . venetoclax 100 MG TABS Take 200 mg by mouth daily. 60 tablet 2   No Watkins facility-administered medications for this visit.    REVIEW OF SYSTEMS:  A 10+ POINT REVIEW OF SYSTEMS WAS OBTAINED including neurology, dermatology, psychiatry, cardiac, respiratory, lymph, extremities, GI, GU, Musculoskeletal, constitutional, breasts, reproductive, HEENT.  All pertinent positives are noted in the HPI.  All others are negative.   PHYSICAL EXAMINATION:  ECOG FS:2 - Symptomatic, <50% confined to bed  .BP (!) 94/32 (BP Location: Left Arm, Patient Position: Sitting) Comment: Notified Provider of BP  Pulse 65   Temp 98.2 F (36.8 C) (Temporal)   Resp 18   Ht 6\' 2"  (1.88 m)   Wt 218 lb 4.8 oz (99 kg)   SpO2 100%   BMI 28.03 kg/m   Vitals:   10/18/19 1036  BP: (!) 94/32  Pulse: 65  Resp: 18  Temp: 98.2 F (36.8 C)  SpO2: 100%   Wt Readings from Last 3 Encounters:  10/18/19 218 lb 4.8 oz (99 kg)  10/03/19 242 lb 6.4 oz (110 kg)  09/22/19 238 lb 3.2 oz (108 kg)   Body mass index is 28.03 kg/m.    Exam was given in a wheel chair   GENERAL:alert, in no acute distress and comfortable SKIN: no acute rashes, no significant lesions EYES: conjunctiva are pink and non-injected, sclera anicteric OROPHARYNX: MMM, no exudates, no oropharyngeal erythema or ulceration NECK: supple, no JVD LYMPH:  no palpable  lymphadenopathy in the cervical, axillary or inguinal regions LUNGS: clear to auscultation b/l with normal respiratory effort HEART: regular rate & rhythm ABDOMEN:  normoactive bowel sounds , non tender, not distended. Extremity: no pedal edema PSYCH: alert & oriented x 3 with fluent speech NEURO: no focal motor/sensory deficits  LABORATORY DATA:  I have reviewed the data as listed  . CBC Latest Ref Rng & Units 10/18/2019 10/03/2019 09/29/2019  WBC 4.0 - 10.5 K/uL 82.5(HH) 32.3(H) 47.6(H)  Hemoglobin 13.0 - 17.0 g/dL 8.1(L) 8.5(L) 9.2(L)  Hematocrit 39.0 - 52.0 % 26.3(L) 26.7(L) 28.8(L)  Platelets 150 - 400 K/uL 79(L) 93(L) 106(L)   . CBC    Component Value Date/Time   WBC 82.5 (HH) 10/18/2019 1013   RBC 2.46 (L) 10/18/2019 1013   HGB 8.1 (L) 10/18/2019 1013   HGB 8.5 (L) 10/03/2019 0759   HCT 26.3 (L) 10/18/2019 1013   PLT 79 (L) 10/18/2019 1013   PLT 93 (L) 10/03/2019 0759   MCV 106.9 (H) 10/18/2019 1013   MCH 32.9 10/18/2019 1013   MCHC 30.8 10/18/2019 1013   RDW 16.1 (H) 10/18/2019 1013   LYMPHSABS 69.3 (H) 10/18/2019 1013   MONOABS 3.3 (H) 10/18/2019 1013   EOSABS 0.8 (H) 10/18/2019 1013   BASOSABS 0.0 10/18/2019 1013    . CMP Latest Ref Rng & Units 10/18/2019 10/03/2019 09/29/2019  Glucose 70 - 99 mg/dL 91 76 90  BUN 8 - 23 mg/dL 69(H) 41(H) 29(H)  Creatinine 0.61 - 1.24 mg/dL 4.58(HH) 2.59(H) 2.18(H)  Sodium 135 - 145 mmol/L 142 135 137  Potassium 3.5 - 5.1 mmol/L 3.5 4.3 4.5  Chloride 98 - 111 mmol/L 108 103 105  CO2  22 - 32 mmol/L 23 22 26   Calcium 8.9 - 10.3 mg/dL 8.2(L) 8.1(L) 8.4(L)  Total Protein 6.5 - 8.1 g/dL 5.0(L) 5.6(L) 5.9(L)  Total Bilirubin 0.3 - 1.2 mg/dL 0.8 0.9 0.8  Alkaline Phos 38 - 126 U/L 131(H) 148(H) 165(H)  AST 15 - 41 U/L 13(L) 23 17  ALT 0 - 44 U/L 13 24 23     . Lab Results  Component Value Date   LDH 256 (H) 08/23/2019   11/24/17 FISH CLL Prognostic:     RADIOGRAPHIC STUDIES: I have personally reviewed the radiological images  as listed and agreed with the findings in the report. No results found.   ASSESSMENT & PLAN:   Benjamin Watkins is a 86 y.o. caucasian male with    1. Chronic Lymphocytic Leukemia The above w/u was reviewed and diagnosis of CLL was made based on PBS and flow cytometry results Likely Rai 0 Patient has thrombocytopenia PLT 117k (not <100k to count as staging criteria) No anemia or clinical splenomegaly 11/24/17 FISH CLL Prognostic panel did not reveal a standard mutation.  Lab Results  Component Value Date   LDH 256 (H) 08/23/2019   PLAN:  -Discussed pt labwork today, 10/18/19; of CBC w/diff and CMP is as follows: all values are WNL except for WBC at 82.5, RBC at 2.46, Hemoglobin at 8.1, HCT at 26.3, MCV at 106.9, RDW at 16.1, Platelets at 79, Neutro As at 9.1K, Lymphs Abs at 69.3K, Monocytes Abs at 3.3K, Eosinophils Abs at 0.8K, BUN at 69, Creatinine at 4.58, Calcium at 8.2, Total Protein at 5.0, Albumin at 3.2, AST at 13, Alkaline Phosphatase at 131, GFR, Est Non Af Am at 11, GFR,  Est AFR Am at 13 -Discussed 10/18/19 of Uric Acid at 10.8 -Advised on pt eating better -hold diuretics in the setting of dehydration and bump in creatinine -Advised anemia from several factors, CKD, CLL, Venetoclax -F/u with PCP about blood pressure medication  -Recommend pt continue to drink 48-64 oz of water per day -hold Venbetoclax at this time -will send to ED for symptomatic anemia and Acute on chronic renal insufficiency.   The total time spent in the appt was 30 minutes and more than 50% was on counseling and direct patient cares.  All of the patient's questions were answered with apparent satisfaction. The patient knows to call the clinic with any problems, questions or concerns.    Sullivan Lone MD MS AAHIVMS [facility] Hematology/Oncology Physician [facility]  (Office):       [phone] (Work cell):  [phone] (Fax):           [phone]  10/18/2019 11:19 AM  I,  Dawayne Cirri am acting as a Education administrator for Dr. Sullivan Lone.   .I have reviewed the above documentation for accuracy and completeness, and I agree with the above. Brunetta Genera MD

## 2019-10-18 ENCOUNTER — Other Ambulatory Visit: Payer: Self-pay

## 2019-10-18 ENCOUNTER — Inpatient Hospital Stay: Payer: Medicare Other

## 2019-10-18 ENCOUNTER — Inpatient Hospital Stay (HOSPITAL_BASED_OUTPATIENT_CLINIC_OR_DEPARTMENT_OTHER): Payer: Medicare Other | Admitting: Hematology

## 2019-10-18 ENCOUNTER — Encounter (HOSPITAL_COMMUNITY): Payer: Self-pay | Admitting: Emergency Medicine

## 2019-10-18 ENCOUNTER — Inpatient Hospital Stay (HOSPITAL_COMMUNITY)
Admission: EM | Admit: 2019-10-18 | Discharge: 2019-11-01 | DRG: 673 | Disposition: A | Discharge status: Hospice | Payer: Medicare Other | Attending: Internal Medicine | Admitting: Internal Medicine

## 2019-10-18 VITALS — BP 94/32 | HR 65 | Temp 98.2°F | Resp 18 | Ht 74.0 in | Wt 218.3 lb

## 2019-10-18 DIAGNOSIS — I953 Hypotension of hemodialysis: Secondary | ICD-10-CM | POA: Diagnosis not present

## 2019-10-18 DIAGNOSIS — G9341 Metabolic encephalopathy: Secondary | ICD-10-CM | POA: Diagnosis present

## 2019-10-18 DIAGNOSIS — Z794 Long term (current) use of insulin: Secondary | ICD-10-CM

## 2019-10-18 DIAGNOSIS — D630 Anemia in neoplastic disease: Secondary | ICD-10-CM | POA: Diagnosis present

## 2019-10-18 DIAGNOSIS — R1310 Dysphagia, unspecified: Secondary | ICD-10-CM | POA: Diagnosis not present

## 2019-10-18 DIAGNOSIS — Z6828 Body mass index (BMI) 28.0-28.9, adult: Secondary | ICD-10-CM | POA: Diagnosis not present

## 2019-10-18 DIAGNOSIS — Z7982 Long term (current) use of aspirin: Secondary | ICD-10-CM

## 2019-10-18 DIAGNOSIS — R21 Rash and other nonspecific skin eruption: Secondary | ICD-10-CM | POA: Diagnosis present

## 2019-10-18 DIAGNOSIS — D649 Anemia, unspecified: Secondary | ICD-10-CM

## 2019-10-18 DIAGNOSIS — Z66 Do not resuscitate: Secondary | ICD-10-CM

## 2019-10-18 DIAGNOSIS — C9110 Chronic lymphocytic leukemia of B-cell type not having achieved remission: Secondary | ICD-10-CM

## 2019-10-18 DIAGNOSIS — N1832 Chronic kidney disease, stage 3b: Secondary | ICD-10-CM | POA: Diagnosis not present

## 2019-10-18 DIAGNOSIS — F05 Delirium due to known physiological condition: Secondary | ICD-10-CM | POA: Diagnosis present

## 2019-10-18 DIAGNOSIS — D696 Thrombocytopenia, unspecified: Secondary | ICD-10-CM | POA: Diagnosis present

## 2019-10-18 DIAGNOSIS — H9190 Unspecified hearing loss, unspecified ear: Secondary | ICD-10-CM | POA: Diagnosis present

## 2019-10-18 DIAGNOSIS — N186 End stage renal disease: Secondary | ICD-10-CM

## 2019-10-18 DIAGNOSIS — E872 Acidosis: Secondary | ICD-10-CM | POA: Diagnosis present

## 2019-10-18 DIAGNOSIS — E1122 Type 2 diabetes mellitus with diabetic chronic kidney disease: Secondary | ICD-10-CM | POA: Diagnosis present

## 2019-10-18 DIAGNOSIS — I5181 Takotsubo syndrome: Secondary | ICD-10-CM | POA: Diagnosis present

## 2019-10-18 DIAGNOSIS — E119 Type 2 diabetes mellitus without complications: Secondary | ICD-10-CM

## 2019-10-18 DIAGNOSIS — E11649 Type 2 diabetes mellitus with hypoglycemia without coma: Secondary | ICD-10-CM | POA: Diagnosis not present

## 2019-10-18 DIAGNOSIS — R0902 Hypoxemia: Secondary | ICD-10-CM | POA: Diagnosis not present

## 2019-10-18 DIAGNOSIS — I959 Hypotension, unspecified: Secondary | ICD-10-CM | POA: Diagnosis not present

## 2019-10-18 DIAGNOSIS — I447 Left bundle-branch block, unspecified: Secondary | ICD-10-CM | POA: Diagnosis present

## 2019-10-18 DIAGNOSIS — M2550 Pain in unspecified joint: Secondary | ICD-10-CM | POA: Diagnosis not present

## 2019-10-18 DIAGNOSIS — I5043 Acute on chronic combined systolic (congestive) and diastolic (congestive) heart failure: Secondary | ICD-10-CM | POA: Diagnosis present

## 2019-10-18 DIAGNOSIS — E440 Moderate protein-calorie malnutrition: Secondary | ICD-10-CM | POA: Insufficient documentation

## 2019-10-18 DIAGNOSIS — D631 Anemia in chronic kidney disease: Secondary | ICD-10-CM | POA: Diagnosis not present

## 2019-10-18 DIAGNOSIS — I4891 Unspecified atrial fibrillation: Secondary | ICD-10-CM

## 2019-10-18 DIAGNOSIS — I5042 Chronic combined systolic (congestive) and diastolic (congestive) heart failure: Secondary | ICD-10-CM | POA: Diagnosis not present

## 2019-10-18 DIAGNOSIS — E875 Hyperkalemia: Secondary | ICD-10-CM | POA: Diagnosis not present

## 2019-10-18 DIAGNOSIS — R531 Weakness: Secondary | ICD-10-CM | POA: Diagnosis not present

## 2019-10-18 DIAGNOSIS — T451X5A Adverse effect of antineoplastic and immunosuppressive drugs, initial encounter: Secondary | ICD-10-CM | POA: Diagnosis present

## 2019-10-18 DIAGNOSIS — I517 Cardiomegaly: Secondary | ICD-10-CM | POA: Diagnosis not present

## 2019-10-18 DIAGNOSIS — L8990 Pressure ulcer of unspecified site, unspecified stage: Secondary | ICD-10-CM | POA: Insufficient documentation

## 2019-10-18 DIAGNOSIS — Z515 Encounter for palliative care: Secondary | ICD-10-CM | POA: Diagnosis not present

## 2019-10-18 DIAGNOSIS — F0390 Unspecified dementia without behavioral disturbance: Secondary | ICD-10-CM | POA: Diagnosis present

## 2019-10-18 DIAGNOSIS — E1165 Type 2 diabetes mellitus with hyperglycemia: Secondary | ICD-10-CM | POA: Diagnosis not present

## 2019-10-18 DIAGNOSIS — I10 Essential (primary) hypertension: Secondary | ICD-10-CM | POA: Diagnosis not present

## 2019-10-18 DIAGNOSIS — I132 Hypertensive heart and chronic kidney disease with heart failure and with stage 5 chronic kidney disease, or end stage renal disease: Secondary | ICD-10-CM | POA: Diagnosis present

## 2019-10-18 DIAGNOSIS — E860 Dehydration: Secondary | ICD-10-CM

## 2019-10-18 DIAGNOSIS — Z79899 Other long term (current) drug therapy: Secondary | ICD-10-CM

## 2019-10-18 DIAGNOSIS — R918 Other nonspecific abnormal finding of lung field: Secondary | ICD-10-CM | POA: Diagnosis not present

## 2019-10-18 DIAGNOSIS — Z7401 Bed confinement status: Secondary | ICD-10-CM | POA: Diagnosis not present

## 2019-10-18 DIAGNOSIS — R5383 Other fatigue: Secondary | ICD-10-CM | POA: Diagnosis not present

## 2019-10-18 DIAGNOSIS — I4819 Other persistent atrial fibrillation: Secondary | ICD-10-CM | POA: Diagnosis not present

## 2019-10-18 DIAGNOSIS — I480 Paroxysmal atrial fibrillation: Secondary | ICD-10-CM | POA: Diagnosis not present

## 2019-10-18 DIAGNOSIS — E1129 Type 2 diabetes mellitus with other diabetic kidney complication: Secondary | ICD-10-CM | POA: Diagnosis not present

## 2019-10-18 DIAGNOSIS — N179 Acute kidney failure, unspecified: Secondary | ICD-10-CM | POA: Diagnosis not present

## 2019-10-18 DIAGNOSIS — Z7189 Other specified counseling: Secondary | ICD-10-CM

## 2019-10-18 DIAGNOSIS — J810 Acute pulmonary edema: Secondary | ICD-10-CM | POA: Diagnosis not present

## 2019-10-18 DIAGNOSIS — I340 Nonrheumatic mitral (valve) insufficiency: Secondary | ICD-10-CM | POA: Diagnosis not present

## 2019-10-18 DIAGNOSIS — I272 Pulmonary hypertension, unspecified: Secondary | ICD-10-CM | POA: Diagnosis present

## 2019-10-18 DIAGNOSIS — Z20822 Contact with and (suspected) exposure to covid-19: Secondary | ICD-10-CM | POA: Diagnosis present

## 2019-10-18 DIAGNOSIS — Z4901 Encounter for fitting and adjustment of extracorporeal dialysis catheter: Secondary | ICD-10-CM | POA: Diagnosis not present

## 2019-10-18 DIAGNOSIS — N189 Chronic kidney disease, unspecified: Secondary | ICD-10-CM | POA: Diagnosis not present

## 2019-10-18 DIAGNOSIS — K117 Disturbances of salivary secretion: Secondary | ICD-10-CM | POA: Diagnosis present

## 2019-10-18 DIAGNOSIS — N19 Unspecified kidney failure: Secondary | ICD-10-CM | POA: Diagnosis not present

## 2019-10-18 DIAGNOSIS — N1330 Unspecified hydronephrosis: Secondary | ICD-10-CM | POA: Diagnosis not present

## 2019-10-18 DIAGNOSIS — R630 Anorexia: Secondary | ICD-10-CM | POA: Diagnosis not present

## 2019-10-18 DIAGNOSIS — I129 Hypertensive chronic kidney disease with stage 1 through stage 4 chronic kidney disease, or unspecified chronic kidney disease: Secondary | ICD-10-CM | POA: Diagnosis not present

## 2019-10-18 DIAGNOSIS — N170 Acute kidney failure with tubular necrosis: Secondary | ICD-10-CM | POA: Diagnosis not present

## 2019-10-18 HISTORY — DX: Chronic lymphocytic leukemia of B-cell type not having achieved remission: C91.10

## 2019-10-18 LAB — CBC WITH DIFFERENTIAL/PLATELET
Abs Immature Granulocytes: 0 10*3/uL (ref 0.00–0.07)
Basophils Absolute: 0 10*3/uL (ref 0.0–0.1)
Basophils Relative: 0 %
Eosinophils Absolute: 0.8 10*3/uL — ABNORMAL HIGH (ref 0.0–0.5)
Eosinophils Relative: 1 %
HCT: 26.3 % — ABNORMAL LOW (ref 39.0–52.0)
Hemoglobin: 8.1 g/dL — ABNORMAL LOW (ref 13.0–17.0)
Lymphocytes Relative: 84 %
Lymphs Abs: 69.3 10*3/uL — ABNORMAL HIGH (ref 0.7–4.0)
MCH: 32.9 pg (ref 26.0–34.0)
MCHC: 30.8 g/dL (ref 30.0–36.0)
MCV: 106.9 fL — ABNORMAL HIGH (ref 80.0–100.0)
Monocytes Absolute: 3.3 10*3/uL — ABNORMAL HIGH (ref 0.1–1.0)
Monocytes Relative: 4 %
Neutro Abs: 9.1 10*3/uL — ABNORMAL HIGH (ref 1.7–7.7)
Neutrophils Relative %: 11 %
Platelets: 79 10*3/uL — ABNORMAL LOW (ref 150–400)
RBC: 2.46 MIL/uL — ABNORMAL LOW (ref 4.22–5.81)
RDW: 16.1 % — ABNORMAL HIGH (ref 11.5–15.5)
WBC: 82.5 10*3/uL (ref 4.0–10.5)
nRBC: 0 % (ref 0.0–0.2)

## 2019-10-18 LAB — CMP (CANCER CENTER ONLY)
ALT: 13 U/L (ref 0–44)
AST: 13 U/L — ABNORMAL LOW (ref 15–41)
Albumin: 3.2 g/dL — ABNORMAL LOW (ref 3.5–5.0)
Alkaline Phosphatase: 131 U/L — ABNORMAL HIGH (ref 38–126)
Anion gap: 11 (ref 5–15)
BUN: 69 mg/dL — ABNORMAL HIGH (ref 8–23)
CO2: 23 mmol/L (ref 22–32)
Calcium: 8.2 mg/dL — ABNORMAL LOW (ref 8.9–10.3)
Chloride: 108 mmol/L (ref 98–111)
Creatinine: 4.58 mg/dL (ref 0.61–1.24)
GFR, Est AFR Am: 13 mL/min — ABNORMAL LOW (ref >60)
GFR, Estimated: 11 mL/min — ABNORMAL LOW (ref >60)
Glucose, Bld: 91 mg/dL (ref 70–99)
Potassium: 3.5 mmol/L (ref 3.5–5.1)
Sodium: 142 mmol/L (ref 135–145)
Total Bilirubin: 0.8 mg/dL (ref 0.3–1.2)
Total Protein: 5 g/dL — ABNORMAL LOW (ref 6.5–8.1)

## 2019-10-18 LAB — CREATININE, SERUM
Creatinine, Ser: 4.7 mg/dL — ABNORMAL HIGH (ref 0.61–1.24)
GFR calc Af Amer: 12 mL/min — ABNORMAL LOW (ref >60)
GFR calc non Af Amer: 11 mL/min — ABNORMAL LOW (ref >60)

## 2019-10-18 LAB — SAMPLE TO BLOOD BANK

## 2019-10-18 LAB — GLUCOSE, CAPILLARY
Glucose-Capillary: 69 mg/dL — ABNORMAL LOW (ref 70–99)
Glucose-Capillary: 121 mg/dL — ABNORMAL HIGH (ref 70–99)
Glucose-Capillary: 111 mg/dL — ABNORMAL HIGH (ref 70–99)

## 2019-10-18 LAB — CBC AND DIFFERENTIAL
Hemoglobin: 8.1 — AB (ref 13.0–17.0)
MCH: 32.9
MCV: 106.9 — AB (ref 80–100)
Platelets: 79 — AB (ref 150–400)
RBC: 2.46 — AB (ref 3.87–5.11)
WBC: 82.5 — AB (ref 4.0–10.5)

## 2019-10-18 LAB — PREPARE RBC (CROSSMATCH)

## 2019-10-18 LAB — HEMOGLOBIN AND HEMATOCRIT, BLOOD
HCT: 27.2 % — ABNORMAL LOW (ref 39.0–52.0)
Hemoglobin: 8.4 g/dL — ABNORMAL LOW (ref 13.0–17.0)

## 2019-10-18 LAB — HEMOGLOBIN A1C
Hgb A1c MFr Bld: 7.5 % — ABNORMAL HIGH (ref 4.8–5.6)
Mean Plasma Glucose: 168.55 mg/dL

## 2019-10-18 LAB — SARS CORONAVIRUS 2 (TAT 6-24 HRS): SARS Coronavirus 2: NEGATIVE

## 2019-10-18 LAB — URIC ACID: Uric Acid, Serum: 10.8 mg/dL — ABNORMAL HIGH (ref 3.7–8.6)

## 2019-10-18 LAB — ABO/RH: ABO/RH(D): O POS

## 2019-10-18 LAB — CBG MONITORING, ED: Glucose-Capillary: 65 mg/dL — ABNORMAL LOW (ref 70–99)

## 2019-10-18 MED ORDER — ACETAMINOPHEN 325 MG PO TABS: 650.0000 mg | ORAL_TABLET | Freq: Four times a day (QID) | ORAL | Status: DC | PRN

## 2019-10-18 MED ORDER — ASPIRIN EC 81 MG PO TBEC
81.0000 mg | DELAYED_RELEASE_TABLET | Freq: Every day | ORAL | Status: DC
  Administered 2019-10-19 – 2019-10-31 (×13): 81 mg via ORAL
  Filled 2019-10-18 (×14): qty 1

## 2019-10-18 MED ORDER — ONDANSETRON HCL 4 MG/2ML IJ SOLN: 4.0000 mg | Freq: Four times a day (QID) | INTRAMUSCULAR | Status: DC | PRN

## 2019-10-18 MED ORDER — SODIUM CHLORIDE 0.9 % IV BOLUS: 1000.0000 mL | Freq: Once | INTRAVENOUS | Status: DC

## 2019-10-18 MED ORDER — CARVEDILOL 6.25 MG PO TABS
6.2500 mg | ORAL_TABLET | Freq: Two times a day (BID) | ORAL | Status: DC
  Administered 2019-10-18 – 2019-10-22 (×8): 6.25 mg via ORAL
  Filled 2019-10-18 (×9): qty 1

## 2019-10-18 MED ORDER — ONDANSETRON HCL 4 MG PO TABS: 4.0000 mg | ORAL_TABLET | Freq: Four times a day (QID) | ORAL | Status: DC | PRN

## 2019-10-18 MED ORDER — VENETOCLAX 100 MG PO TABS: 200.0000 mg | ORAL_TABLET | Freq: Every day | ORAL | Status: DC

## 2019-10-18 MED ORDER — PANTOPRAZOLE SODIUM 40 MG PO TBEC
40.0000 mg | DELAYED_RELEASE_TABLET | Freq: Every day | ORAL | Status: DC
  Administered 2019-10-19 – 2019-10-21 (×3): 40 mg via ORAL
  Filled 2019-10-18 (×3): qty 1

## 2019-10-18 MED ORDER — LACTATED RINGERS IV BOLUS
1000.0000 mL | Freq: Once | INTRAVENOUS | Status: AC
  Administered 2019-10-18: 1000 mL via INTRAVENOUS

## 2019-10-18 MED ORDER — INSULIN ASPART 100 UNIT/ML ~~LOC~~ SOLN
0.0000 [IU] | Freq: Three times a day (TID) | SUBCUTANEOUS | Status: DC
  Administered 2019-10-19: 3 [IU] via SUBCUTANEOUS
  Administered 2019-10-19: 2 [IU] via SUBCUTANEOUS
  Administered 2019-10-19: 17:00:00 5 [IU] via SUBCUTANEOUS
  Administered 2019-10-20: 17:00:00 3 [IU] via SUBCUTANEOUS
  Administered 2019-10-20: 5 [IU] via SUBCUTANEOUS
  Administered 2019-10-20: 10:00:00 3 [IU] via SUBCUTANEOUS
  Administered 2019-10-21: 5 [IU] via SUBCUTANEOUS
  Administered 2019-10-21: 3 [IU] via SUBCUTANEOUS
  Administered 2019-10-21 – 2019-10-22 (×2): 5 [IU] via SUBCUTANEOUS
  Administered 2019-10-22: 13:00:00 8 [IU] via SUBCUTANEOUS
  Administered 2019-10-22: 3 [IU] via SUBCUTANEOUS
  Administered 2019-10-23: 13:00:00 8 [IU] via SUBCUTANEOUS
  Administered 2019-10-23 – 2019-10-24 (×3): 5 [IU] via SUBCUTANEOUS
  Administered 2019-10-24 (×2): 3 [IU] via SUBCUTANEOUS
  Administered 2019-10-25: 2 [IU] via SUBCUTANEOUS
  Administered 2019-10-25: 5 [IU] via SUBCUTANEOUS
  Administered 2019-10-26 (×2): 3 [IU] via SUBCUTANEOUS
  Filled 2019-10-18: qty 0.15

## 2019-10-18 MED ORDER — INSULIN ASPART 100 UNIT/ML ~~LOC~~ SOLN
0.0000 [IU] | Freq: Every day | SUBCUTANEOUS | Status: DC
  Administered 2019-10-19 – 2019-10-21 (×2): 2 [IU] via SUBCUTANEOUS
  Filled 2019-10-18: qty 0.05

## 2019-10-18 MED ORDER — SODIUM CHLORIDE 0.9% IV SOLUTION: Freq: Once | INTRAVENOUS | Status: AC

## 2019-10-18 MED ORDER — CHLORTHALIDONE 25 MG PO TABS: 25.0000 mg | ORAL_TABLET | ORAL | Status: DC

## 2019-10-18 MED ORDER — ACETAMINOPHEN 650 MG RE SUPP: 650.0000 mg | Freq: Four times a day (QID) | RECTAL | Status: DC | PRN

## 2019-10-18 MED ORDER — ACETAMINOPHEN 325 MG PO TABS: 650.0000 mg | ORAL_TABLET | ORAL | Status: DC | PRN

## 2019-10-18 MED ORDER — HEPARIN SODIUM (PORCINE) 5000 UNIT/ML IJ SOLN
5000.0000 [IU] | Freq: Three times a day (TID) | INTRAMUSCULAR | Status: DC
  Administered 2019-10-18 – 2019-10-19 (×3): 5000 [IU] via SUBCUTANEOUS
  Filled 2019-10-18 (×3): qty 1

## 2019-10-18 NOTE — ED Provider Notes (Signed)
[facility] DEPT Provider Note   CSN: [id number] Arrival date & time: 10/18/19  1146     History Chief Complaint  Patient presents with  . Abnormal Lab    Benjamin Watkins is a 86 y.o. male.  HPI      Benjamin Watkins is a 86 y.o. male, with a history of DM, HTN, CLL, presenting to the ED with weakness over the past week as well as poor appetite and poor oral intake.  Patient was seen by Dr. [name] today at the cancer center for a routine checkup for his CLL.  He had lab abnormalities that were concerning, including increase in his serum creatinine and anemia requiring blood transfusion.  Patient denies fever/chills, chest pain, shortness of breath, cough, abdominal pain, urinary symptoms, N/V/D, hematochezia/melena, or any other complaints.   Past Medical History:  Diagnosis Date  . CLL (chronic lymphocytic leukemia) ([place])   . Diabetes mellitus without complication ([place])   . Diverticulosis   . History of colon polyps   . Hypertension   . Low back pain   . Right foot drop     Patient Active Problem List   Diagnosis Date Noted  . Gait abnormality 04/05/2018  . Right foot drop 02/05/2018    Past Surgical History:  Procedure Laterality Date  . CHOLECYSTECTOMY     laparoscopic  . COLONOSCOPY WITH PROPOFOL N/A 12/10/2015   Procedure: COLONOSCOPY WITH PROPOFOL;  Surgeon: Benjamin Fair, MD;  Location: [facility];  Service: Endoscopy;  Laterality: N/A;  . EYE SURGERY Bilateral    bleeding retina  . TONSILLECTOMY         Family History  Problem Relation Age of Onset  . Stroke Mother   . Diabetes Mother   . Other Father        "old age"    Social History   Tobacco Use  . Smoking status: Never Smoker  . Smokeless tobacco: Never Used  Substance Use Topics  . Alcohol use: No  . Drug use: No    Home Medications Prior to Admission medications   Medication Sig Start Date End Date Taking? Authorizing Provider    acetaminophen (TYLENOL) 325 MG tablet Take 650 mg by mouth as needed for mild pain.    Yes [provider]  aspirin EC 81 MG tablet Take 81 mg by mouth daily.   Yes [provider]  carvedilol (COREG) 25 MG tablet Take 25 mg by mouth 2 (two) times daily with a meal.   Yes [provider]  chlorthalidone (HYGROTON) 25 MG tablet Take 1 tablet (25 mg total) by mouth every other day. 10/18/19  Yes Brunetta Genera, MD  Cyanocobalamin (VITAMIN B-12) 1000 MCG SUBL PLACE 1  UNDER THE TONGUE ONCE DAILY Patient taking differently: Take 1,000 mcg by mouth daily.  10/03/19  Yes Brunetta Genera, MD  glimepiride (AMARYL) 2 MG tablet Take 2 mg by mouth daily with breakfast.   Yes [provider]  niacin 500 MG tablet Take 500 mg by mouth at bedtime.   Yes [provider]  NOVOLIN 70/30 RELION (70-30) 100 UNIT/ML injection Inject 70-74 Units into the skin 2 (two) times daily. Takes 74 units in the morning and 70 units at night. 08/27/15  Yes [provider]  omeprazole (PRILOSEC) 20 MG capsule Take 20 mg by mouth daily. 09/05/15  Yes [provider]  Polyethyl Glycol-Propyl Glycol (SYSTANE ULTRA OP) Place 2 drops into both eyes daily as  needed (For dry eyes.).   Yes [provider]  predniSONE (DELTASONE) 20 MG tablet Take 1 tablet (20 mg total) by mouth daily with breakfast. Patient not taking: Reported on 10/18/2019 10/03/19   Brunetta Genera, MD  venetoclax 100 MG TABS Take 200 mg by mouth daily. 09/26/19   Brunetta Genera, MD    Allergies    Hydrochlorothiazide and Lisinopril  Review of Systems   Review of Systems  Constitutional: Positive for appetite change and fatigue. Negative for chills and fever.  Respiratory: Negative for cough and shortness of breath.   Cardiovascular: Negative for chest pain.  Gastrointestinal: Negative for abdominal pain, diarrhea, nausea and vomiting.  Neurological: Positive for weakness.  Negative for syncope.  All other systems reviewed and are negative.   Physical Exam Updated Vital Signs BP 105/60 (BP Location: Left Arm)   Pulse 66   Temp 97.7 F (36.5 C) (Oral)   Resp 18   Ht 6\' 2"  (1.88 m)   Wt 98.9 kg   SpO2 100%   BMI 27.99 kg/m   Physical Exam Vitals and nursing note reviewed.  Constitutional:      General: He is not in acute distress.    Appearance: He is well-developed. He is not diaphoretic.  HENT:     Head: Normocephalic and atraumatic.     Mouth/Throat:     Mouth: Mucous membranes are moist.     Pharynx: Oropharynx is clear.  Eyes:     Conjunctiva/sclera: Conjunctivae normal.  Cardiovascular:     Rate and Rhythm: Normal rate and regular rhythm.     Pulses: Normal pulses.          Radial pulses are 2+ on the right side and 2+ on the left side.       Posterior tibial pulses are 2+ on the right side and 2+ on the left side.     Heart sounds: Normal heart sounds.     Comments: Tactile temperature in the extremities appropriate and equal bilaterally. Pulmonary:     Effort: Pulmonary effort is normal. No respiratory distress.     Breath sounds: Normal breath sounds.  Abdominal:     Palpations: Abdomen is soft.     Tenderness: There is no abdominal tenderness. There is no guarding.  Musculoskeletal:     Cervical back: Neck supple.     Right lower leg: Pitting Edema present.     Left lower leg: Pitting Edema present.     Comments: Patient and his wife state lower extremity edema is chronic for him.  Lymphadenopathy:     Cervical: No cervical adenopathy.  Skin:    General: Skin is warm and dry.  Neurological:     Mental Status: He is alert.  Psychiatric:        Mood and Affect: Mood and affect normal.        Speech: Speech normal.        Behavior: Behavior normal.     ED Results / Procedures / Treatments   Labs (all labs ordered are listed, but only abnormal results are displayed) Labs Reviewed  CBG MONITORING, ED - Abnormal; Notable  for the following components:      Result Value   Glucose-Capillary 65 (*)    All other components within normal limits  SARS CORONAVIRUS 2 (TAT 6-24 HRS)  TYPE AND SCREEN  PREPARE RBC (CROSSMATCH)      BUN  Date Value Ref Range Status  10/18/2019 69 (H) 8 - 23  mg/dL Final  10/03/2019 41 (H) 8 - 23 mg/dL Final  09/29/2019 29 (H) 8 - 23 mg/dL Final  09/26/2019 29 (H) 8 - 23 mg/dL Final   Creatinine  Date Value Ref Range Status  10/18/2019 4.58 (HH) 0.61 - 1.24 mg/dL Final    Comment:    CRITICAL RESULT CALLED TO, READ BACK BY AND VERIFIED WITH: Nicole Kindred, RN AT 1111 BY P.SUTCAVAGE   10/03/2019 2.59 (H) 0.61 - 1.24 mg/dL Final  09/29/2019 2.18 (H) 0.61 - 1.24 mg/dL Final  09/26/2019 2.14 (H) 0.61 - 1.24 mg/dL Final   Hemoglobin  Date Value Ref Range Status  10/18/2019 8.1 (L) 13.0 - 17.0 g/dL Final  10/18/2019 8.1 (A) 13.0 - 17.0 Final  10/03/2019 8.5 (L) 13.0 - 17.0 g/dL Final  09/29/2019 9.2 (L) 13.0 - 17.0 g/dL Final  09/26/2019 9.2 (L) 13.0 - 17.0 g/dL Final    EKG None  Radiology No results found.  Procedures .Critical Care Performed by: Lorayne Bender, PA-C Authorized by: Lorayne Bender, PA-C   Critical care provider statement:    Critical care time (minutes):  35   Critical care time was exclusive of:  Separately billable procedures and treating other patients   Critical care was necessary to treat or prevent imminent or life-threatening deterioration of the following conditions: Symptomatic anemia requiring transfusion.   Critical care was time spent personally by me on the following activities:  Development of treatment plan with patient or surrogate, ordering and performing treatments and interventions, ordering and review of laboratory studies, ordering and review of radiographic studies, discussions with consultants, evaluation of patient's response to treatment, examination of patient, obtaining history from patient or surrogate, re-evaluation of  patient's condition and review of old charts   I assumed direction of critical care for this patient from another provider in my specialty: no     (including critical care time)  Medications Ordered in ED Medications  0.9 %  sodium chloride infusion (Manually program via Guardrails IV Fluids) (has no administration in time range)  lactated ringers bolus 1,000 mL (1,000 mLs Intravenous New Bag/Given 10/18/19 1308)    ED Course  I have reviewed the triage vital signs and the nursing notes.  Pertinent labs & imaging results that were available during my care of the patient were reviewed by me and considered in my medical decision making (see chart for details).  Clinical Course as of Oct 17 1400  Tue Oct 18, 2019  1254 Spoke with Dr. [name], patient's oncologist. States patient has had increased weakness at least over the last week.  Poor oral intake and has stopped eating and drinking. Patient has elevation in his serum creatinine.  He does have elevation in uric acid, however, he does not think this is due to primary tumor lysis, rather simply from his creatinine elevation. He had blood pressures of 80/30 at cancer center today.  He also states patient has chronic lower extremity edema and even this is diminished. His hemoglobin today was 8.1, however, due to the patient's elevation in his creatinine, Dr. [name] suspects he is actually hemoconcentrated and his hemoglobin is actually at least a unit lower.  Requesting transfusion of 1 unit PRBCs.   [SJ]  37 Spoke with Dr. Wynelle Cleveland, hospitalist. Agrees to admit the patient.   [SJ]    Clinical Course User Index [SJ] Enis Leatherwood, Helane Gunther, PA-C   MDM Rules/Calculators/A&P  Patient presents to the ED from the cancer center due to concerns for dehydration. He has increased creatinine, which may be causing artificially elevated value of hemoglobin due to hemoconcentration. Ordered IV fluids and RBC transfusion. I personally reviewed  and interpreted patient's lab work. Patient admitted for further management.  Findings and plan of care discussed with Quintella Reichert, MD. Dr. Ralene Bathe personally evaluated and examined this patient.  Vitals:   10/18/19 1153 10/18/19 1245 10/18/19 1300  BP: 105/60 (!) 113/44 (!) 120/43  Pulse: 66 73 70  Resp: 18 (!) 24 17  Temp: 97.7 F (36.5 C)    TempSrc: Oral    SpO2: 100% 100% 100%  Weight: 98.9 kg    Height: 6\' 2"  (1.88 m)       Final Clinical Impression(s) / ED Diagnoses Final diagnoses:  Symptomatic anemia  AKI (acute kidney injury) ([place])    Rx / DC Orders ED Discharge Orders         Ordered    CBC and differential    Comments: This order was created through External Result Entry    10/18/19 1312    CBC and differential    Comments: This order was created through External Result Entry    10/18/19 1317    CBC and differential  Status:  Canceled    Comments: This order was created through External Result Entry    10/18/19 1317           Layla Maw 10/18/19 1405    Quintella Reichert, MD 10/19/19 1055

## 2019-10-18 NOTE — ED Notes (Signed)
Blood bank has blood ready for this patient,notified,Chel[place],RN.

## 2019-10-18 NOTE — ED Notes (Signed)
Dr Wynelle Cleveland at bedside speaking with patient.

## 2019-10-18 NOTE — Progress Notes (Signed)
CRITICAL VALUE STICKER  CRITICAL VALUE: WBC 82.5  RECEIVER (on-site recipient of call): Jobe Igo, [place] NOTIFIED: 10/18/2019 @ 1036  MESSENGER (representative from lab): Rosann Auerbach  MD NOTIFIED: Dr. Sullivan Lone  TIME OF NOTIFICATION: 1036  RESPONSE: MD to address

## 2019-10-18 NOTE — H&P (Addendum)
History and Physical    Benjamin Watkins  MGQ:[id number]  DOB: December 29, 1932  DOA: 10/18/2019 PCP: [name], MD   Patient coming from: home  Chief Complaint: abnormal labs  HPI: Benjamin Watkins is a 86 y.o. male with medical history of CLL, HTN, DM2 who presents from the oncology office for abnormal labs. His BUN is 69 and Cr is 4.58. Dr [name] also feels he needs blood and the EDP has ordered this as well.  The patient tells me he as told to drink more water but due to the fact that he sleeps all day, he cannot drink as much water as needed. He states he has not been eating much either but is not sure if he has lost weight. He feels weak and is occassionally light headed when he walks. No nausea, vomiting or diarrhea. No other complaints.    ED Course:  BUN 69- Cr 4.58, WBC 82.5, Hb 8.1, plt 79  Glucose 65  Review of Systems:  He states his sugars have been running in the high 200s for a couple of weeks.  All other systems reviewed and apart from HPI, are negative.  Past Medical History:  Diagnosis Date  . CLL (chronic lymphocytic leukemia) ([place])   . Diabetes mellitus without complication ([place])   . Diverticulosis   . History of colon polyps   . Hypertension   . Low back pain   . Right foot drop     Past Surgical History:  Procedure Laterality Date  . CHOLECYSTECTOMY     laparoscopic  . COLONOSCOPY WITH PROPOFOL N/A 12/10/2015   Procedure: COLONOSCOPY WITH PROPOFOL;  Surgeon: Garlan Fair, MD;  Location: [facility];  Service: Endoscopy;  Laterality: N/A;  . EYE SURGERY Bilateral    bleeding retina  . TONSILLECTOMY      Social History:   reports that he has never smoked. He has never used smokeless tobacco. He reports that he does not drink alcohol or use drugs.  Allergies  Allergen Reactions  . Hydrochlorothiazide Other (See Comments)    "It just bothers me"  . Lisinopril Other (See Comments)    "Takes away energy"    Family History  Problem  Relation Age of Onset  . Stroke Mother   . Diabetes Mother   . Other Father        "old age"     Prior to Admission medications   Medication Sig Start Date End Date Taking? Authorizing Provider  acetaminophen (TYLENOL) 325 MG tablet Take 650 mg by mouth as needed for mild pain.    Yes [provider]  aspirin EC 81 MG tablet Take 81 mg by mouth daily.   Yes [provider]  carvedilol (COREG) 25 MG tablet Take 25 mg by mouth 2 (two) times daily with a meal.   Yes [provider]  chlorthalidone (HYGROTON) 25 MG tablet Take 1 tablet (25 mg total) by mouth every other day. 10/18/19  Yes Brunetta Genera, MD  Cyanocobalamin (VITAMIN B-12) 1000 MCG SUBL PLACE 1  UNDER THE TONGUE ONCE DAILY Patient taking differently: Take 1,000 mcg by mouth daily.  10/03/19  Yes Brunetta Genera, MD  glimepiride (AMARYL) 2 MG tablet Take 2 mg by mouth daily with breakfast.   Yes [provider]  niacin 500 MG tablet Take 500 mg by mouth at bedtime.   Yes [provider]  NOVOLIN 70/30 RELION (70-30) 100 UNIT/ML injection Inject 70-74 Units into the skin 2 (two)  times daily. Takes 74 units in the morning and 70 units at night. 08/27/15  Yes [provider]  omeprazole (PRILOSEC) 20 MG capsule Take 20 mg by mouth daily. 09/05/15  Yes [provider]  Polyethyl Glycol-Propyl Glycol (SYSTANE ULTRA OP) Place 2 drops into both eyes daily as needed (For dry eyes.).   Yes [provider]  predniSONE (DELTASONE) 20 MG tablet Take 1 tablet (20 mg total) by mouth daily with breakfast. Patient not taking: Reported on 10/18/2019 10/03/19   Brunetta Genera, MD  venetoclax 100 MG TABS Take 200 mg by mouth daily. 09/26/19   Brunetta Genera, MD    Physical Exam: Wt Readings from Last 3 Encounters:  10/18/19 98.9 kg  10/18/19 99 kg  10/03/19 110 kg   Vitals:   10/18/19 1300 10/18/19 1415 10/18/19 1430 10/18/19 1445  BP: (!) 120/43 (!) 123/46  (!) 131/48 (!) 127/44  Pulse: 70 74 74 77  Resp: 17 18 (!) 23 (!) 22  Temp:      TempSrc:      SpO2: 100% 99% 100% 100%  Weight:      Height:          Constitutional:  Calm & comfortable Eyes: PERRLA, lids and conjunctivae normal ENT: very hard of hearing Mucous membranes are very dry Pharynx clear of exudate   Normal dentition.  Neck: Supple, no masses  Respiratory:  Clear to auscultation bilaterally  Normal respiratory effort.  Cardiovascular:  S1 & S2 heard, regular rate and rhythm No Murmurs Abdomen:  Non distended No tenderness, No masses Bowel sounds normal Extremities:  No clubbing / cyanosis 2+ pedal edema- more in left leg than right No joint deformity    Skin:  No rashes, lesions or ulcers Neurologic:  AAO x 3 CN 2-12 grossly intact Sensation intact Strength 5/5 in all 4 extremities Psychiatric:  Normal Mood and affect    Labs on Admission: I have personally reviewed following labs and imaging studies  CBC: Recent Labs  Lab 10/18/19 1013  WBC 82.5*  82.5*  NEUTROABS 9.1*  HGB 8.1*  8.1*  HCT 26.3*  MCV 106.9*  106.9*  PLT 79*  79*   Basic Metabolic Panel: Recent Labs  Lab 10/18/19 1013  NA 142  K 3.5  CL 108  CO2 23  GLUCOSE 91  BUN 69*  CREATININE 4.58*  CALCIUM 8.2*   GFR: Estimated Creatinine Clearance: 15.1 mL/min (A) (by C-G formula based on SCr of 4.58 mg/dL [facility])). Liver Function Tests: Recent Labs  Lab 10/18/19 1013  AST 13*  ALT 13  ALKPHOS 131*  BILITOT 0.8  PROT 5.0*  ALBUMIN 3.2*   No results for input(s): LIPASE, AMYLASE in the last 168 hours. No results for input(s): AMMONIA in the last 168 hours. Coagulation Profile: No results for input(s): INR, PROTIME in the last 168 hours. Cardiac Enzymes: No results for input(s): CKTOTAL, CKMB, CKMBINDEX, TROPONINI in the last 168 hours. BNP (last 3 results) No results for input(s): PROBNP in the last 8760 hours. HbA1C: No results for input(s): HGBA1C in the  last 72 hours. CBG: Recent Labs  Lab 10/18/19 1352  GLUCAP 65*   Lipid Profile: No results for input(s): CHOL, HDL, LDLCALC, TRIG, CHOLHDL, LDLDIRECT in the last 72 hours. Thyroid Function Tests: No results for input(s): TSH, T4TOTAL, FREET4, T3FREE, THYROIDAB in the last 72 hours. Anemia Panel: No results for input(s): VITAMINB12, FOLATE, FERRITIN, TIBC, IRON, RETICCTPCT in the last 72 hours. Urine analysis: No results  found for: COLORURINE, APPEARANCEUR, LABSPEC, PHURINE, GLUCOSEU, HGBUR, BILIRUBINUR, KETONESUR, PROTEINUR, UROBILINOGEN, NITRITE, LEUKOCYTESUR Sepsis Labs: @LABRCNTIP (procalcitonin:4,lacticidven:4) )No results found for this or any previous visit (from the past 240 hour(s)).   Radiological Exams on Admission: No results found.   Assessment/Plan Principal Problem:   AKI (acute kidney injury)/  Dehydration - due to chlorthalidone and poor oral intake - hold Chlorthalidone and start IVF  Active Problems:    CLL (chronic lymphocytic leukemia) - WBC now 82.5- it has been in 30-60 range in the past - Dr [name] is aware of his admission -  Venclexta has been on hold- will cont to hold    Anemia - Hb 8.1- per the ED doctor, Dr [name] feels the patient is hemo concentrated and his actually Hb is much lower- he has recommended 1 U PRBC be transfusioned    DM (diabetes mellitus), type 2  - per patient, evening sugars have been in 2-300- he only takes his 70/30 insulin if his sugars is higher than 200 - this AM his in the 60s- will cont SSI for now and follow his sugars before deciding to resume 70/30 - hold Amaryl   H/o HTN - will give Coreg at recuded dose as BP is not very elevated at this time   DVT prophylaxis: Heparin Code Status: Full code  Family Communication: wife  Disposition Plan: from home  Consults called: none  Admission status: inpatient    Debbe Odea MD [facility] Pager: [email or web address] Password [email or web address] 7PM-7AM, please contact  night-coverage   10/18/2019, 3:18 PM

## 2019-10-18 NOTE — ED Notes (Signed)
ED Provider at bedside. 

## 2019-10-18 NOTE — Progress Notes (Signed)
CRITICAL VALUE STICKER  CRITICAL VALUE: Creatinine 4.58  RECEIVER (on-site recipient of call): Jobe Igo, [place] NOTIFIED: 10/18/2019 @ 1120  MESSENGER (representative from lab): Pam  MD NOTIFIED: Dr.Kale Gautam  TIME OF NOTIFICATION: 1120  RESPONSE: MD to address

## 2019-10-18 NOTE — ED Notes (Signed)
Per cancer center-states patient needs a unit of blood and fluids

## 2019-10-18 NOTE — Progress Notes (Signed)
Small skin tear noted to left buttock measuring 2cm x 1.5cm Foam dressing applied

## 2019-10-18 NOTE — Patient Instructions (Signed)
Discontinue Hydralazine Hold Venetoclax for now in the context of anemia and lower platelet counts Change Chlorthalidone to every other day due to poor po intake and some element of dehydration Plz try to improve po food and fluid intake F/u with PCP in 1-2 weeks

## 2019-10-18 NOTE — ED Triage Notes (Signed)
Transported from [facility] for abnormal labs. Per [place] RN, patient had labs drawn today with a creatine of 4.58, WBC of 82.5 and hb of 8.1. Patient c/o weakness.

## 2019-10-19 ENCOUNTER — Telehealth: Payer: Self-pay | Admitting: Hematology

## 2019-10-19 LAB — BPAM RBC
Blood Product Expiration Date: 202104202359
ISSUE DATE / TIME: 202103231602
Unit Type and Rh: 5100

## 2019-10-19 LAB — BASIC METABOLIC PANEL
Anion gap: 11 (ref 5–15)
BUN: 70 mg/dL — ABNORMAL HIGH (ref 8–23)
CO2: 21 mmol/L — ABNORMAL LOW (ref 22–32)
Calcium: 8 mg/dL — ABNORMAL LOW (ref 8.9–10.3)
Chloride: 108 mmol/L (ref 98–111)
Creatinine, Ser: 4.75 mg/dL — ABNORMAL HIGH (ref 0.61–1.24)
GFR calc Af Amer: 12 mL/min — ABNORMAL LOW (ref >60)
GFR calc non Af Amer: 10 mL/min — ABNORMAL LOW (ref >60)
Glucose, Bld: 141 mg/dL — ABNORMAL HIGH (ref 70–99)
Potassium: 3.6 mmol/L (ref 3.5–5.1)
Sodium: 140 mmol/L (ref 135–145)

## 2019-10-19 LAB — TYPE AND SCREEN
ABO/RH(D): O POS
Antibody Screen: NEGATIVE
Unit division: 0

## 2019-10-19 LAB — GLUCOSE, CAPILLARY
Glucose-Capillary: 137 mg/dL — ABNORMAL HIGH (ref 70–99)
Glucose-Capillary: 153 mg/dL — ABNORMAL HIGH (ref 70–99)
Glucose-Capillary: 235 mg/dL — ABNORMAL HIGH (ref 70–99)
Glucose-Capillary: 190 mg/dL — ABNORMAL HIGH (ref 70–99)

## 2019-10-19 LAB — CBC
HCT: 26.5 % — ABNORMAL LOW (ref 39.0–52.0)
Hemoglobin: 8.4 g/dL — ABNORMAL LOW (ref 13.0–17.0)
MCH: 32.2 pg (ref 26.0–34.0)
MCHC: 31.7 g/dL (ref 30.0–36.0)
MCV: 101.5 fL — ABNORMAL HIGH (ref 80.0–100.0)
Platelets: 69 10*3/uL — ABNORMAL LOW (ref 150–400)
RBC: 2.61 MIL/uL — ABNORMAL LOW (ref 4.22–5.81)
RDW: 20.2 % — ABNORMAL HIGH (ref 11.5–15.5)
WBC: 74.7 10*3/uL (ref 4.0–10.5)
nRBC: 0 % (ref 0.0–0.2)

## 2019-10-19 LAB — PROTIME-INR
INR: 1.5 — ABNORMAL HIGH (ref 0.8–1.2)
Prothrombin Time: 18.1 seconds — ABNORMAL HIGH (ref 11.4–15.2)

## 2019-10-19 MED ORDER — ENSURE ENLIVE PO LIQD
237.0000 mL | Freq: Two times a day (BID) | ORAL | Status: DC
  Administered 2019-10-20 – 2019-10-25 (×6): 237 mL via ORAL

## 2019-10-19 MED ORDER — PRO-STAT SUGAR FREE PO LIQD
30.0000 mL | Freq: Two times a day (BID) | ORAL | Status: DC
  Administered 2019-10-20 – 2019-10-26 (×13): 30 mL via ORAL
  Filled 2019-10-19 (×12): qty 30

## 2019-10-19 MED ORDER — SODIUM CHLORIDE 0.9 % IV SOLN: INTRAVENOUS | Status: DC

## 2019-10-19 NOTE — Progress Notes (Signed)
Initial Nutrition Assessment  DOCUMENTATION CODES:   Not applicable  INTERVENTION:  Ensure Enlive po BID, each supplement provides 350 kcal and 20 grams of protein  Prostat 30 ml po BID, each supplement provides 100 kcal and 15 grams of protein   NUTRITION DIAGNOSIS:   Inadequate oral intake related to cancer and cancer related treatments as evidenced by per patient/family report, percent weight loss(decreased po intake due to feeling weak and sleeping most of the day).  GOAL:   Patient will meet greater than or equal to 90% of their needs    MONITOR:   PO intake, Weight trends, Skin, I & O's, Labs, Supplement acceptance  REASON FOR ASSESSMENT:   Malnutrition Screening Tool    ASSESSMENT:   RD working remotely.  86 year old male with past medical history of chronic lymphocytic leukemia, HTN, DM2, and diverticulosis presented from outpatient oncology office for evaluation of abnormal labs (BUN 69, Cr 4.58, WBC 82.5, Hgb 8.1, plt 79) Patient reports feeling weak, decreased po intake and unable to drink as much water as needed due to sleeping all day, occasionally feeling light headed when he walks and has had elevated sugars in the high 200s over the past couple of weeks.  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  Current wt 217.58 lbs Per history, on 05/06/19 pt wt 257.18 lbs, on 08/23/19 pt wt 233.42 lbs, on 2/25 pt wt 237.6 lbs, on 10/03/19 pt wt 242 lbs. This indicates a 20 lb (8.4%) wt loss in 1 month, and a 40 lb (15.4%) wt loss over the past 5.5 months which is significant.  Patient is on a regular diet, no documented meals for review at this time. Will continue to monitor and provide Ensure and Prostat  supplements to aid with estimated needs.  Given patient's advanced age, significant downtrend in weights, and history of CCL highly suspect malnutrition, however unable to identify without NFPE and/or dietary recall.  Unable to obtain nutrition history via phone due to patient noted to be hard of hearing. Will plan on completing NFPE at follow-up.  I/Os: +187 ml since admit UOP: 400 ml since admit Medications reviewed and include: SSI, Protonix IVF: NaCl @ 75 ml/hr  Labs: CBGs 235,153,137,111 x 24 hrs, BUN 70 (H), Cr 4.75 (H), WBC 74.7 (H), Hgb 8.4 (L)  NUTRITION - FOCUSED PHYSICAL EXAM: Unable to complete at this time, RD working remotely.  Diet Order:   Diet Order            Diet regular Room service appropriate? Yes; Fluid consistency: Thin  Diet effective now              EDUCATION NEEDS:   Not appropriate for education at this time  Skin:  Skin Assessment: Reviewed RN Assessment  Last BM:  3/24 type 5  Height:   Ht Readings from Last 1 Encounters:  10/18/19 6\' 2"  (1.88 m)    Weight:   Wt Readings from Last 1 Encounters:  10/18/19 98.9 kg    Ideal Body Weight:  86.4 kg  BMI:  Body mass index is 27.99 kg/m.  Estimated Nutritional Needs:   Kcal:  3474-2595  Protein:  115-130  Fluid:  >/= 2.3 L/day   Vinnie Level  Truddie Crumble, RD, LDN Clinical Nutrition After Hours/Weekend Pager # in [place]

## 2019-10-19 NOTE — Telephone Encounter (Signed)
Scheduled per los, called patient and left a voicemail.

## 2019-10-19 NOTE — Evaluation (Signed)
Physical Therapy Evaluation Patient Details Name: Benjamin Watkins MRN: [id number] DOB: January 19, 1933 Today's Date: 10/19/2019   History of Present Illness  86 y.o. male with medical history of CLL, HTN, DM2 who presents from the oncology office for abnormal labs.  Pt admitted for acute kidney injury and dehydration  Clinical Impression  Pt admitted with above diagnosis.  Pt currently with functional limitations due to the deficits listed below (see PT Problem List). Pt will benefit from skilled PT to increase their independence and safety with mobility to allow discharge to the venue listed below.  Pt eager to mobilize.  Pt assisted with ambulating in hallway and utilized RW for more support today (pt typically uses SPC).  Pt and spouse feel pt will be able to return home upon d/c.  Recommended RW and HHPT upon d/c.     Follow Up Recommendations Home health PT;Supervision for mobility/OOB    Equipment Recommendations  Rolling walker with 5" wheels    Recommendations for Other Services       Precautions / Restrictions Precautions Precautions: Fall Precaution Comments: hx R foot drop Restrictions Weight Bearing Restrictions: No      Mobility  Bed Mobility Overal bed mobility: Needs Assistance Bed Mobility: Supine to Sit     Supine to sit: Min guard;HOB elevated     General bed mobility comments: provided a hand for pt to self assist trunk upright, increased time and effort  Transfers Overall transfer level: Needs assistance Equipment used: Rolling walker (2 wheeled) Transfers: Sit to/from Stand Sit to Stand: Min guard         General transfer comment: close guard for safety, cues for hand placement and technique  Ambulation/Gait Ambulation/Gait assistance: Min guard Gait Distance (Feet): 80 Feet Assistive device: Rolling walker (2 wheeled) Gait Pattern/deviations: Step-to pattern;Decreased dorsiflexion - right     General Gait Details: verbal cues for RW  positioning and posture, increased R hip/knee flexion to clear R foot due to hx of foot drop and AFO at home  Stairs            Wheelchair Mobility    Modified Rankin (Stroke Patients Only)       Balance Overall balance assessment: Needs assistance         Standing balance support: Bilateral upper extremity supported Standing balance-Leahy Scale: Poor Standing balance comment: reliant on UE support                             Pertinent Vitals/Pain Pain Assessment: No/denies pain Pain Intervention(s): Repositioned;Monitored during session    Home Living Family/patient expects to be discharged to:: Private residence Living Arrangements: Spouse/significant other   Type of Home: House Home Access: Stairs to enter   Technical brewer of Steps: 3 Home Layout: Multi-level Home Equipment: Cane - single point      Prior Function Level of Independence: Independent with assistive device(s)         Comments: uses SPC occasionally     Hand Dominance        Extremity/Trunk Assessment        Lower Extremity Assessment Lower Extremity Assessment: LLE deficits/detail;RLE deficits/detail;Generalized weakness RLE Deficits / Details: hx R foot drop, has AFO at home LLE Deficits / Details: L lower leg with red discoloration and edematous however pt and spouse report this is chronic    Cervical / Trunk Assessment Cervical / Trunk Assessment: Kyphotic  Communication   Communication: HOH  Cognition  Arousal/Alertness: Awake/alert Behavior During Therapy: WFL for tasks assessed/performed Overall Cognitive Status: Within Functional Limits for tasks assessed                                        General Comments      Exercises     Assessment/Plan    PT Assessment Patient needs continued PT services  PT Problem List Decreased strength;Decreased mobility;Decreased balance;Decreased knowledge of use of DME;Decreased activity  tolerance       PT Treatment Interventions DME instruction;Therapeutic exercise;Gait training;Balance training;Functional mobility training;Therapeutic activities;Patient/family education;Stair training    PT Goals (Current goals can be found in the Care Plan section)  Acute Rehab PT Goals PT Goal Formulation: With patient/family Time For Goal Achievement: 10/26/19 Potential to Achieve Goals: Good    Frequency Min 3X/week   Barriers to discharge        Co-evaluation               AM-PAC PT "6 Clicks" Mobility  Outcome Measure Help needed turning from your back to your side while in a flat bed without using bedrails?: A Little Help needed moving from lying on your back to sitting on the side of a flat bed without using bedrails?: A Little Help needed moving to and from a bed to a chair (including a wheelchair)?: A Little Help needed standing up from a chair using your arms (e.g., wheelchair or bedside chair)?: A Little Help needed to walk in hospital room?: A Little Help needed climbing 3-5 steps with a railing? : A Lot 6 Click Score: 17    End of Session Equipment Utilized During Treatment: Gait belt Activity Tolerance: Patient tolerated treatment well Patient left: in chair;with call bell/phone within reach;with chair alarm set   PT Visit Diagnosis: Difficulty in walking, not elsewhere classified (R26.2);Muscle weakness (generalized) (M62.81)    Time: 6701-4103 PT Time Calculation (min) (ACUTE ONLY): 23 min   Charges:   PT Evaluation $PT Eval Low Complexity: 1 Low     Kati PT, DPT Acute Rehabilitation Services Office: [phone]  York Ram E 10/19/2019, 12:05 PM

## 2019-10-19 NOTE — Progress Notes (Signed)
PROGRESS NOTE  Benjamin Watkins  DOB: [birth year]  PCP: [name], MD KGU:[id number]  DOA: 10/18/2019 Admitted From: Home  LOS: 1 day   Chief Complaint  Patient presents with  . Abnormal Lab   Brief narrative: Benjamin Watkins is a 86 y.o. male with PMH of CLL, HTN, DM2. Patient was sent to ED from oncology office for abnormal labs including creatinine of 4.58.  Patient follows up with Dr. [name] for CLL. He was seen on 3/8, underwent blood work hemoglobin was 8.5, creatinine was 2.59. One of the CLL medications by the name of venetoclax was held.  He was started on oral prednisone for treatment of rash.   Patient was asked to drink plenty water but apparently he was not able to drink enough water because he remained sleepy most of the hours in the day.  Repeat blood work was obtained on 3/23. Hemoglobin was noted to be low at 8.1, creatinine elevated to 4.58.  Patient was admitted to hospitalist service for further evaluation management  Subjective: Patient was seen and examined this morning.  Pleasant elderly Caucasian male.  Propped up in bed.  Hard of hearing.  Not in physical distress.  Wife and son at bedside.  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 -Fluid rate adjusted this morning.  Continue to monitor creatinine with morning lab. -Keep chlorthalidone on hold  CLL (chronic lymphocytic leukemia) - WBC now 82.5- it has been in 30-60 range in the past - Dr [name] is aware of his admission - Venclexta has been on hold- will cont to hold  Anemia - Hb 8.1-per the ED doctor, Dr [name] feels the patient is hemo concentrated and his actually Hb is much lower-1 unit PRBC was transfused. -Repeat hemoglobin this morning is at 8.4.  Continue to monitor hemoglobin.  DM (diabetes mellitus), type 2  -Home meds include glimepiride, ReliOn 70/30 insulin, 70 units twice a  day -Insulin requirement is currently low because of poor creatinine clearance. -Currently on sliding scale insulin only.  Keep home meds on hold.   HTN -Home meds include Coreg 25 mg twice daily and chlorthalidone.  -Continue Coreg at low-dose of 6.25 mg twice daily.  Keep chlorthalidone on hold. -Continue to monitor blood pressure and heart rate.  DVT prophylaxis: Heparin Code Status: Full code  Family Communication: wife  Disposition Plan: from home  Consults called: none  Admission status: inpatient   Code Status:  Full code DVT prophylaxis: SCD boots.  Avoid heparin because of low platelets Antimicrobials:  None Fluid: Normal saline at 75 mils per hour Diet: Diabetic diet Mobility: PT eval Family Communication:  Spoke with wife and son at bedside Discharge plan:  Anticipated date and disposition: Anticipate 2-3 more days of stay Barriers: Elevated creatinine  Consultants:  None  Antimicrobials: Anti-infectives (From admission, onward)   None        Code Status: Full Code   Diet Order            Diet regular Room service appropriate? Yes; Fluid consistency: Thin  Diet effective now              Infusions:  . sodium chloride 75 mL/hr at 10/19/19 0950    Scheduled Meds: . sodium chloride   Intravenous Once  . aspirin EC  81 mg Oral Daily  . carvedilol  6.25 mg Oral BID WC  . heparin  5,000 Units Subcutaneous Q8H  . insulin aspart  0-15 Units Subcutaneous TID WC  . insulin aspart  0-5 Units Subcutaneous QHS  . pantoprazole  40 mg Oral Daily    PRN meds: acetaminophen **OR** acetaminophen, ondansetron **OR** ondansetron (ZOFRAN) IV   Objective: Vitals:   10/18/19 1940 10/19/19 0534  BP: (!) 127/47 (!) 129/45  Pulse: 73 66  Resp: 17 18  Temp: 98.6 F (37 C) 98.7 F (37.1 C)  SpO2: 100% 99%    Intake/Output Summary (Last 24 hours) at 10/19/2019 1335 Last data filed at 10/19/2019 0500 Gross per 24 hour  Intake 587.92 ml  Output 400 ml  Net  187.92 ml   Filed Weights   10/18/19 1153  Weight: 98.9 kg   Weight change:  Body mass index is 27.99 kg/m.   Physical Exam: General exam: Appears calm and comfortable.  Skin: No rashes, lesions or ulcers. HEENT: Atraumatic, normocephalic, supple neck, no obvious bleeding Lungs: Clear to auscultation bilaterally CVS: Regular rate and rhythm, no murmur GI/Abd soft, nontender, nondistended, bowel sound present CNS: Alert, awake, oriented x3 Psychiatry: Mood appropriate Extremities: Pedal edema trace bilaterally but also has a skin puckering probably recent dehydration.  Data Review: I have personally reviewed the laboratory data and studies available.  Recent Labs  Lab 10/18/19 1013 10/18/19 2038 10/19/19 0542  WBC 82.5*  82.5*  --  74.7*  NEUTROABS 9.1*  --   --   HGB 8.1*  8.1* 8.4* 8.4*  HCT 26.3* 27.2* 26.5*  MCV 106.9*  106.9*  --  101.5*  PLT 79*  79*  --  69*   Recent Labs  Lab 10/18/19 1013 10/18/19 1313 10/19/19 0542  NA 142  --  140  K 3.5  --  3.6  CL 108  --  108  CO2 23  --  21*  GLUCOSE 91  --  141*  BUN 69*  --  70*  CREATININE 4.58* 4.70* 4.75*  CALCIUM 8.2*  --  8.0*    Signed, Terrilee Croak, MD [facility] Pager: [phone] (Secure Chat preferred). 10/19/2019

## 2019-10-19 NOTE — TOC Initial Note (Signed)
Transition of Care [facility]) - Initial/Assessment Note    Patient Details  Name: Benjamin Watkins MRN: [id number] Date of Birth: 1933-03-28  [facility] CM/SW Contact:    Bethann Berkshire, [place] Phone Number: 10/19/2019, 3:37 PM  Clinical Narrative:             CSW met with pt and his wife in response to PT recommendation for [facility] PT. Patient lives at home with his wife and is eager to return home. Patient and wife are confident that pt. Will be able to return home with little challenge. CSW addresses recommendation for  [facility] PT services. Patient and wife are highly ambivalent about [facility] services. Patient and wife explain that they feel confident pt will be able to regain strength at home with assistance from their son. TOC will inquire at later time whether or not pt still does not want HH.    Expected Discharge Plan: Home/Self Care Barriers to Discharge: No Barriers Identified   Patient Goals and CMS Choice        Expected Discharge Plan and Services Expected Discharge Plan: Home/Self Care                                              Prior Living Arrangements/Services   Lives with:: Spouse Patient language and need for interpreter reviewed:: Yes Do you feel safe going back to the place where you live?: Yes      Need for Family Participation in Patient Care: No (Comment) Care giver support system in place?: Yes (comment)   Criminal Activity/Legal Involvement Pertinent to Current Situation/Hospitalization: No - Comment as needed  Activities of Daily Living Home Assistive Devices/Equipment: Cane (specify quad or straight), CBG Meter, Eyeglasses(single point cane) ADL Screening (condition at time of admission) Patient's cognitive ability adequate to safely complete daily activities?: Yes Is the patient deaf or have difficulty hearing?: Yes(hoh) Does the patient have difficulty seeing, even when wearing glasses/contacts?: No Does the patient have difficulty  concentrating, remembering, or making decisions?: No Patient able to express need for assistance with ADLs?: Yes Does the patient have difficulty dressing or bathing?: Yes Independently performs ADLs?: No Communication: Independent Dressing (OT): Independent Grooming: Independent Feeding: Independent Bathing: Independent Toileting: Needs assistance Is this a change from baseline?: Change from baseline, expected to last >3days In/Out Bed: Needs assistance Is this a change from baseline?: Change from baseline, expected to last >3 days Walks in Home: Needs assistance Is this a change from baseline?: Change from baseline, expected to last >3 days Does the patient have difficulty walking or climbing stairs?: Yes(secondary to weakness) Weakness of Legs: Both Weakness of Arms/Hands: None  Permission Sought/Granted   Permission granted to share information with : Yes, Verbal Permission Granted  Share Information with NAME: Queenan,[name] Wife           Emotional Assessment Appearance:: Appears stated age Attitude/Demeanor/Rapport: Engaged Affect (typically observed): Stable Orientation: : Oriented to Self, Oriented to Place, Oriented to  Time, Oriented to Situation Alcohol / Substance Use: Not Applicable Psych Involvement: No (comment)  Admission diagnosis:  AKI (acute kidney injury) ([place]) [N17.9] Symptomatic anemia [D64.9] Patient Active Problem List   Diagnosis Date Noted  . AKI (acute kidney injury) ([place]) 10/18/2019  . Dehydration 10/18/2019  . CLL (chronic lymphocytic leukemia) ([place]) 10/18/2019  . Anemia 10/18/2019  . DM (diabetes mellitus), type 2 (  [place]) 10/18/2019  . Gait abnormality 04/05/2018  . Right foot drop 02/05/2018   PCP:  [name], MD Pharmacy:   [place], [place] - [address] [place] [address] Phone: [phone] Fax: [place], [place] - [address] [place] [address] Phone: [phone] Fax: [phone]     Social Determinants of Health (SDOH) Interventions    Readmission Risk Interventions No flowsheet data found.

## 2019-10-19 NOTE — Progress Notes (Signed)
CRITICAL VALUE ALERT  Critical Value:   WBC 74.7  Date & Time Notied:  10/19/19   3817  Provider Notified: Baltazar Najjar, NP  Orders Received/Actions taken: See new orders

## 2019-10-20 ENCOUNTER — Inpatient Hospital Stay (HOSPITAL_COMMUNITY): Payer: Medicare Other

## 2019-10-20 LAB — CBC WITH DIFFERENTIAL/PLATELET
Abs Immature Granulocytes: 0.36 10*3/uL — ABNORMAL HIGH (ref 0.00–0.07)
Basophils Absolute: 0.3 10*3/uL — ABNORMAL HIGH (ref 0.0–0.1)
Basophils Relative: 0 %
Eosinophils Absolute: 0.5 10*3/uL (ref 0.0–0.5)
Eosinophils Relative: 1 %
HCT: 27.6 % — ABNORMAL LOW (ref 39.0–52.0)
Hemoglobin: 8.5 g/dL — ABNORMAL LOW (ref 13.0–17.0)
Immature Granulocytes: 1 %
Lymphocytes Relative: 74 %
Lymphs Abs: 53.9 10*3/uL — ABNORMAL HIGH (ref 0.7–4.0)
MCH: 32.3 pg (ref 26.0–34.0)
MCHC: 30.8 g/dL (ref 30.0–36.0)
MCV: 104.9 fL — ABNORMAL HIGH (ref 80.0–100.0)
Monocytes Absolute: 11.9 10*3/uL — ABNORMAL HIGH (ref 0.1–1.0)
Monocytes Relative: 16 %
Neutro Abs: 5.3 10*3/uL (ref 1.7–7.7)
Neutrophils Relative %: 8 %
Platelets: 74 10*3/uL — ABNORMAL LOW (ref 150–400)
RBC: 2.63 MIL/uL — ABNORMAL LOW (ref 4.22–5.81)
RDW: 20.1 % — ABNORMAL HIGH (ref 11.5–15.5)
WBC Morphology: ABNORMAL
WBC: 72.7 10*3/uL (ref 4.0–10.5)
nRBC: 0 % (ref 0.0–0.2)

## 2019-10-20 LAB — BASIC METABOLIC PANEL
Anion gap: 13 (ref 5–15)
BUN: 77 mg/dL — ABNORMAL HIGH (ref 8–23)
CO2: 18 mmol/L — ABNORMAL LOW (ref 22–32)
Calcium: 7.8 mg/dL — ABNORMAL LOW (ref 8.9–10.3)
Chloride: 110 mmol/L (ref 98–111)
Creatinine, Ser: 5.36 mg/dL — ABNORMAL HIGH (ref 0.61–1.24)
GFR calc Af Amer: 10 mL/min — ABNORMAL LOW (ref >60)
GFR calc non Af Amer: 9 mL/min — ABNORMAL LOW (ref >60)
Glucose, Bld: 217 mg/dL — ABNORMAL HIGH (ref 70–99)
Potassium: 4.2 mmol/L (ref 3.5–5.1)
Sodium: 141 mmol/L (ref 135–145)

## 2019-10-20 LAB — GLUCOSE, CAPILLARY
Glucose-Capillary: 184 mg/dL — ABNORMAL HIGH (ref 70–99)
Glucose-Capillary: 225 mg/dL — ABNORMAL HIGH (ref 70–99)
Glucose-Capillary: 152 mg/dL — ABNORMAL HIGH (ref 70–99)
Glucose-Capillary: 172 mg/dL — ABNORMAL HIGH (ref 70–99)

## 2019-10-20 LAB — CK: Total CK: 42 U/L — ABNORMAL LOW (ref 49–397)

## 2019-10-20 LAB — PHOSPHORUS
Phosphorus: 4.5 mg/dL (ref 2.5–4.6)
Phosphorus: 4.5 mg/dL (ref 2.5–4.6)

## 2019-10-20 LAB — URIC ACID: Uric Acid, Serum: 10.4 mg/dL — ABNORMAL HIGH (ref 3.7–8.6)

## 2019-10-20 LAB — MAGNESIUM: Magnesium: 2 mg/dL (ref 1.7–2.4)

## 2019-10-20 MED ORDER — INSULIN GLARGINE 100 UNIT/ML ~~LOC~~ SOLN
5.0000 [IU] | Freq: Every day | SUBCUTANEOUS | Status: DC
  Administered 2019-10-20 – 2019-10-21 (×2): 5 [IU] via SUBCUTANEOUS
  Filled 2019-10-20 (×2): qty 0.05

## 2019-10-20 MED ORDER — MIRTAZAPINE 15 MG PO TABS
15.0000 mg | ORAL_TABLET | Freq: Every evening | ORAL | Status: DC | PRN
  Filled 2019-10-20: qty 1

## 2019-10-20 NOTE — Consult Note (Signed)
Reason for Consult: AKI/CKD stage 3-4 Referring Physician: Pietro Cassis, MD  Benjamin Watkins is an 86 y.o. male with a PMH significant for CLL (managed by Dr. [name]), DM, HTN, anemia of chronic disease, and CKD stage 3-4 who was sent to [facility] ED after Dr. [name] noticed abnormal labs on 10/18/19.  His WBC was up to 82.5, Hgb of 8.4, platelets of 69, and his BUN/Cr increased to 69/4.58.  His Scr had been climbing since 10/03/19 and was instructed to increase his fluid intake, however his BUN/Cr continued to climb.  He also noted a rash on his back and trunk which started the first week in March.  He has been receiving escalating doses of Venetoclax and was on his 4th week of Venetoclax 200 mg when he was told to stop the medication and go to the ED. He also reported diarrhea prior to admission along with nausea but no vomiting.  He had been on HCTZ but this was stopped with his rising Scr.  We were consulted to further evaluate his AKI/CKD stage 3-4.  He is followed by Dr. Brigitte Pulse and he was aware of his CKD but has not seen Nephrology before.   He denies any NSAIDs/COX-II I's, dysuria, pyuria, hematuria, urgency, frequency, or retention.   Trend in Creatinine: Creatinine  Date/Time Value Ref Range Status  10/20/2019 08:58 AM 5.36 (H) 0.61 - 1.24 mg/dL Final  10/19/2019 05:42 AM 4.75 (H) 0.61 - 1.24 mg/dL Final  10/18/2019 01:13 PM 4.70 (H) 0.61 - 1.24 mg/dL Final  10/18/2019 10:13 AM 4.58 (HH) 0.61 - 1.24 mg/dL Final  10/03/2019 07:59 AM 2.59 (H) 0.61 - 1.24 mg/dL Final  09/29/2019 12:30 PM 2.18 (H) 0.61 - 1.24 mg/dL Final  09/26/2019 12:15 PM 2.14 (H) 0.61 - 1.24 mg/dL Final  09/22/2019 11:52 AM 2.16 (H) 0.61 - 1.24 mg/dL Final  09/19/2019 12:45 PM 1.95 (H) 0.61 - 1.24 mg/dL Final  09/14/2019 01:11 PM 2.01 (H) 0.61 - 1.24 mg/dL Final  09/12/2019 01:14 PM 1.91 (H) 0.61 - 1.24 mg/dL Final  09/08/2019 01:24 PM 2.07 (H) 0.61 - 1.24 mg/dL Final  09/05/2019 08:55 AM 1.92 (H) 0.61 - 1.24 mg/dL Final  08/31/2019  08:44 AM 2.15 (H) 0.61 - 1.24 mg/dL Final  08/23/2019 01:10 PM 2.00 (H) 0.61 - 1.24 mg/dL Final  05/06/2019 10:24 AM 1.92 (H) 0.61 - 1.24 mg/dL Final  02/04/2019 09:27 AM 1.97 (H) 0.61 - 1.24 mg/dL Final  11/26/2018 10:45 AM 1.79 (H) 0.61 - 1.24 mg/dL Final  07/30/2018 10:57 AM 2.10 (H) 0.61 - 1.24 mg/dL Final  03/25/2018 12:50 PM 1.53 (H) 0.61 - 1.24 mg/dL Final  11/24/2017 12:56 PM 1.52 (H) 0.70 - 1.30 mg/dL Final    PMH:   Past Medical History:  Diagnosis Date  . CLL (chronic lymphocytic leukemia) ([place])   . Diabetes mellitus without complication ([place])   . Diverticulosis   . History of colon polyps   . Hypertension   . Low back pain   . Right foot drop     PSH:   Past Surgical History:  Procedure Laterality Date  . CHOLECYSTECTOMY     laparoscopic  . COLONOSCOPY WITH PROPOFOL N/A 12/10/2015   Procedure: COLONOSCOPY WITH PROPOFOL;  Surgeon: Garlan Fair, MD;  Location: [facility] ENDOSCOPY;  Service: Endoscopy;  Laterality: N/A;  . EYE SURGERY Bilateral    bleeding retina  . TONSILLECTOMY      Allergies:  Allergies  Allergen Reactions  . Hydrochlorothiazide Other (See Comments)    "It just  bothers me"  . Lisinopril Other (See Comments)    "Takes away energy"    Medications:   Prior to Admission medications   Medication Sig Start Date End Date Taking? Authorizing Provider  acetaminophen (TYLENOL) 325 MG tablet Take 650 mg by mouth as needed for mild pain.    Yes [provider]  aspirin EC 81 MG tablet Take 81 mg by mouth daily.   Yes [provider]  carvedilol (COREG) 25 MG tablet Take 25 mg by mouth 2 (two) times daily with a meal.   Yes [provider]  chlorthalidone (HYGROTON) 25 MG tablet Take 1 tablet (25 mg total) by mouth every other day. 10/18/19  Yes Brunetta Genera, MD  Cyanocobalamin (VITAMIN B-12) 1000 MCG SUBL PLACE 1  UNDER THE TONGUE ONCE DAILY Patient taking differently: Take 1,000 mcg by mouth daily.  10/03/19  Yes Brunetta Genera, MD  glimepiride (AMARYL) 2 MG tablet Take 2 mg by mouth daily with breakfast.   Yes [provider]  niacin 500 MG tablet Take 500 mg by mouth at bedtime.   Yes [provider]  NOVOLIN 70/30 RELION (70-30) 100 UNIT/ML injection Inject 70-74 Units into the skin 2 (two) times daily. Takes 74 units in the morning and 70 units at night. 08/27/15  Yes [provider]  omeprazole (PRILOSEC) 20 MG capsule Take 20 mg by mouth daily. 09/05/15  Yes [provider]  Polyethyl Glycol-Propyl Glycol (SYSTANE ULTRA OP) Place 2 drops into both eyes daily as needed (For dry eyes.).   Yes [provider]  venetoclax 100 MG TABS Take 200 mg by mouth daily. 09/26/19   Brunetta Genera, MD    Inpatient medications: . sodium chloride   Intravenous Once  . aspirin EC  81 mg Oral Daily  . carvedilol  6.25 mg Oral BID WC  . feeding supplement (ENSURE ENLIVE)  237 mL Oral BID BM  . feeding supplement (PRO-STAT SUGAR FREE 64)  30 mL Oral BID  . insulin aspart  0-15 Units Subcutaneous TID WC  . insulin aspart  0-5 Units Subcutaneous QHS  . insulin glargine  5 Units Subcutaneous Daily  . pantoprazole  40 mg Oral Daily    Discontinued Meds:   Medications Discontinued During This Encounter  Medication Reason  . sodium chloride 0.9 % bolus 1,000 mL   . predniSONE (DELTASONE) 20 MG tablet Error  . acetaminophen (TYLENOL) tablet 500 mg Duplicate  . venetoclax TABS 200 mg   . heparin injection 5,000 Units     Social History:  reports that he has never smoked. He has never used smokeless tobacco. He reports that he does not drink alcohol or use drugs.  Family History:   Family History  Problem Relation Age of Onset  . Stroke Mother   . Diabetes Mother   . Other Father        "old age"    Pertinent items are noted in HPI. Weight change:   Intake/Output Summary (Last 24 hours) at 10/20/2019 1607 Last data filed at 10/20/2019 0310 Gross per 24 hour   Intake 472 ml  Output 400 ml  Net 72 ml   BP (!) 116/50 (BP Location: Left Arm)   Pulse 79   Temp 98.8 F (37.1 C) (Oral)   Resp 18   Ht 6\' 2"  (1.88 m)   Wt 98.9 kg   SpO2 99%   BMI 27.99 kg/m  Vitals:   10/19/19 1309 10/19/19 2204  10/20/19 0607 10/20/19 1353  BP: (!) 133/52 (!) 116/46 (!) 115/49 (!) 116/50  Pulse: 79 81 85 79  Resp: 18 18 18 18   Temp: 98.7 F (37.1 C) 98.6 F (37 C) 98.4 F (36.9 C) 98.8 F (37.1 C)  TempSrc: Oral Oral Oral Oral  SpO2: 99% 96% 96% 99%  Weight:      Height:         General appearance: fatigued, no distress, mildly obese, pale and slowed mentation Head: Normocephalic, without obvious abnormality, atraumatic Eyes: negative findings: lids and lashes normal, conjunctivae and sclerae normal and corneas clear Resp: clear to auscultation bilaterally Cardio: no rub GI: soft, non-tender; bowel sounds normal; no masses,  no organomegaly Extremities: edema trace pretibial and presacral edema  Labs: Basic Metabolic Panel: Recent Labs  Lab 10/18/19 1013 10/18/19 1313 10/19/19 0542 10/20/19 0858 10/20/19 1348  NA 142  --  140 141  --   K 3.5  --  3.6 4.2  --   CL 108  --  108 110  --   CO2 23  --  21* 18*  --   GLUCOSE 91  --  141* 217*  --   BUN 69*  --  70* 77*  --   CREATININE 4.58* 4.70* 4.75* 5.36*  --   ALBUMIN 3.2*  --   --   --   --   CALCIUM 8.2*  --  8.0* 7.8*  --   PHOS  --   --   --  4.5 4.5   Liver Function Tests: Recent Labs  Lab 10/18/19 1013  AST 13*  ALT 13  ALKPHOS 131*  BILITOT 0.8  PROT 5.0*  ALBUMIN 3.2*   No results for input(s): LIPASE, AMYLASE in the last 168 hours. No results for input(s): AMMONIA in the last 168 hours. CBC: Recent Labs  Lab 10/18/19 1013 10/18/19 2038 10/19/19 0542 10/20/19 0858  WBC 82.5*  82.5*  --  74.7* 72.7*  NEUTROABS 9.1*  --   --  5.3  HGB 8.1*  8.1* 8.4* 8.4* 8.5*  HCT 26.3* 27.2* 26.5* 27.6*  MCV 106.9*  106.9*  --  101.5* 104.9*  PLT 79*  79*  --  69* 74*    PT/INR: @LABRCNTIP (inr:5) Cardiac Enzymes: ) Recent Labs  Lab 10/20/19 1348  CKTOTAL 42*   CBG: Recent Labs  Lab 10/19/19 1211 10/19/19 1605 10/19/19 2206 10/20/19 0744 10/20/19 1209  GLUCAP 153* 235* 190* 184* 225*    Iron Studies: No results for input(s): IRON, TIBC, TRANSFERRIN, FERRITIN in the last 168 hours.  Xrays/Other Studies: US RENAL  Result Date: 10/20/2019 CLINICAL DATA:  Acute renal failure. EXAM: RENAL / URINARY TRACT ULTRASOUND COMPLETE COMPARISON:  None. FINDINGS: Right Kidney: Renal measurements: 12.5 x 5.8 x 7.1 cm = volume: 272 mL . Echogenicity within normal limits. No mass or hydronephrosis visualized. 1.3 cm cyst over the upper pole. Left Kidney: Renal measurements: 0.5 x 5.9 x 6.5 cm = volume: 253 mL. Echogenicity within normal limits. No mass or hydronephrosis visualized. Bladder: Appears normal for degree of bladder distention. Bilateral ureteral jets visualized. Other: Mild prominence of the prostate gland measuring 5.8 x 5.0 x 3.6 cm = volume 54.8 cubic cm. IMPRESSION: 1.  Normal size kidneys without hydronephrosis. 2.  1.3 cm right renal cyst. 3.  Mild prostatic enlargement. Electronically Signed   By: Marin Olp M.D.   On: 10/20/2019 15:37     Assessment/Plan: 1.  AKI/CKD stage 3-4: possible ischemic ATN in setting  of poor po intake and diarrhea but more concerning for drug reaction/AIN related to escalating doses of venetoclax.  Elevated uric acid and normal phos so not likely tumor lysis syndrome and CK normal.  1. Renal US w/o obstruction 2. Awaiting urine studies 3. venetoclax has been associated with tumor lysis syndrome and his uric acid is elevated but phos 4.5 (unknown baseline), and calcium has dropped to 7.8.  But time course doesn't fit as he has been on the higher dose for 4 weeks. 4. Will order urine eosinophils to evaluate for acute interstitial nephritis. 5. Continue with IVF's for now and holding venetoclax. 6. No indication for  dialysis at this time and will continue to follow.  2. Acute metabolic encephalopathy- was confused this morning but better this afternoon. 3. CLL- venetoclax on hold.  Per Heme/Onc 4. DM- per primary svc 5. HTN- stable to low.  6. Anemia- due to venetoclax  7. Thrombocytopenia- presumably due to venetoclax cont to hold and follow.   Governor Rooks Artis Buechele 10/20/2019, 4:07 PM

## 2019-10-20 NOTE — Progress Notes (Addendum)
PROGRESS NOTE  Benjamin Watkins  DOB: 02/20/1933  PCP: Benjamin Neer, MD I[facility]:[id number]  DOA: 10/18/2019 Admitted From: Home  LOS: 2 days   Chief Complaint  Patient presents with  . Abnormal Lab   Brief narrative: Benjamin Watkins is a 86 y.o. male with PMH of CLL, HTN, DM2. Patient was sent to ED from oncology office for abnormal labs including creatinine of 4.58.  Patient follows up with Benjamin Watkins for CLL. He was seen on 3/8, underwent blood work hemoglobin was 8.5, creatinine was 2.59. One of the CLL medications by the name of venetoclax was held.  He was started on oral prednisone for treatment of rash.   Patient was asked to drink plenty water but apparently he was not able to drink enough water because he remained sleepy most of the hours in the day.  Repeat blood work was obtained on 3/23. Hemoglobin was noted to be low at 8.1, creatinine elevated to 4.58.  Patient was admitted to hospitalist service for further evaluation management  Subjective: Patient was seen and examined this morning.  Pleasant elderly Caucasian male.  Propped up in bed.  Hard of hearing.  Patient is slow to respond and confused today.  RN reported significant confusion in the morning which seemed to have been clearing up by the time of my evaluation.  Labs reviewed.  Creatinine worse at 5.36 today.  Assessment/Plan: Acute kidney injury on CKD3-4 -Baseline creatinine between 2-2.2 -Presented with a creatinine of 4.58, it is up to 5.36 this morning despite IV hydration. -Nephrology consultation called today. -Continue to hold chlorthalidone.  Acute metabolic encephalopathy -Patient is confused this morning.  Fluctuating mental status.  Not restless or agitated.   -Likely secondary to combination of uremia as well as subclinical dementia flaring up in the hospital. -Continue to monitor mental status.  Remeron as needed at bedtime.  CLL (chronic lymphocytic leukemia) - WBC was 82.5 on admission, 73  today.- it has been in 30-60 range in the past - Dr Irene Watkins is aware of his admission - Venclexta has been on hold  Anemia - Hb 8.1-per the ED doctor, Dr Irene Watkins feels the patient is hemo concentrated and his actually Hb is much lower-1 unit PRBC was transfused. -Repeat hemoglobin this morning is at 8.5.  Continue to monitor hemoglobin.  DM (diabetes mellitus), type 2  -Home meds include glimepiride, ReliOn 70/30 insulin, 70 units twice a day -Insulin requirement is currently low because of poor creatinine clearance. -Currently on sliding scale insulin only.  Home meds are on hold. -I also start the patient on Lantus 5 units at this time for better coverage.   HTN -Home meds include Coreg 25 mg twice daily and chlorthalidone.  -Continue Coreg at low-dose of 6.25 mg twice daily.  Keep chlorthalidone on hold.  -Continue to monitor blood pressure and heart rate.  DVT prophylaxis: Heparin Code Status: Full code  Family Communication: : Left a message to his wife. Disposition Plan: from home  Consults called: none  Admission status: inpatient   Code Status:  Full code DVT prophylaxis: SCD boots.  Avoid heparin because of low platelets Antimicrobials:  None Fluid: Normal saline at 75 mils per hour Diet: Diabetic diet Mobility: PT eval Family Communication:  Spoke with wife and son at bedside Discharge plan:  Anticipated date and disposition: Anticipate 2-3 more days of stay Barriers: Elevated creatinine  Consultants:  None  Antimicrobials: Anti-infectives (From admission, onward)   None  Code Status: Full Code   Diet Order            Diet regular Room service appropriate? Yes; Fluid consistency: Thin  Diet effective now              Infusions:  . sodium chloride 75 mL/hr at 10/20/19 0248    Scheduled Meds: . sodium chloride   Intravenous Once  . aspirin EC  81 mg Oral Daily  . carvedilol  6.25 mg Oral BID WC  . feeding supplement (ENSURE ENLIVE)  237 mL  Oral BID BM  . feeding supplement (PRO-STAT SUGAR FREE 64)  30 mL Oral BID  . insulin aspart  0-15 Units Subcutaneous TID WC  . insulin aspart  0-5 Units Subcutaneous QHS  . pantoprazole  40 mg Oral Daily    PRN meds: acetaminophen **OR** acetaminophen, mirtazapine, ondansetron **OR** ondansetron (ZOFRAN) IV   Objective: Vitals:   10/19/19 2204 10/20/19 0607  BP: (!) 116/46 (!) 115/49  Pulse: 81 85  Resp: 18 18  Temp: 98.6 F (37 C) 98.4 F (36.9 C)  SpO2: 96% 96%    Intake/Output Summary (Last 24 hours) at 10/20/2019 1254 Last data filed at 10/20/2019 0310 Gross per 24 hour  Intake 969.3 ml  Output 525 ml  Net 444.3 ml   Filed Weights   10/18/19 1153  Weight: 98.9 kg   Weight change:  Body mass index is 27.99 kg/m.   Physical Exam: General exam: Appears calm and comfortable.  Skin: No rashes, lesions or ulcers. HEENT: Atraumatic, normocephalic, supple neck, no obvious bleeding Lungs: Clear to auscultation bilaterally CVS: Regular rate and rhythm, no murmur GI/Abd soft, nontender, nondistended, bowel sound present CNS: Slow to respond.  Alert, awake, oriented to place and person.  Knows it is March. Psychiatry: Mood appropriate Extremities: Pedal edema trace bilaterally but also has a skin puckering probably recent dehydration.  Data Review: I have personally reviewed the laboratory data and studies available.  Recent Labs  Lab 10/18/19 1013 10/18/19 2038 10/19/19 0542 10/20/19 0858  WBC 82.5*  82.5*  --  74.7* 72.7*  NEUTROABS 9.1*  --   --  5.3  HGB 8.1*  8.1* 8.4* 8.4* 8.5*  HCT 26.3* 27.2* 26.5* 27.6*  MCV 106.9*  106.9*  --  101.5* 104.9*  PLT 79*  79*  --  69* 74*   Recent Labs  Lab 10/18/19 1013 10/18/19 1313 10/19/19 0542 10/20/19 0858  NA 142  --  140 141  K 3.5  --  3.6 4.2  CL 108  --  108 110  CO2 23  --  21* 18*  GLUCOSE 91  --  141* 217*  BUN 69*  --  70* 77*  CREATININE 4.58* 4.70* 4.75* 5.36*  CALCIUM 8.2*  --  8.0* 7.8*    MG  --   --   --  2.0  PHOS  --   --   --  4.5    Signed, Terrilee Croak, MD [facility] Pager: [phone] (Secure Chat preferred). 10/20/2019

## 2019-10-20 NOTE — TOC Progression Note (Signed)
Transition of Care [facility]) - Progression Note    Patient Details  Name: Benjamin Watkins MRN: [id number] Date of Birth: February 19, 1933  Transition of [facility] CM/SW [place], [place] Phone Number: 10/20/2019, 11:03 AM  Clinical Narrative:   CSW followed up with pt. Regarding post-discharge recommendations. Pt. Confirms that he does not want [place] services. CSW request to update pt's son on discharge plans which pt refuses. Pt. Also confirms that he does not want a rolling walker either and maintains that his cane is all he needs. Plan for pt. To return home living with wife. Pt previously reported that his son will assist with walking exercises. TOC sign off   Expected Discharge Plan: Home/Self Care Barriers to Discharge: No Barriers Identified  Expected Discharge Plan and Services Expected Discharge Plan: Home/Self Care                                               Social Determinants of Health (SDOH) Interventions    Readmission Risk Interventions No flowsheet data found.

## 2019-10-21 LAB — RENAL FUNCTION PANEL
Albumin: 2.9 g/dL — ABNORMAL LOW (ref 3.5–5.0)
Anion gap: 17 — ABNORMAL HIGH (ref 5–15)
BUN: 82 mg/dL — ABNORMAL HIGH (ref 8–23)
CO2: 15 mmol/L — ABNORMAL LOW (ref 22–32)
Calcium: 7.7 mg/dL — ABNORMAL LOW (ref 8.9–10.3)
Chloride: 110 mmol/L (ref 98–111)
Creatinine, Ser: 5.93 mg/dL — ABNORMAL HIGH (ref 0.61–1.24)
GFR calc Af Amer: 9 mL/min — ABNORMAL LOW (ref >60)
GFR calc non Af Amer: 8 mL/min — ABNORMAL LOW (ref >60)
Glucose, Bld: 268 mg/dL — ABNORMAL HIGH (ref 70–99)
Phosphorus: 4.6 mg/dL (ref 2.5–4.6)
Potassium: 4.5 mmol/L (ref 3.5–5.1)
Sodium: 142 mmol/L (ref 135–145)

## 2019-10-21 LAB — GLUCOSE, CAPILLARY
Glucose-Capillary: 247 mg/dL — ABNORMAL HIGH (ref 70–99)
Glucose-Capillary: 233 mg/dL — ABNORMAL HIGH (ref 70–99)
Glucose-Capillary: 178 mg/dL — ABNORMAL HIGH (ref 70–99)
Glucose-Capillary: 212 mg/dL — ABNORMAL HIGH (ref 70–99)

## 2019-10-21 LAB — URINALYSIS, COMPLETE (UACMP) WITH MICROSCOPIC
Bilirubin Urine: NEGATIVE
Glucose, UA: NEGATIVE mg/dL
Ketones, ur: 5 mg/dL — AB
Leukocytes,Ua: NEGATIVE
Nitrite: NEGATIVE
Protein, ur: 30 mg/dL — AB
Specific Gravity, Urine: 1.011 (ref 1.005–1.030)
pH: 5 (ref 5.0–8.0)

## 2019-10-21 LAB — CREATININE, URINE, RANDOM: Creatinine, Urine: 105.81 mg/dL

## 2019-10-21 LAB — SODIUM, URINE, RANDOM: Sodium, Ur: 32 mmol/L

## 2019-10-21 MED ORDER — CHLORHEXIDINE GLUCONATE CLOTH 2 % EX PADS
6.0000 | MEDICATED_PAD | Freq: Every day | CUTANEOUS | Status: DC
  Administered 2019-10-21 – 2019-10-26 (×5): 6 via TOPICAL

## 2019-10-21 MED ORDER — INSULIN GLARGINE 100 UNIT/ML ~~LOC~~ SOLN
7.0000 [IU] | Freq: Every day | SUBCUTANEOUS | Status: DC
  Administered 2019-10-22: 7 [IU] via SUBCUTANEOUS
  Filled 2019-10-21: qty 0.07

## 2019-10-21 MED ORDER — FAMOTIDINE 20 MG PO TABS
20.0000 mg | ORAL_TABLET | Freq: Every day | ORAL | Status: DC | PRN
  Filled 2019-10-21: qty 1

## 2019-10-21 MED ORDER — SODIUM CHLORIDE 0.9% FLUSH
10.0000 mL | Freq: Two times a day (BID) | INTRAVENOUS | Status: DC
  Administered 2019-10-24 – 2019-10-29 (×2): 10 mL

## 2019-10-21 MED ORDER — SODIUM CHLORIDE 0.9% FLUSH
10.0000 mL | INTRAVENOUS | Status: DC | PRN
  Administered 2019-10-22 – 2019-10-29 (×2): 10 mL

## 2019-10-21 NOTE — Progress Notes (Signed)
Admit: 10/18/2019 LOS: 3  11M with AoCKD3 in setting of CLL and recent use of venetoclax  Subjective:  . Nonoliguric . SCr cont ot worsen . K 4.5, HCO3 15 AG 17 . UA with pyuria, no hematuria, trace protein . On PPI was using PPI prior to admit  03/25 0701 - 03/26 0700 In: 7824 [P.O.:840; I.V.:900] Out: 600 [Urine:600]  Filed Weights   10/18/19 1153  Weight: 98.9 kg    Scheduled Meds: . sodium chloride   Intravenous Once  . aspirin EC  81 mg Oral Daily  . carvedilol  6.25 mg Oral BID WC  . feeding supplement (ENSURE ENLIVE)  237 mL Oral BID BM  . feeding supplement (PRO-STAT SUGAR FREE 64)  30 mL Oral BID  . insulin aspart  0-15 Units Subcutaneous TID WC  . insulin aspart  0-5 Units Subcutaneous QHS  . insulin glargine  5 Units Subcutaneous Daily  . pantoprazole  40 mg Oral Daily   Continuous Infusions: . sodium chloride 75 mL/hr at 10/20/19 0248   PRN Meds:.acetaminophen **OR** acetaminophen, mirtazapine, ondansetron **OR** ondansetron (ZOFRAN) IV  Current Labs: reviewed    Physical Exam:  Blood pressure (!) 92/50, pulse 88, temperature 97.8 F (36.6 C), temperature source Oral, resp. rate 20, height _0  (1.88 m), weight 98.9 kg, SpO2 90 %. NAD Regular, normal S1-S2, no rub Clear bilaterally No significant peripheral edema Diffuse erythematous rash present  A 1. AoCKD3: Etiology unclear.  [place] without obstruction.  Pyuria present and potentially patient has AIN perhaps to venetoclax but also need to consider PPI induced AIN, nonoliguric, GFR continues to decline but no uremia, no indication for dialysis.  I worry especially given his malignancy how well he would do with dialysis in the long-term; tried to broach that issue here today, continue to dialogue.  UA 10.0, labs not entirely consistent with TLS.   2. CLL followed by Dr. [name] 3. DM 4. HTN, BP stable 5. Anemia, TCP, per Heme 6. Inc AG met acidosis 2/2 #1  P . Stop PPI therapy, working theory is AIN from  drug . No indication for dialysis, but very likely to develop . We will follow closely . Daily weights, Daily Renal Panel, Strict I/Os, Avoid nephrotoxins (NSAIDs, judicious IV Contrast)    Pearson Grippe MD 10/21/2019, 1:45 PM  Recent Labs  Lab 10/19/19 0542 10/20/19 0858 10/20/19 1348 10/21/19 0614  NA 140 141  --  142  K 3.6 4.2  --  4.5  CL 108 110  --  110  CO2 21* 18*  --  15*  GLUCOSE 141* 217*  --  268*  BUN 70* 77*  --  82*  CREATININE 4.75* 5.36*  --  5.93*  CALCIUM 8.0* 7.8*  --  7.7*  PHOS  --  4.5 4.5 4.6   Recent Labs  Lab 10/18/19 1013 10/18/19 1013 10/18/19 2038 10/19/19 0542 10/20/19 0858  WBC 82.5*  82.5*  --   --  74.7* 72.7*  NEUTROABS 9.1*  --   --   --  5.3  HGB 8.1*  8.1*   < > 8.4* 8.4* 8.5*  HCT 26.3*   < > 27.2* 26.5* 27.6*  MCV 106.9*  106.9*  --   --  101.5* 104.9*  PLT 79*  79*  --   --  69* 74*   < > = values in this interval not displayed.

## 2019-10-21 NOTE — Progress Notes (Signed)
PROGRESS NOTE  Benjamin Watkins  DOB: 1932/12/20  PCP: [name], MD KZL:[id number]  DOA: 10/18/2019 Admitted From: Home  LOS: 3 days   Chief Complaint  Patient presents with  . Abnormal Lab   Brief narrative: Benjamin Watkins is a 86 y.o. male with PMH of CLL, HTN, DM2. Patient was sent to ED from oncology office for abnormal labs including creatinine of 4.58.  Patient follows up with Dr. [name] for CLL. He was seen on 3/8, underwent blood work hemoglobin was 8.5, creatinine was 2.59. One of the CLL medications by the name of venetoclax was held.  He was started on oral prednisone for treatment of rash. Patient was asked to drink plenty water but apparently he was not able to drink enough water because he remained sleepy most of the hours in the day.  Repeat blood work was obtained on 3/23. Hemoglobin was noted to be low at 8.1, creatinine elevated to 4.58.  Patient was admitted to hospitalist service for further evaluation management  Subjective: Patient was seen and examined this morning.  Pleasant elderly Caucasian male.  Propped up in bed.  Hard of hearing.  Not much confused like yesterday.  Despite continuous IV hydration, creatinine is worsening.  The has not been making much urine output per RN.  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 -Nephrology consultation appreciated.  Suspect patient developed drug-induced kidney injury secondary to Venclexta, -patient was also on chlorthalidone, PPI. -Offending meds have been stopped.  Acute metabolic encephalopathy -Fluctuating mental status.  Mental status seems to be better this morning. -Likely secondary to combination of uremia as well as subclinical dementia flaring up in the hospital. -Continue to monitor mental status.  Remeron as needed at bedtime.  CLL (chronic lymphocytic  leukemia) - WBC was 82.5 on admission, 73 on last check yesterday on 3/25..- it has been in 30-60 range in the past - Dr [name] is aware of his admission - Venclexta has been on hold  Anemia - Hb 8.1-per the ED doctor, Dr [name] feels the patient is hemo concentrated and his actually Hb is much lower-1 unit PRBC was transfused. -Repeat hemoglobin this morning is at 8.5.  Continue to monitor hemoglobin.  DM (diabetes mellitus), type 2  -Home meds include glimepiride, ReliOn 70/30 insulin, 70 units twice a day -Insulin requirement is currently low because of poor creatinine clearance. -Currently on Lantus 5 units daily as well as sliding scale insulin.  Since blood sugar is high, I would increase Lantus dose to 7 units   HTN -Home meds include Coreg 25 mg twice daily and chlorthalidone.  -Continue Coreg at low-dose of 6.25 mg twice daily.  Keep chlorthalidone on hold.  -Continue to monitor blood pressure and heart rate.  DVT prophylaxis: Heparin Code Status: Full code  Family Communication:  Unable to communicate with wife today Disposition Plan: from home  Consults called: none  Admission status: inpatient   Consultants:  Nephrology.  Antimicrobials: Anti-infectives (From admission, onward)   None        Code Status: Full Code   Diet Order            Diet regular Room service appropriate? Yes; Fluid consistency: Thin  Diet effective now              Infusions:  . sodium chloride 75 mL/hr at 10/20/19 0248    Scheduled  Meds: . sodium chloride   Intravenous Once  . aspirin EC  81 mg Oral Daily  . carvedilol  6.25 mg Oral BID WC  . feeding supplement (ENSURE ENLIVE)  237 mL Oral BID BM  . feeding supplement (PRO-STAT SUGAR FREE 64)  30 mL Oral BID  . insulin aspart  0-15 Units Subcutaneous TID WC  . insulin aspart  0-5 Units Subcutaneous QHS  . [START ON 10/22/2019] insulin glargine  7 Units Subcutaneous Daily    PRN meds: acetaminophen **OR** acetaminophen,  famotidine, mirtazapine, ondansetron **OR** ondansetron (ZOFRAN) IV   Objective: Vitals:   10/20/19 2133 10/21/19 0615  BP: (!) 122/57 (!) 92/50  Pulse: 81 88  Resp: 20 20  Temp: 98.1 F (36.7 C) 97.8 F (36.6 C)  SpO2: 96% 90%    Intake/Output Summary (Last 24 hours) at 10/21/2019 1517 Last data filed at 10/21/2019 0933 Gross per 24 hour  Intake 1740 ml  Output 825 ml  Net 915 ml   Filed Weights   10/18/19 1153  Weight: 98.9 kg   Weight change:  Body mass index is 27.99 kg/m.   Physical Exam: General exam: Appears calm and comfortable.  Skin: No rashes, lesions or ulcers. HEENT: Atraumatic, normocephalic, supple neck, no obvious bleeding Lungs: Clear to auscultation bilaterally CVS: Regular rate and rhythm, no murmur GI/Abd soft, nontender, nondistended, bowel sound present CNS: Slow to respond.  Alert, awake, oriented to place and person.  Psychiatry: Mood appropriate Extremities: Pedal edema trace bilaterally but also has a skin puckering probably recent dehydration.  Data Review: I have personally reviewed the laboratory data and studies available.  Recent Labs  Lab 10/18/19 1013 10/18/19 2038 10/19/19 0542 10/20/19 0858  WBC 82.5*  82.5*  --  74.7* 72.7*  NEUTROABS 9.1*  --   --  5.3  HGB 8.1*  8.1* 8.4* 8.4* 8.5*  HCT 26.3* 27.2* 26.5* 27.6*  MCV 106.9*  106.9*  --  101.5* 104.9*  PLT 79*  79*  --  69* 74*   Recent Labs  Lab 10/18/19 1013 10/18/19 1313 10/19/19 0542 10/20/19 0858 10/20/19 1348 10/21/19 0614  NA 142  --  140 141  --  142  K 3.5  --  3.6 4.2  --  4.5  CL 108  --  108 110  --  110  CO2 23  --  21* 18*  --  15*  GLUCOSE 91  --  141* 217*  --  268*  BUN 69*  --  70* 77*  --  82*  CREATININE 4.58* 4.70* 4.75* 5.36*  --  5.93*  CALCIUM 8.2*  --  8.0* 7.8*  --  7.7*  MG  --   --   --  2.0  --   --   PHOS  --   --   --  4.5 4.5 4.6    Signed, Terrilee Croak, MD [facility] Pager: [phone] (Secure Chat  preferred). 10/21/2019

## 2019-10-21 NOTE — Progress Notes (Signed)
PT Cancellation Note  Patient Details Name: Benjamin Watkins MRN: [id number] DOB: Apr 20, 1933   Cancelled Treatment:    Reason Eval/Treat Not Completed: Patient declined, no reason specified. Attempted therapy 10:40, but pt refused without reason. Attempted therapy second time at 2:10, but pt continues to refuse therapy reports the doctor told him "I could go at anytime" and when questioned reports "I could die at anytime". Therapist got RN in room to assist in questioning cause of confusion, educate pt on his current kidney function and benefit of participating with therapy to improve strength and return home. Pt continues to refuse therapy at this time. Will continue to follow acutely.   Tori Johnmichael Melhorn PT, DPT 10/21/19, 2:18 PM [phone]

## 2019-10-22 ENCOUNTER — Encounter (HOSPITAL_COMMUNITY): Payer: Self-pay | Admitting: Internal Medicine

## 2019-10-22 LAB — BASIC METABOLIC PANEL
Anion gap: 13 (ref 5–15)
BUN: 93 mg/dL — ABNORMAL HIGH (ref 8–23)
CO2: 19 mmol/L — ABNORMAL LOW (ref 22–32)
Calcium: 7.4 mg/dL — ABNORMAL LOW (ref 8.9–10.3)
Chloride: 111 mmol/L (ref 98–111)
Creatinine, Ser: 6.47 mg/dL — ABNORMAL HIGH (ref 0.61–1.24)
GFR calc Af Amer: 8 mL/min — ABNORMAL LOW (ref >60)
GFR calc non Af Amer: 7 mL/min — ABNORMAL LOW (ref >60)
Glucose, Bld: 219 mg/dL — ABNORMAL HIGH (ref 70–99)
Potassium: 4 mmol/L (ref 3.5–5.1)
Sodium: 143 mmol/L (ref 135–145)

## 2019-10-22 LAB — CBC WITH DIFFERENTIAL/PLATELET
Abs Immature Granulocytes: 0.46 10*3/uL — ABNORMAL HIGH (ref 0.00–0.07)
Basophils Absolute: 0.3 10*3/uL — ABNORMAL HIGH (ref 0.0–0.1)
Basophils Relative: 0 %
Eosinophils Absolute: 0.7 10*3/uL — ABNORMAL HIGH (ref 0.0–0.5)
Eosinophils Relative: 1 %
HCT: 26.2 % — ABNORMAL LOW (ref 39.0–52.0)
Hemoglobin: 8.1 g/dL — ABNORMAL LOW (ref 13.0–17.0)
Immature Granulocytes: 1 %
Lymphocytes Relative: 71 %
Lymphs Abs: 53.5 10*3/uL — ABNORMAL HIGH (ref 0.7–4.0)
MCH: 32.4 pg (ref 26.0–34.0)
MCHC: 30.9 g/dL (ref 30.0–36.0)
MCV: 104.8 fL — ABNORMAL HIGH (ref 80.0–100.0)
Monocytes Absolute: 14.1 10*3/uL — ABNORMAL HIGH (ref 0.1–1.0)
Monocytes Relative: 19 %
Neutro Abs: 6.2 10*3/uL (ref 1.7–7.7)
Neutrophils Relative %: 8 %
Platelets: 91 10*3/uL — ABNORMAL LOW (ref 150–400)
RBC: 2.5 MIL/uL — ABNORMAL LOW (ref 4.22–5.81)
RDW: 20 % — ABNORMAL HIGH (ref 11.5–15.5)
WBC Morphology: ABNORMAL
WBC: 75.4 10*3/uL (ref 4.0–10.5)
nRBC: 0.1 % (ref 0.0–0.2)

## 2019-10-22 LAB — GLUCOSE, CAPILLARY
Glucose-Capillary: 242 mg/dL — ABNORMAL HIGH (ref 70–99)
Glucose-Capillary: 268 mg/dL — ABNORMAL HIGH (ref 70–99)
Glucose-Capillary: 176 mg/dL — ABNORMAL HIGH (ref 70–99)
Glucose-Capillary: 143 mg/dL — ABNORMAL HIGH (ref 70–99)

## 2019-10-22 LAB — RENAL FUNCTION PANEL
Albumin: 2.9 g/dL — ABNORMAL LOW (ref 3.5–5.0)
Anion gap: 13 (ref 5–15)
BUN: 97 mg/dL — ABNORMAL HIGH (ref 8–23)
CO2: 19 mmol/L — ABNORMAL LOW (ref 22–32)
Calcium: 7.5 mg/dL — ABNORMAL LOW (ref 8.9–10.3)
Chloride: 112 mmol/L — ABNORMAL HIGH (ref 98–111)
Creatinine, Ser: 6.81 mg/dL — ABNORMAL HIGH (ref 0.61–1.24)
GFR calc Af Amer: 8 mL/min — ABNORMAL LOW (ref >60)
GFR calc non Af Amer: 7 mL/min — ABNORMAL LOW (ref >60)
Glucose, Bld: 221 mg/dL — ABNORMAL HIGH (ref 70–99)
Phosphorus: 5.3 mg/dL — ABNORMAL HIGH (ref 2.5–4.6)
Potassium: 3.9 mmol/L (ref 3.5–5.1)
Sodium: 144 mmol/L (ref 135–145)

## 2019-10-22 MED ORDER — INSULIN GLARGINE 100 UNIT/ML ~~LOC~~ SOLN
10.0000 [IU] | Freq: Every day | SUBCUTANEOUS | Status: DC
  Administered 2019-10-23: 10 [IU] via SUBCUTANEOUS
  Filled 2019-10-22: qty 0.1

## 2019-10-22 MED ORDER — SODIUM CHLORIDE 0.9 % IV SOLN: INTRAVENOUS | Status: DC

## 2019-10-22 NOTE — Progress Notes (Signed)
Admit: 10/18/2019 LOS: 4  38M with AoCKD3 in setting of CLL and recent use of venetoclax  Subjective:  . Nonoliguric UOP > 0.8L . SCr cont to worsen . K 3.9, HCO3 improved to 17 . Pt denies anorexia, n/v.  No asterixus  03/26 0701 - 03/27 0700 In: 10 [I.V.:10] Out: 800 [Urine:800]  Filed Weights   10/18/19 1153  Weight: 98.9 kg    Scheduled Meds: . sodium chloride   Intravenous Once  . aspirin EC  81 mg Oral Daily  . carvedilol  6.25 mg Oral BID WC  . Chlorhexidine Gluconate Cloth  6 each Topical Daily  . feeding supplement (ENSURE ENLIVE)  237 mL Oral BID BM  . feeding supplement (PRO-STAT SUGAR FREE 64)  30 mL Oral BID  . insulin aspart  0-15 Units Subcutaneous TID WC  . insulin aspart  0-5 Units Subcutaneous QHS  . insulin glargine  7 Units Subcutaneous Daily  . sodium chloride flush  10-40 mL Intracatheter Q12H   Continuous Infusions: . sodium chloride 75 mL/hr at 10/20/19 0248   PRN Meds:.acetaminophen **OR** acetaminophen, famotidine, mirtazapine, ondansetron **OR** ondansetron (ZOFRAN) IV, sodium chloride flush  Current Labs: reviewed    Physical Exam:  Blood pressure (!) 98/58, pulse 91, temperature 97.7 F (36.5 C), resp. rate 19, height 6' 2"  (1.88 m), weight 98.9 kg, SpO2 97 %. NAD Regular, normal S1-S2, no rub Clear bilaterally No significant peripheral edema Diffuse erythematous rash present  A 1. AoCKD3: Etiology unclear.  [place] without obstruction.  Pyuria present and potentially patient has AIN (rash present at admission) perhaps to venetoclax but also need to consider PPI induced AIN. Nonoliguric, GFR continues to decline but no uremia, no indication for dialysis.  I worry especially given his malignancy how well he would do with dialysis in the long-term; tried to broach that issue here today, continue to dialogue.  UA 10.0, labs not entirely consistent with TLS.   2. CLL followed by Dr. [name] 3. DM 4. HTN, BP stable 5. Anemia, TCP, per Heme 6. Inc  AG met acidosis 2/2 #1  P . Stop PPI therapy, working theory is AIN from drug; use H2B prn . No indication for dialysis, but very likely to develop . As we look at potential short or long term HD, need some input from oncology here, are there other therapies which could be effective? What is likley prognosis re CLL from here? . Stop IVFs; is not hypovolemic at this time . We will follow closely . Daily weights, Daily Renal Panel, Strict I/Os, Avoid nephrotoxins (NSAIDs, judicious IV Contrast)    Pearson Grippe MD 10/22/2019, 5:57 AM  Recent Labs  Lab 10/20/19 0858 10/20/19 0858 10/20/19 1348 10/21/19 0614 10/22/19 0449  NA 141  --   --  142 144  143  K 4.2  --   --  4.5 3.9  4.0  CL 110  --   --  110 112*  111  CO2 18*  --   --  15* 19*  19*  GLUCOSE 217*  --   --  268* 221*  219*  BUN 77*  --   --  82* 97*  93*  CREATININE 5.36*  --   --  5.93* 6.81*  6.47*  CALCIUM 7.8*  --   --  7.7* 7.5*  7.4*  PHOS 4.5   < > 4.5 4.6 5.3*   < > = values in this interval not displayed.   Recent Labs  Lab 10/18/19  1013 10/18/19 1013 10/18/19 2038 10/19/19 0542 10/20/19 0858  WBC 82.5*  82.5*  --   --  74.7* 72.7*  NEUTROABS 9.1*  --   --   --  5.3  HGB 8.1*  8.1*   < > 8.4* 8.4* 8.5*  HCT 26.3*   < > 27.2* 26.5* 27.6*  MCV 106.9*  106.9*  --   --  101.5* 104.9*  PLT 79*  79*  --   --  69* 74*   < > = values in this interval not displayed.

## 2019-10-22 NOTE — Progress Notes (Signed)
PROGRESS NOTE  Benjamin Watkins  DOB: 1933-02-01  PCP: [name], MD RJJ:[id number]  DOA: 10/18/2019 Admitted From: Home  LOS: 4 days   Chief Complaint  Patient presents with  . Abnormal Lab   Brief narrative: Benjamin Watkins is a 86 y.o. male with PMH of CLL, HTN, DM2. Patient was sent to ED from oncology office for abnormal labs including creatinine of 4.58.  Patient follows up with Dr. [name] for CLL. He was seen on 3/8, underwent blood work hemoglobin was 8.5, creatinine was 2.59. One of the CLL medications by the name of venetoclax was held.  He was started on oral prednisone for treatment of rash. Patient was asked to drink plenty water but apparently he was not able to drink enough water because he remained sleepy most of the hours in the day.  Repeat blood work was obtained on 3/23. Hemoglobin was noted to be low at 8.1, creatinine elevated to 4.58.  Patient was admitted to hospitalist service for further evaluation management  Subjective: Patient was seen and examined this morning.  Pleasant elderly Caucasian male.  Propped up in bed.  Hard of hearing.   Has a Foley catheter on now. He is slow to respond and somewhat confused.  Not in physical distress.  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 -Nephrology consultation appreciated.  Suspect patient developed drug-induced kidney injury secondary to Venclexta, -patient was also on chlorthalidone, PPI. -Offending meds have been stopped. -Patient seems to be unfortunately heading towards requiring dialysis.  -I discussed with nephrologist Dr. Joelyn Oms. He thinks patient is not dehydrated. IV fluid was stopped. Kidney function to be monitored over the weekend.  Intake/Output Summary (Last 24 hours) at 10/22/2019 1202 Last data filed at 10/22/2019 0855 Gross per 24 hour  Intake 250 ml    Output 1125 ml  Net -875 ml   Acute metabolic encephalopathy -Fluctuating mental status.  Seems weak and confused this morning.  Likely secondary to a combination of uremia as well as hospital induced delirium. -Continue to monitor mental status.  Patient has Remeron as needed at bedtime.  CLL (chronic lymphocytic leukemia) -WBC was 82.5 on admission, 73 on last check yesterday on 3/25..- it has been in 30-60 range in the past -Dr [name] is aware of his admission -Venclexta has been on hold  Anemia -Hb 8.1 on presentation. 1 unit of PRBC was given. Hemoglobin remains between 8-9.   -Continue to monitor.  DM (diabetes mellitus), type 2  -Home meds include glimepiride, ReliOn 70/30 insulin, 70 units twice a day -Insulin requirement is currently low because of poor creatinine clearance. -Currently on Lantus 7 units daily as well as sliding scale insulin. Since blood sugar is high, I would increase Lantus dose to 10 units.   HTN -Home meds include Coreg 25 mg twice daily and chlorthalidone.  -Continue Coreg at low-dose of 6.25 mg twice daily.  Keep chlorthalidone on hold.  -Continue to monitor blood pressure and heart rate.  DVT prophylaxis: Heparin Code Status: Full code  Family Communication:   Disposition Plan: from home  Consults called: none  Admission status: inpatient   Consultants:  Nephrology.  Antimicrobials: Anti-infectives (From admission, onward)   None        Code Status: Full Code   Diet Order            Diet regular Room service appropriate? Yes;  Fluid consistency: Thin  Diet effective now              Infusions:    Scheduled Meds: . sodium chloride   Intravenous Once  . aspirin EC  81 mg Oral Daily  . carvedilol  6.25 mg Oral BID WC  . Chlorhexidine Gluconate Cloth  6 each Topical Daily  . feeding supplement (ENSURE ENLIVE)  237 mL Oral BID BM  . feeding supplement (PRO-STAT SUGAR FREE 64)  30 mL Oral BID  . insulin aspart  0-15 Units  Subcutaneous TID WC  . insulin aspart  0-5 Units Subcutaneous QHS  . insulin glargine  7 Units Subcutaneous Daily  . sodium chloride flush  10-40 mL Intracatheter Q12H    PRN meds: acetaminophen **OR** acetaminophen, famotidine, mirtazapine, ondansetron **OR** ondansetron (ZOFRAN) IV, sodium chloride flush   Objective: Vitals:   10/21/19 2145 10/22/19 0500  BP: (!) 107/46 (!) 98/58  Pulse: 80 91  Resp: 19 20  Temp: 98.3 F (36.8 C) 97.7 F (36.5 C)  SpO2: 100% 97%    Intake/Output Summary (Last 24 hours) at 10/22/2019 1200 Last data filed at 10/22/2019 0855 Gross per 24 hour  Intake 250 ml  Output 1125 ml  Net -875 ml   Filed Weights   10/18/19 1153  Weight: 98.9 kg   Weight change:  Body mass index is 27.99 kg/m.   Physical Exam: General exam: Appears calm and comfortable.  Skin: No rashes, lesions or ulcers. HEENT: Atraumatic, normocephalic, supple neck, no obvious bleeding Lungs: Clear to auscultation bilaterally CVS: Regular rate and rhythm, no murmur GI/Abd soft, nontender, nondistended, bowel sound present CNS: Slow to respond.  Alert, awake, oriented to place and person.  Psychiatry: Mood appropriate Extremities: Pedal edema trace bilaterally but also has skin puckering probably recent dehydration.  Data Review: I have personally reviewed the laboratory data and studies available.  Recent Labs  Lab 10/18/19 1013 10/18/19 2038 10/19/19 0542 10/20/19 0858 10/22/19 0449  WBC 82.5*  82.5*  --  74.7* 72.7* 75.4*  NEUTROABS 9.1*  --   --  5.3 6.2  HGB 8.1*  8.1* 8.4* 8.4* 8.5* 8.1*  HCT 26.3* 27.2* 26.5* 27.6* 26.2*  MCV 106.9*  106.9*  --  101.5* 104.9* 104.8*  PLT 79*  79*  --  69* 74* 91*   Recent Labs  Lab 10/18/19 1013 10/18/19 1313 10/19/19 0542 10/20/19 0858 10/20/19 1348 10/21/19 0614 10/22/19 0449  NA 142  --  140 141  --  142 144  143  K 3.5  --  3.6 4.2  --  4.5 3.9  4.0  CL 108  --  108 110  --  110 112*  111  CO2 23  --   21* 18*  --  15* 19*  19*  GLUCOSE 91  --  141* 217*  --  268* 221*  219*  BUN 69*  --  70* 77*  --  82* 97*  93*  CREATININE 4.58* 4.70* 4.75* 5.36*  --  5.93* 6.81*  6.47*  CALCIUM 8.2*  --  8.0* 7.8*  --  7.7* 7.5*  7.4*  MG  --   --   --  2.0  --   --   --   PHOS  --   --   --  4.5 4.5 4.6 5.3*    Signed, Terrilee Croak, MD [facility] Pager: [phone] (Secure Chat preferred). 10/22/2019

## 2019-10-22 NOTE — Progress Notes (Signed)
   Vital Signs MEWS/VS Documentation      10/22/2019 1335 10/22/2019 1345 10/22/2019 1542 10/22/2019 1600   MEWS Score:  3  1  3  4    MEWS Score Color:  Yellow  Green  Yellow  Red   Resp:  20  (!) 24  (!) 22  (!) 22   Pulse:  (!) 146  85  (!) 110  (!) 118   BP:  (!) 129/91  --  (!) 92/45  (!) 95/37   Temp:  98.1 F (36.7 C)  --  97.7 F (36.5 C)  97.8 F (36.6 C)   O2 Device:  Room Air  --  Room Air  Room Air   Level of Consciousness:  --  --  Alert  Alert     Pt is now in the RED MEWS due to elevated HR, low BP, elevated RR. Paged MD and will start pt on IV fluids because the pt is dehydrated. Will implement the red MEWS and continue to monitor closely.   Fortunato Curling 10/22/2019,4:19 PM

## 2019-10-23 LAB — CBC WITH DIFFERENTIAL/PLATELET
Abs Immature Granulocytes: 0.46 10*3/uL — ABNORMAL HIGH (ref 0.00–0.07)
Basophils Absolute: 0.3 10*3/uL — ABNORMAL HIGH (ref 0.0–0.1)
Basophils Relative: 0 %
Eosinophils Absolute: 0.9 10*3/uL — ABNORMAL HIGH (ref 0.0–0.5)
Eosinophils Relative: 1 %
HCT: 24.8 % — ABNORMAL LOW (ref 39.0–52.0)
Hemoglobin: 7.7 g/dL — ABNORMAL LOW (ref 13.0–17.0)
Immature Granulocytes: 1 %
Lymphocytes Relative: 74 %
Lymphs Abs: 62 10*3/uL — ABNORMAL HIGH (ref 0.7–4.0)
MCH: 32.8 pg (ref 26.0–34.0)
MCHC: 31 g/dL (ref 30.0–36.0)
MCV: 105.5 fL — ABNORMAL HIGH (ref 80.0–100.0)
Monocytes Absolute: 12.9 10*3/uL — ABNORMAL HIGH (ref 0.1–1.0)
Monocytes Relative: 16 %
Neutro Abs: 6.6 10*3/uL (ref 1.7–7.7)
Neutrophils Relative %: 8 %
Platelets: 92 10*3/uL — ABNORMAL LOW (ref 150–400)
RBC: 2.35 MIL/uL — ABNORMAL LOW (ref 4.22–5.81)
RDW: 20.1 % — ABNORMAL HIGH (ref 11.5–15.5)
WBC: 83.1 10*3/uL (ref 4.0–10.5)
nRBC: 0.1 % (ref 0.0–0.2)

## 2019-10-23 LAB — GLUCOSE, CAPILLARY
Glucose-Capillary: 224 mg/dL — ABNORMAL HIGH (ref 70–99)
Glucose-Capillary: 286 mg/dL — ABNORMAL HIGH (ref 70–99)
Glucose-Capillary: 202 mg/dL — ABNORMAL HIGH (ref 70–99)
Glucose-Capillary: 172 mg/dL — ABNORMAL HIGH (ref 70–99)

## 2019-10-23 LAB — BASIC METABOLIC PANEL
Anion gap: 13 (ref 5–15)
BUN: 99 mg/dL — ABNORMAL HIGH (ref 8–23)
CO2: 18 mmol/L — ABNORMAL LOW (ref 22–32)
Calcium: 7.1 mg/dL — ABNORMAL LOW (ref 8.9–10.3)
Chloride: 112 mmol/L — ABNORMAL HIGH (ref 98–111)
Creatinine, Ser: 6.91 mg/dL — ABNORMAL HIGH (ref 0.61–1.24)
GFR calc Af Amer: 8 mL/min — ABNORMAL LOW (ref >60)
GFR calc non Af Amer: 7 mL/min — ABNORMAL LOW (ref >60)
Glucose, Bld: 212 mg/dL — ABNORMAL HIGH (ref 70–99)
Potassium: 3.6 mmol/L (ref 3.5–5.1)
Sodium: 143 mmol/L (ref 135–145)

## 2019-10-23 LAB — RENAL FUNCTION PANEL
Albumin: 2.7 g/dL — ABNORMAL LOW (ref 3.5–5.0)
Anion gap: 11 (ref 5–15)
BUN: 95 mg/dL — ABNORMAL HIGH (ref 8–23)
CO2: 18 mmol/L — ABNORMAL LOW (ref 22–32)
Calcium: 7 mg/dL — ABNORMAL LOW (ref 8.9–10.3)
Chloride: 110 mmol/L (ref 98–111)
Creatinine, Ser: 6.84 mg/dL — ABNORMAL HIGH (ref 0.61–1.24)
GFR calc Af Amer: 8 mL/min — ABNORMAL LOW (ref >60)
GFR calc non Af Amer: 7 mL/min — ABNORMAL LOW (ref >60)
Glucose, Bld: 210 mg/dL — ABNORMAL HIGH (ref 70–99)
Phosphorus: 4.7 mg/dL — ABNORMAL HIGH (ref 2.5–4.6)
Potassium: 3.6 mmol/L (ref 3.5–5.1)
Sodium: 139 mmol/L (ref 135–145)

## 2019-10-23 MED ORDER — INSULIN GLARGINE 100 UNIT/ML ~~LOC~~ SOLN
12.0000 [IU] | Freq: Every day | SUBCUTANEOUS | Status: DC
  Administered 2019-10-24 – 2019-10-26 (×3): 12 [IU] via SUBCUTANEOUS
  Filled 2019-10-23 (×4): qty 0.12

## 2019-10-23 MED ORDER — CARVEDILOL 3.125 MG PO TABS
3.1250 mg | ORAL_TABLET | Freq: Two times a day (BID) | ORAL | Status: DC
  Administered 2019-10-23 – 2019-10-24 (×2): 3.125 mg via ORAL
  Filled 2019-10-23 (×2): qty 1

## 2019-10-23 NOTE — Progress Notes (Signed)
Admit: 10/18/2019 LOS: 5  60M with AoCKD3 in setting of CLL and recent use of venetoclax  Subjective:   . 1 L urine output yesterday . Creatinine slightly worsened to 6.9, BUN 99, K3.6, HCO3 18 . Blood pressure stable, on room air . Started back on IV fluids yesterday after some hypotension and tachycardia, blood pressure soft but stable here today, on carvedilol . Again discussed possibility of needing short or maybe long-term dialysis, patient was fairly functional prior to admission, independent. . Pt denies anorexia, n/v.  No asterixus; eating some on every meal, taking in Ensure and fluids  03/27 0701 - 03/28 0700 In: 480 [P.O.:480] Out: 1000 [Urine:1000]  Filed Weights   10/18/19 1153  Weight: 98.9 kg    Scheduled Meds: . sodium chloride   Intravenous Once  . aspirin EC  81 mg Oral Daily  . carvedilol  6.25 mg Oral BID WC  . Chlorhexidine Gluconate Cloth  6 each Topical Daily  . feeding supplement (ENSURE ENLIVE)  237 mL Oral BID BM  . feeding supplement (PRO-STAT SUGAR FREE 64)  30 mL Oral BID  . insulin aspart  0-15 Units Subcutaneous TID WC  . insulin aspart  0-5 Units Subcutaneous QHS  . insulin glargine  10 Units Subcutaneous Daily  . sodium chloride flush  10-40 mL Intracatheter Q12H   Continuous Infusions: . sodium chloride 100 mL/hr at 10/22/19 1701   PRN Meds:.acetaminophen **OR** acetaminophen, famotidine, mirtazapine, ondansetron **OR** ondansetron (ZOFRAN) IV, sodium chloride flush  Current Labs: reviewed    Physical Exam:  Blood pressure (!) 106/58, pulse 83, temperature 98 F (36.7 C), temperature source Oral, resp. rate 19, height _0  (1.88 m), weight 98.9 kg, SpO2 98 %. NAD  Regular, normal S1-S2, no rub Clear bilaterally No significant peripheral edema Diffuse erythematous rash present, lightening in color No asterixis  A 1. AoCKD3: Etiology unclear.  [place] without obstruction.  Pyuria present and potentially patient has AIN (rash present at  admission) perhaps to venetoclax but also possible is PPI induced AIN. Nonoliguric, GFR continues to decline but no uremia, no indication for dialysis.  I worry especially given his malignancy how well he would do with dialysis in the long-term; tried to broach that issue here over the weekend, continue to dialogue.  I do think short-term dialysis, anticipating some recovery of GFR, would be very reasonable.  Thankfully, not yet required but certainly possible.  Will need to follow closely.   2. CLL followed by Dr. [name], appears to have had limiting adverse effects to venetoclax, are there other reasonable therapies remaining?  Recommend hematology input tomorrow 3. DM 4. HTN, BP stable on carvedilol, consider dos reduction 5. Anemia, TCP, per Heme 6. Inc AG met acidosis 2/2 #1  P . No indication for dialysis, but possible to develop . As we look at potential short or long term HD, need some input from oncology here, are there other therapies which could be effective? What is likley prognosis re CLL from here?  . Seems he is taking in adequate p.o., I would stop IV fluids today given risk of hypervolemia (though not clinically present here today), will let hospitalist evaluate patient as well we will follow closely . Daily weights, Daily Renal Panel, Strict I/Os, Avoid nephrotoxins (NSAIDs, judicious IV Contrast)    Pearson Grippe MD 10/23/2019, 7:26 AM  Recent Labs  Lab 10/20/19 1594 10/20/19 1348 10/21/19 0614 10/22/19 0449 10/23/19 0351  NA   < >  --  142 144  143 143  K   < >  --  4.5 3.9  4.0 3.6  CL   < >  --  110 112*  111 112*  CO2   < >  --  15* 19*  19* 18*  GLUCOSE   < >  --  268* 221*  219* 212*  BUN   < >  --  82* 97*  93* 99*  CREATININE   < >  --  5.93* 6.81*  6.47* 6.91*  CALCIUM   < >  --  7.7* 7.5*  7.4* 7.1*  PHOS  --  4.5 4.6 5.3*  --    < > = values in this interval not displayed.   Recent Labs  Lab 10/20/19 0858 10/22/19 0449 10/23/19 0351  WBC 72.7*  75.4* 83.1*  NEUTROABS 5.3 6.2 6.6  HGB 8.5* 8.1* 7.7*  HCT 27.6* 26.2* 24.8*  MCV 104.9* 104.8* 105.5*  PLT 74* 91* 92*

## 2019-10-23 NOTE — Progress Notes (Signed)
PROGRESS NOTE  Benjamin Watkins  DOB: [birth year]  PCP: [name], MD INO:[id number]  DOA: 10/18/2019 Admitted From: Home  LOS: 5 days   Chief Complaint  Patient presents with  . Abnormal Lab   Brief narrative: Benjamin Watkins is a 86 y.o. male with PMH of CLL, HTN, DM2. Patient was sent to ED from oncology office for abnormal labs including creatinine of 4.58.  Patient follows up with Dr. [name] for CLL. He was seen on 3/8, underwent blood work hemoglobin was 8.5, creatinine was 2.59. One of the CLL medications by the name of venetoclax was held.  He was started on oral prednisone for treatment of rash. Patient was asked to drink plenty water but apparently he was not able to drink enough water because he remained sleepy most of the hours in the day.  Repeat blood work was obtained on 3/23. Hemoglobin was noted to be low at 8.1, creatinine elevated to 4.58.  Patient was admitted to hospitalist service for further evaluation management  Subjective: Patient was seen and examined this morning.  Pleasant elderly Caucasian male.  Propped up in bed.  Hard of hearing.  Slow to respond.  Not restless or agitated.  Noted IV fluid was stopped this morning.  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 -Nephrology consultation appreciated. Suspect patient developed drug-induced kidney injury secondary to Venclexta, -patient was also on chlorthalidone, PPI. -Offending meds have been stopped. -Patient seems to be unfortunately heading towards requiring dialysis.  -On my evaluation, patient looks dehydrated with wrinkled, dry skin.  -Nephrology involved.  Defer to nephrology about any need of IV fluid.  Intake/Output Summary (Last 24 hours) at 10/23/2019 1429 Last data filed at 10/23/2019 0517 Gross per 24 hour  Intake --  Output 1000 ml  Net -1000 ml    Acute metabolic encephalopathy -Fluctuating mental status.  Seems weak and confused this morning.  Likely secondary to a combination of uremia as well as hospital induced delirium. -Continue to monitor mental status.  Patient has Remeron as needed at bedtime.  CLL (chronic lymphocytic leukemia) -WBC was 82.5 on admission, 73 on last check yesterday on 3/25..- it has been in 30-60 range in the past -Dr [name] is aware of his admission -Venclexta has been on hold  Anemia -Hb 8.1 on presentation. 1 unit of PRBC was given. Hemoglobin remains between 8-9.   -Continue to monitor.  DM (diabetes mellitus), type 2  -Home meds include glimepiride, ReliOn 70/30 insulin, 70 units twice a day -Insulin requirement is currently low because of poor creatinine clearance. -Currently on Lantus 10 units daily as well as sliding scale insulin. Since blood sugar is high, I would increase Lantus dose to 12 units.   HTN -Home meds include Coreg 25 mg twice daily and chlorthalidone.  -Continue Coreg at low-dose of 6.25 mg twice daily.  Keep chlorthalidone on hold.  -Continue to monitor blood pressure and heart rate.  DVT prophylaxis: Heparin Code Status: Full code  Family Communication:   Disposition Plan: from home  Consults called: none  Admission status: inpatient   Consultants:  Nephrology.  Antimicrobials: Anti-infectives (From admission, onward)   None        Code Status: Full Code   Diet Order            Diet regular Room service appropriate? Yes; Fluid consistency: Thin  Diet effective now  Infusions:  . sodium chloride 100 mL/hr at 10/22/19 1701    Scheduled Meds: . sodium chloride   Intravenous Once  . aspirin EC  81 mg Oral Daily  . carvedilol  3.125 mg Oral BID WC  . Chlorhexidine Gluconate Cloth  6 each Topical Daily  . feeding supplement (ENSURE ENLIVE)  237 mL Oral BID BM  . feeding supplement (PRO-STAT SUGAR FREE 64)  30 mL Oral BID  . insulin  aspart  0-15 Units Subcutaneous TID WC  . insulin aspart  0-5 Units Subcutaneous QHS  . [START ON 10/24/2019] insulin glargine  12 Units Subcutaneous Daily  . sodium chloride flush  10-40 mL Intracatheter Q12H    PRN meds: acetaminophen **OR** acetaminophen, famotidine, mirtazapine, ondansetron **OR** ondansetron (ZOFRAN) IV, sodium chloride flush   Objective: Vitals:   10/23/19 0517 10/23/19 1158  BP: (!) 106/58 (!) 103/51  Pulse: 83 84  Resp: 19 (!) 26  Temp: 98 F (36.7 C) 98 F (36.7 C)  SpO2: 98% 99%    Intake/Output Summary (Last 24 hours) at 10/23/2019 1429 Last data filed at 10/23/2019 0517 Gross per 24 hour  Intake --  Output 1000 ml  Net -1000 ml   Filed Weights   10/18/19 1153  Weight: 98.9 kg   Weight change:  Body mass index is 27.99 kg/m.   Physical Exam: General exam: Appears calm and comfortable.  Skin: Looks dry, has wrinkled, scaly skin HEENT: Atraumatic, normocephalic, supple neck, no obvious bleeding Lungs: Clear to auscultation bilaterally CVS: Regular rate and rhythm, no murmur GI/Abd soft, nontender, nondistended, bowel sound present CNS: Slow to respond.  Alert, awake, oriented to place and person  psychiatry: Mood appropriate Extremities: Pedal edema trace bilaterally but also has skin puckering probably recent dehydration.  Data Review: I have personally reviewed the laboratory data and studies available.  Recent Labs  Lab 10/18/19 1013 10/18/19 1013 10/18/19 2038 10/19/19 0542 10/20/19 0858 10/22/19 0449 10/23/19 0351  WBC 82.5*  82.5*  --   --  74.7* 72.7* 75.4* 83.1*  NEUTROABS 9.1*  --   --   --  5.3 6.2 6.6  HGB 8.1*  8.1*   < > 8.4* 8.4* 8.5* 8.1* 7.7*  HCT 26.3*   < > 27.2* 26.5* 27.6* 26.2* 24.8*  MCV 106.9*  106.9*  --   --  101.5* 104.9* 104.8* 105.5*  PLT 79*  79*  --   --  69* 74* 91* 92*   < > = values in this interval not displayed.   Recent Labs  Lab 10/19/19 0542 10/20/19 0858 10/20/19 1348 10/21/19 0614  10/22/19 0449 10/23/19 0351  NA 140 141  --  142 144  143 139  143  K 3.6 4.2  --  4.5 3.9  4.0 3.6  3.6  CL 108 110  --  110 112*  111 110  112*  CO2 21* 18*  --  15* 19*  19* 18*  18*  GLUCOSE 141* 217*  --  268* 221*  219* 210*  212*  BUN 70* 77*  --  82* 97*  93* 95*  99*  CREATININE 4.75* 5.36*  --  5.93* 6.81*  6.47* 6.84*  6.91*  CALCIUM 8.0* 7.8*  --  7.7* 7.5*  7.4* 7.0*  7.1*  MG  --  2.0  --   --   --   --   PHOS  --  4.5 4.5 4.6 5.3* 4.7*    Signed, Terrilee Croak, MD [facility]  Pager: [phone] (Secure Chat preferred). 10/23/2019

## 2019-10-23 NOTE — Progress Notes (Signed)
   Vital Signs MEWS/VS Documentation      10/22/2019 2114 10/23/2019 0117 10/23/2019 0517 10/23/2019 1158   MEWS Score:  1  0  0  2   MEWS Score Color:  Green  Green  Green  Yellow   Resp:  15  15  19   (!) 26   Pulse:  80  85  83  84   BP:  (!) 95/49  (!) 109/50  (!) 106/58  (!) 103/51   Temp:  97.9 F (36.6 C)  98.2 F (36.8 C)  98 F (36.7 C)  98 F (36.7 C)     Pt is in the Yellow MEWS d/t elevated RR of 26. Paged MD. No new orders placed at this time. Will continue to monitor closely.     Benjamin Watkins 10/23/2019,12:09 PM

## 2019-10-24 LAB — GLUCOSE, CAPILLARY
Glucose-Capillary: 214 mg/dL — ABNORMAL HIGH (ref 70–99)
Glucose-Capillary: 199 mg/dL — ABNORMAL HIGH (ref 70–99)
Glucose-Capillary: 200 mg/dL — ABNORMAL HIGH (ref 70–99)
Glucose-Capillary: 162 mg/dL — ABNORMAL HIGH (ref 70–99)

## 2019-10-24 LAB — CBC WITH DIFFERENTIAL/PLATELET
Abs Immature Granulocytes: 0.58 10*3/uL — ABNORMAL HIGH (ref 0.00–0.07)
Basophils Absolute: 0.3 10*3/uL — ABNORMAL HIGH (ref 0.0–0.1)
Basophils Relative: 0 %
Eosinophils Absolute: 0.9 10*3/uL — ABNORMAL HIGH (ref 0.0–0.5)
Eosinophils Relative: 1 %
HCT: 29.1 % — ABNORMAL LOW (ref 39.0–52.0)
Hemoglobin: 8.9 g/dL — ABNORMAL LOW (ref 13.0–17.0)
Immature Granulocytes: 1 %
Lymphocytes Relative: 75 %
Lymphs Abs: 75.3 10*3/uL — ABNORMAL HIGH (ref 0.7–4.0)
MCH: 32.4 pg (ref 26.0–34.0)
MCHC: 30.6 g/dL (ref 30.0–36.0)
MCV: 105.8 fL — ABNORMAL HIGH (ref 80.0–100.0)
Monocytes Absolute: 15.8 10*3/uL — ABNORMAL HIGH (ref 0.1–1.0)
Monocytes Relative: 16 %
Neutro Abs: 7.1 10*3/uL (ref 1.7–7.7)
Neutrophils Relative %: 7 %
Platelets: 118 10*3/uL — ABNORMAL LOW (ref 150–400)
RBC: 2.75 MIL/uL — ABNORMAL LOW (ref 4.22–5.81)
RDW: 20.2 % — ABNORMAL HIGH (ref 11.5–15.5)
WBC: 100 10*3/uL (ref 4.0–10.5)
nRBC: 0.1 % (ref 0.0–0.2)

## 2019-10-24 LAB — BASIC METABOLIC PANEL
Anion gap: 15 (ref 5–15)
BUN: 111 mg/dL — ABNORMAL HIGH (ref 8–23)
CO2: 16 mmol/L — ABNORMAL LOW (ref 22–32)
Calcium: 7.6 mg/dL — ABNORMAL LOW (ref 8.9–10.3)
Chloride: 111 mmol/L (ref 98–111)
Creatinine, Ser: 7.51 mg/dL — ABNORMAL HIGH (ref 0.61–1.24)
GFR calc Af Amer: 7 mL/min — ABNORMAL LOW (ref >60)
GFR calc non Af Amer: 6 mL/min — ABNORMAL LOW (ref >60)
Glucose, Bld: 228 mg/dL — ABNORMAL HIGH (ref 70–99)
Potassium: 4.6 mmol/L (ref 3.5–5.1)
Sodium: 142 mmol/L (ref 135–145)

## 2019-10-24 LAB — RENAL FUNCTION PANEL
Albumin: 3 g/dL — ABNORMAL LOW (ref 3.5–5.0)
Anion gap: 18 — ABNORMAL HIGH (ref 5–15)
BUN: 112 mg/dL — ABNORMAL HIGH (ref 8–23)
CO2: 15 mmol/L — ABNORMAL LOW (ref 22–32)
Calcium: 7.9 mg/dL — ABNORMAL LOW (ref 8.9–10.3)
Chloride: 112 mmol/L — ABNORMAL HIGH (ref 98–111)
Creatinine, Ser: 7.56 mg/dL — ABNORMAL HIGH (ref 0.61–1.24)
GFR calc Af Amer: 7 mL/min — ABNORMAL LOW (ref >60)
GFR calc non Af Amer: 6 mL/min — ABNORMAL LOW (ref >60)
Glucose, Bld: 225 mg/dL — ABNORMAL HIGH (ref 70–99)
Phosphorus: 5.8 mg/dL — ABNORMAL HIGH (ref 2.5–4.6)
Potassium: 4.9 mmol/L (ref 3.5–5.1)
Sodium: 145 mmol/L (ref 135–145)

## 2019-10-24 LAB — IMMUNOFIXATION, URINE

## 2019-10-24 MED ORDER — SODIUM CHLORIDE 0.45 % IV SOLN: INTRAVENOUS | Status: AC

## 2019-10-24 MED ORDER — SODIUM BICARBONATE 650 MG PO TABS
1300.0000 mg | ORAL_TABLET | Freq: Two times a day (BID) | ORAL | Status: DC
  Administered 2019-10-24 – 2019-10-25 (×4): 1300 mg via ORAL
  Filled 2019-10-24 (×4): qty 2

## 2019-10-24 MED ORDER — CEFAZOLIN SODIUM-DEXTROSE 2-4 GM/100ML-% IV SOLN
2.0000 g | INTRAVENOUS | Status: DC
  Filled 2019-10-24: qty 100

## 2019-10-24 MED ORDER — CHLORHEXIDINE GLUCONATE CLOTH 2 % EX PADS
6.0000 | MEDICATED_PAD | Freq: Every day | CUTANEOUS | Status: DC
  Administered 2019-10-25 – 2019-10-26 (×2): 6 via TOPICAL

## 2019-10-24 NOTE — Progress Notes (Signed)
New Admission Note:  Arrival Method: Via [facility] Mental Orientation: Alert and oriented x 3 Telemetry: Box 12  ST/SR Assessment: Completed Skin: warm and dry. Rash groin towards abdomen IV: NSL Pain: Denies Tubes: Foley  Safety Measures: Safety Fall Prevention Plan initiated.  Admission: Completed 5 M  Orientation: Patient has been orientated to the room, unit and the staff. Welcome booklet given.  Family: None  Orders have been reviewed and implemented. Will continue to monitor the patient. Call light has been placed within reach and bed alarm has been activated.   Sima Matas BSN, RN  Phone Number: [phone]

## 2019-10-24 NOTE — Progress Notes (Signed)
.  mew   10/24/19 0104 10/24/19 0142 10/24/19 0240  Vitals  Temp 98 F (36.7 C) 98.9 F (37.2 C) 98.6 F (37 C)  Temp Source Oral Oral Oral  BP 96/78 123/81 (!) 89/60  MAP (mmHg) 85  --  70  BP Location Right Arm Right Arm Right Arm  BP Method Automatic Automatic Automatic  Patient Position (if appropriate)  --  Lying Lying  Pulse Rate 83 (!) 107 89  Pulse Rate Source Dinamap Dinamap Dinamap  Resp (!) 29 (!) 24 (!) 25    10/24/19 0346  Vitals  Temp 98.9 F (37.2 C)  Temp Source Oral  BP (!) 98/50  MAP (mmHg) 65  BP Location Right Arm  BP Method Automatic  Patient Position (if appropriate) Lying  Pulse Rate 93  Pulse Rate Source Dinamap  Resp (!) 25  Pt is now in YELLOW MEWS due to BP and respirations. Will continue to monitor

## 2019-10-24 NOTE — Progress Notes (Signed)
Pt has just left 1519 via [facility] to go to [facility] where he will need dialysis started.Personal  Belongings from the room sent with him.

## 2019-10-24 NOTE — Care Management Important Message (Signed)
Important Message  Patient Details IM Letter given to Roque Lias SW Case Manager to present to the Patient Name: Benjamin Watkins MRN: [id number] Date of Birth: 07-20-1933   Medicare Important Message Given:  Yes     Kerin Salen 10/24/2019, 10:27 AM

## 2019-10-24 NOTE — Progress Notes (Signed)
   10/24/19 0009 10/24/19 0021 10/24/19 0040  Vitals  Temp 98.5 F (36.9 C) 98.5 F (36.9 C) 98.3 F (36.8 C)  Temp Source Oral Oral Oral  BP 107/81 95/66 (!) 83/62  MAP (mmHg) 89 77  --   BP Location Right Arm Right Arm  --   BP Method Automatic  --   --   Patient Position (if appropriate) Lying  --   --   Pulse Rate 69 (!) 113 (!) 118  Pulse Rate Source Dinamap Dinamap Dinamap  Resp (!) 27 (!) 25 (!) 27    10/24/19 0104  Vitals  Temp 98 F (36.7 C)  Temp Source Oral  BP 96/78  MAP (mmHg) 85  BP Location Right Arm  BP Method Automatic  Patient Position (if appropriate)  --   Pulse Rate 83  Pulse Rate Source Dinamap  Resp (!) 29

## 2019-10-24 NOTE — Progress Notes (Signed)
MEWS

## 2019-10-24 NOTE — Progress Notes (Signed)
Pt is in RED MEWS, due to elevated HR 113, decreased BP 95/66 and respirations 25. Text paged MD , no new orders at this time. Will continue to monitor closely

## 2019-10-24 NOTE — Progress Notes (Addendum)
Admit: 10/18/2019 LOS: 6  80M with AoCKD3 in setting of CLL and recent use of venetoclax  Subjective:  Patient with 450 mL uop over 3/28.  Patient has discussed possibility of needing short or maybe long-term dialysis with nephrology and he states that he is ok with starting dialysis.  Given some concern re: mental status I have spoken with his son (His wife has dementia)  Discussed risks/benefits/indications of dialysis with his son who consented to proceed.  Patient is normally "very sharp" per his son.  He is ambulatory around the house.  Review of systems Does not have much of an appetite No n/v Denies shortness of breath  Denies chest pain   03/28 0701 - 03/29 0700 In: -  Out: 450 [Urine:450]  Filed Weights   10/18/19 1153  Weight: 98.9 kg    Scheduled Meds: . sodium chloride   Intravenous Once  . aspirin EC  81 mg Oral Daily  . carvedilol  3.125 mg Oral BID WC  . Chlorhexidine Gluconate Cloth  6 each Topical Daily  . feeding supplement (ENSURE ENLIVE)  237 mL Oral BID BM  . feeding supplement (PRO-STAT SUGAR FREE 64)  30 mL Oral BID  . insulin aspart  0-15 Units Subcutaneous TID WC  . insulin aspart  0-5 Units Subcutaneous QHS  . insulin glargine  12 Units Subcutaneous Daily  . sodium chloride flush  10-40 mL Intracatheter Q12H   Continuous Infusions:  PRN Meds:.acetaminophen **OR** acetaminophen, famotidine, mirtazapine, ondansetron **OR** ondansetron (ZOFRAN) IV, sodium chloride flush  Current Labs: reviewed    Physical Exam:  Blood pressure 96/62, pulse 60, temperature 98.7 F (37.1 C), temperature source Oral, resp. rate (!) 28, height _0  (1.88 m), weight 98.9 kg, SpO2 100 %. Elderly male in bed NAD  HEENT - NCAT  Neck supple trachea midline Regular, normal S1-S2, no rub Lungs - Clear bilaterally Ext - No significant peripheral edema No asterixis GU foley catheter in place Neuro - patient is oriented to person and year and month. Takes a moment to come  up with location but does state hospital and states "[place]"  A 1. AoCKD3: Etiology unclear. Possible AIN given rash as well as ischemic insults with hypotension and concern for pre-renal insults.   [place] without obstruction.  Pyuria present and potentially patient has AIN (rash present at admission) perhaps to venetoclax but also possible is PPI induced AIN.  He and his wife live at home  - short-term dialysis, anticipating some recovery of GFR, would be reasonable. - Concern especially given his malignancy how well he would do with dialysis in the long-term.  - NPO after midnight for dialysis tomorrow after tunneled catheter placement  - have discussed with hospitalist and recommended transfer to [facility] for initiation of hemodialysis  - Change to renal diet  - he is off of PPI  - half-normal NS at 75/hr x 12 hours   2. CLL followed by Dr. [name], appears to have had limiting adverse effects to venetoclax.  Per oncology his CLL is not very aggressive and per oncology assessment was characterized as unlikely to be lifethreatening immediately.  Therapy is currently on hold.  He does have other treatment options per oncology  3. DM - per primary team  4. HTN - stop coreg with hypotension. Spoke with team.  Half- normal saline as above 5. Anemia, TCP, per Heme 6. AG met acidosis 2/2 - start oral bicarbonate   Recent Labs  Lab 10/22/19 0449 10/23/19 0351 10/24/19  0539  NA 144  143 139  143 145  142  K 3.9  4.0 3.6  3.6 4.9  4.6  CL 112*  111 110  112* 112*  111  CO2 19*  19* 18*  18* 15*  16*  GLUCOSE 221*  219* 210*  212* 225*  228*  BUN 97*  93* 95*  99* 112*  111*  CREATININE 6.81*  6.47* 6.84*  6.91* 7.56*  7.51*  CALCIUM 7.5*  7.4* 7.0*  7.1* 7.9*  7.6*  PHOS 5.3* 4.7* 5.8*   Recent Labs  Lab 10/22/19 0449 10/23/19 0351 10/24/19 0539  WBC 75.4* 83.1* 100.0*  NEUTROABS 6.2 6.6 7.1  HGB 8.1* 7.7* 8.9*  HCT 26.2* 24.8* 29.1*  MCV 104.8* 105.5* 105.8*  PLT 91*  92* 118*     Claudia Desanctis, MD 10/24/2019 1:31 PM

## 2019-10-24 NOTE — Progress Notes (Addendum)
PROGRESS NOTE  Benjamin Watkins  DOB: August 19, 1932  PCP: [name], MD OVZ:[id number]  DOA: 10/18/2019 Admitted From: Home  LOS: 6 days   Chief Complaint  Patient presents with  . Abnormal Lab   Brief narrative: Benjamin Watkins is a 86 y.o. male with PMH of CLL, HTN, DM2. Patient was sent to ED from oncology office for abnormal labs including creatinine of 4.58.  Patient follows up with Dr. [name] for CLL. He was seen on 3/8, underwent blood work hemoglobin was 8.5, creatinine was 2.59. One of the CLL medications by the name of venetoclax was held.  He was started on oral prednisone for treatment of rash. Patient was asked to drink plenty water but apparently he was not able to drink enough water because he remained sleepy most of the hours in the day.  Repeat blood work was obtained on 3/23. Hemoglobin was noted to be low at 8.1, creatinine elevated to 4.58.  Patient was admitted to hospitalist service for further evaluation management  Subjective: Patient was seen and examined this morning.  Pleasant elderly Caucasian male.  Looks progressively weak, dry and altered.  Not restless or agitated.  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 -Nephrology consultation appreciated. Suspect patient developed drug-induced kidney injury secondary to Venclexta, -patient was also on chlorthalidone, PPI. -Offending meds have been stopped. -Patient seems to be unfortunately heading towards requiring dialysis.  -On my evaluation, patient looks dehydrated with wrinkled, dry skin.  -Nephrology involved.  Defer to nephrology about any need of IV fluid.  Intake/Output Summary (Last 24 hours) at 10/24/2019 1328 Last data filed at 10/24/2019 1156 Gross per 24 hour  Intake 100 ml  Output 450 ml  Net -350 ml   Acute metabolic encephalopathy -Fluctuating mental  status.  Seems weak and confused this morning.  Likely secondary to a combination of uremia as well as hospital induced delirium. -Continue to monitor mental status.  Patient has Remeron as needed at bedtime.  CLL (chronic lymphocytic leukemia) -WBC was 82.5 on admission, progressively increasing above 100,000 today.  it was in 30-60 range in the past -I discussed with oncologist Dr. [name] this morning.  He states that patient's CLL is not immediately life-threatening.  His treatment can be held till other medical conditions are stabilized.  -Venclexta has been on hold.  Anemia -Hb 8.1 on presentation. 1 unit of PRBC was given on the day of admission. Hemoglobin remains between 8-9.   -Continue to monitor.  DM (diabetes mellitus), type 2  -Home meds include glimepiride, ReliOn 70/30 insulin, 70 units twice a day -Insulin requirement is currently low because of poor creatinine clearance. -Currently on Lantus 12 units daily as well as sliding scale insulin.  His blood sugar remains at 214 this morning.  He has poor oral intake and continues to have renal failure.  I would keep Lantus at 12 units for now.     HTN -Home meds include Coreg 25 mg twice daily and chlorthalidone.  -Continue Coreg at low-dose of 6.25 mg twice daily.  Continue to hold chlorthalidone -Continue to monitor blood pressure and heart rate.  Code Status:  Full code DVT prophylaxis:  Heparin Antimicrobials:  None Fluid: None Diet: Renal diet Mobility: Increase ambulation Family Communication:  Updated in regular intervals.  Updated by nephrology today. Discharge plan:  Anticipated date and disposition: Being transferred to [facility] for  initiation of hemodialysis Barriers: Worsening renal failure, in need of dialysis  Consultants:  Nephrology  Oncology on phone  Consultants:  Nephrology.  Antimicrobials: Anti-infectives (From admission, onward)   None        Code Status: Full Code   Diet Order              Diet NPO time specified  Diet effective midnight        Diet renal/carb modified with fluid restriction Diet-HS Snack? Nothing; Fluid restriction: 1800 mL Fluid; Room service appropriate? Yes; Fluid consistency: Thin  Diet effective now              Infusions:  . sodium chloride      Scheduled Meds: . sodium chloride   Intravenous Once  . aspirin EC  81 mg Oral Daily  . Chlorhexidine Gluconate Cloth  6 each Topical Daily  . feeding supplement (ENSURE ENLIVE)  237 mL Oral BID BM  . feeding supplement (PRO-STAT SUGAR FREE 64)  30 mL Oral BID  . insulin aspart  0-15 Units Subcutaneous TID WC  . insulin aspart  0-5 Units Subcutaneous QHS  . insulin glargine  12 Units Subcutaneous Daily  . sodium bicarbonate  1,300 mg Oral BID  . sodium chloride flush  10-40 mL Intracatheter Q12H    PRN meds: acetaminophen **OR** acetaminophen, famotidine, mirtazapine, ondansetron **OR** ondansetron (ZOFRAN) IV, sodium chloride flush   Objective: Vitals:   10/24/19 0521 10/24/19 0918  BP: 96/62   Pulse: 62 60  Resp: (!) 24 (!) 28  Temp: 98.7 F (37.1 C)   SpO2: 100%     Intake/Output Summary (Last 24 hours) at 10/24/2019 1328 Last data filed at 10/24/2019 1156 Gross per 24 hour  Intake 100 ml  Output 450 ml  Net -350 ml   Filed Weights   10/18/19 1153  Weight: 98.9 kg   Weight change:  Body mass index is 27.99 kg/m.   Physical Exam: General exam: Progressively looking weak and dry Skin: No ulcers, bleeding. Scaly skin HEENT: Atraumatic, normocephalic, supple neck, no obvious bleeding Lungs: Clear to auscultation bilaterally CVS: Regular rate and rhythm, no murmur GI/Abd soft, nontender, nondistended, bowel sound present CNS: Slow to respond.  Somnolent this morning.  Mumbling.  Opens eyes on verbal command. psychiatry: Depressed look Extremities: Likely chronic pedal edema trace bilaterally but also has skin puckering probably recent dehydration.  Data Review: I  have personally reviewed the laboratory data and studies available.  Recent Labs  Lab 10/18/19 1013 10/18/19 2038 10/19/19 0542 10/20/19 0858 10/22/19 0449 10/23/19 0351 10/24/19 0539  WBC 82.5*  82.5*  --  74.7* 72.7* 75.4* 83.1* 100.0*  NEUTROABS 9.1*  --   --  5.3 6.2 6.6 7.1  HGB 8.1*  8.1*   < > 8.4* 8.5* 8.1* 7.7* 8.9*  HCT 26.3*   < > 26.5* 27.6* 26.2* 24.8* 29.1*  MCV 106.9*  106.9*  --  101.5* 104.9* 104.8* 105.5* 105.8*  PLT 79*  79*  --  69* 74* 91* 92* 118*   < > = values in this interval not displayed.   Recent Labs  Lab 10/20/19 0858 10/20/19 0858 10/20/19 1348 10/21/19 0614 10/22/19 0449 10/23/19 0351 10/24/19 0539  NA 141  --   --  142 144  143 139  143 145  142  K 4.2  --   --  4.5 3.9  4.0 3.6  3.6 4.9  4.6  CL 110  --   --  110  112*  111 110  112* 112*  111  CO2 18*  --   --  15* 19*  19* 18*  18* 15*  16*  GLUCOSE 217*  --   --  268* 221*  219* 210*  212* 225*  228*  BUN 77*  --   --  82* 97*  93* 95*  99* 112*  111*  CREATININE 5.36*  --   --  5.93* 6.81*  6.47* 6.84*  6.91* 7.56*  7.51*  CALCIUM 7.8*  --   --  7.7* 7.5*  7.4* 7.0*  7.1* 7.9*  7.6*  MG 2.0  --   --   --   --   --   --   PHOS 4.5   < > 4.5 4.6 5.3* 4.7* 5.8*   < > = values in this interval not displayed.    Signed, Terrilee Croak, MD [facility] Pager: [phone] (Secure Chat preferred) 10/24/2019   Addendum 1:30 pm: Nephrologist Dr. Royce Macadamia discussed with patient and family.  Plan is to move ahead with dialysis. We will transfer him to [facility].  To remain n.p.o. after midnight for HD catheter placement tomorrow. Because of hypotension, I held Coreg.

## 2019-10-24 NOTE — Consult Note (Signed)
Chief Complaint: Patient was seen in consultation today for tunneled hemodialysis catheter placement Chief Complaint  Patient presents with  . Abnormal Lab    Referring Physician(s): Katheren Puller  Supervising Physician: Jacqulynn Cadet  Patient Status: [facility] - In-pt  History of Present Illness: Benjamin Watkins is an 86 y.o. male with past medical history significant for CLL, diabetes, diverticulosis, hypertension and now with anemia,  acute on chronic kidney disease of uncertain etiology.  Latest creatinine 7.51.  Recent ultrasound shows normal size of both kidneys without hydronephrosis, 1.3 cm right renal cyst and mild prostatic enlargement.  Request received from nephrology for tunneled HD catheter placement.  Past Medical History:  Diagnosis Date  . CLL (chronic lymphocytic leukemia) ([place])   . Diabetes mellitus without complication ([place])   . Diverticulosis   . History of colon polyps   . Hypertension   . Low back pain   . Right foot drop     Past Surgical History:  Procedure Laterality Date  . CHOLECYSTECTOMY     laparoscopic  . COLONOSCOPY WITH PROPOFOL N/A 12/10/2015   Procedure: COLONOSCOPY WITH PROPOFOL;  Surgeon: Garlan Fair, MD;  Location: [facility] ENDOSCOPY;  Service: Endoscopy;  Laterality: N/A;  . EYE SURGERY Bilateral    bleeding retina  . TONSILLECTOMY      Allergies: Hydrochlorothiazide and Lisinopril  Medications: Prior to Admission medications   Medication Sig Start Date End Date Taking? Authorizing Provider  acetaminophen (TYLENOL) 325 MG tablet Take 650 mg by mouth as needed for mild pain.    Yes [provider]  aspirin EC 81 MG tablet Take 81 mg by mouth daily.   Yes [provider]  carvedilol (COREG) 25 MG tablet Take 25 mg by mouth 2 (two) times daily with a meal.   Yes [provider]  chlorthalidone (HYGROTON) 25 MG tablet Take 1 tablet (25 mg total) by mouth every other day. 10/18/19  Yes Brunetta Genera, MD   Cyanocobalamin (VITAMIN B-12) 1000 MCG SUBL PLACE 1  UNDER THE TONGUE ONCE DAILY Patient taking differently: Take 1,000 mcg by mouth daily.  10/03/19  Yes Brunetta Genera, MD  glimepiride (AMARYL) 2 MG tablet Take 2 mg by mouth daily with breakfast.   Yes [provider]  niacin 500 MG tablet Take 500 mg by mouth at bedtime.   Yes [provider]  NOVOLIN 70/30 RELION (70-30) 100 UNIT/ML injection Inject 70-74 Units into the skin 2 (two) times daily. Takes 74 units in the morning and 70 units at night. 08/27/15  Yes [provider]  omeprazole (PRILOSEC) 20 MG capsule Take 20 mg by mouth daily. 09/05/15  Yes [provider]  Polyethyl Glycol-Propyl Glycol (SYSTANE ULTRA OP) Place 2 drops into both eyes daily as needed (For dry eyes.).   Yes [provider]  venetoclax 100 MG TABS Take 200 mg by mouth daily. 09/26/19   Brunetta Genera, MD     Family History  Problem Relation Age of Onset  . Stroke Mother   . Diabetes Mother   . Other Father        "old age"    Social History   Socioeconomic History  . Marital status: Married    Spouse name: Not on file  . Number of children: 1  . Years of education: some college  . Highest education level: Not on file  Occupational History  . Occupation: Retired  Tobacco Use  . Smoking status: Never Smoker  . Smokeless  tobacco: Never Used  Substance and Sexual Activity  . Alcohol use: No  . Drug use: No  . Sexual activity: Not on file  Other Topics Concern  . Not on file  Social History Narrative   Lives at home with his wife.    Right-handed.    0.5 cup of coffee each morning, some tea.   Social Determinants of Health   Financial Resource Strain:   . Difficulty of Paying Living Expenses:   Food Insecurity:   . Worried About Charity fundraiser in the Last Year:   . Arboriculturist in the Last Year:   Transportation Needs:   . Film/video editor (Medical):   Marland Kitchen Lack of  Transportation (Non-Medical):   Physical Activity:   . Days of Exercise per Week:   . Minutes of Exercise per Session:   Stress:   . Feeling of Stress :   Social Connections:   . Frequency of Communication with Friends and Family:   . Frequency of Social Gatherings with Friends and Family:   . Attends Religious Services:   . Active Member of Clubs or Organizations:   . Attends Archivist Meetings:   Marland Kitchen Marital Status:       Review of Systems currently denies fever, headache, chest pain, worsening dyspnea, cough, abdominal/back pain, nausea, vomiting or bleeding.  Vital Signs: BP (!) 103/58   Pulse (!) 104   Temp 97.6 F (36.4 C) (Oral)   Resp (!) 22   Ht 6\' 2"  (1.88 m)   Wt 218 lb (98.9 kg)   SpO2 100%   BMI 27.99 kg/m   Physical Exam awake, does answer some simple questions appropriately but has had some fluctuating mental status.  Chest clear to auscultation bilaterally.  Heart with regular rate /rhythm. Abdomen soft, positive bowel sounds, nontender.  Trace to 1+ pretibial edema bilaterally.  Imaging: US RENAL  Result Date: 10/20/2019 CLINICAL DATA:  Acute renal failure. EXAM: RENAL / URINARY TRACT ULTRASOUND COMPLETE COMPARISON:  None. FINDINGS: Right Kidney: Renal measurements: 12.5 x 5.8 x 7.1 cm = volume: 272 mL . Echogenicity within normal limits. No mass or hydronephrosis visualized. 1.3 cm cyst over the upper pole. Left Kidney: Renal measurements: 0.5 x 5.9 x 6.5 cm = volume: 253 mL. Echogenicity within normal limits. No mass or hydronephrosis visualized. Bladder: Appears normal for degree of bladder distention. Bilateral ureteral jets visualized. Other: Mild prominence of the prostate gland measuring 5.8 x 5.0 x 3.6 cm = volume 54.8 cubic cm. IMPRESSION: 1.  Normal size kidneys without hydronephrosis. 2.  1.3 cm right renal cyst. 3.  Mild prostatic enlargement. Electronically Signed   By: Marin Olp M.D.   On: 10/20/2019 15:37    Labs:  CBC: Recent  Labs    10/20/19 0858 10/22/19 0449 10/23/19 0351 10/24/19 0539  WBC 72.7* 75.4* 83.1* 100.0*  HGB 8.5* 8.1* 7.7* 8.9*  HCT 27.6* 26.2* 24.8* 29.1*  PLT 74* 91* 92* 118*    COAGS: Recent Labs    10/19/19 0542  INR 1.5*    BMP: Recent Labs    10/21/19 0614 10/22/19 0449 10/23/19 0351 10/24/19 0539  NA 142 144  143 139  143 145  142  K 4.5 3.9  4.0 3.6  3.6 4.9  4.6  CL 110 112*  111 110  112* 112*  111  CO2 15* 19*  19* 18*  18* 15*  16*  GLUCOSE 268* 221*  219* 210*  212*  225*  228*  BUN 82* 97*  93* 95*  99* 112*  111*  CALCIUM 7.7* 7.5*  7.4* 7.0*  7.1* 7.9*  7.6*  CREATININE 5.93* 6.81*  6.47* 6.84*  6.91* 7.56*  7.51*  GFRNONAA 8* 7*  7* 7*  7* 6*  6*  GFRAA 9* 8*  8* 8*  8* 7*  7*    LIVER FUNCTION TESTS: Recent Labs    09/26/19 1215 09/26/19 1215 09/29/19 1230 09/29/19 1230 10/03/19 0759 10/03/19 0759 10/18/19 1013 10/18/19 1013 10/21/19 0614 10/22/19 0449 10/23/19 0351 10/24/19 0539  BILITOT 0.6  --  0.8  --  0.9  --  0.8  --   --   --   --   --   AST 20  --  17  --  23  --  13*  --   --   --   --   --   ALT 29  --  23  --  24  --  13  --   --   --   --   --   ALKPHOS 150*  --  165*  --  148*  --  131*  --   --   --   --   --   PROT 5.8*  --  5.9*  --  5.6*  --  5.0*  --   --   --   --   --   ALBUMIN 3.6   < > 3.7   < > 3.6   < > 3.2*   < > 2.9* 2.9* 2.7* 3.0*   < > = values in this interval not displayed.    TUMOR MARKERS: No results for input(s): AFPTM, CEA, CA199, CHROMGRNA in the last 8760 hours.  Assessment and Plan:  86 y.o. male with past medical history significant for CLL, diabetes, diverticulosis, hypertension and now with anemia,  acute on chronic kidney disease of uncertain etiology.  Latest creatinine 7.51.  Recent ultrasound shows normal size of both kidneys without hydronephrosis, 1.3 cm right renal cyst and mild prostatic enlargement.  Request received from nephrology for tunneled HD catheter  placement.  Details/risks of procedure, including but not limited to, internal bleeding, infection, injury to adjacent structures discussed with patient/son with their understanding and consent.  Patient awaiting transfer to [facility] for catheter placement and initiation of hemodialysis.    Thank you for this interesting consult.  I greatly enjoyed meeting Benjamin Watkins and look forward to participating in their care.  A copy of this report was sent to the requesting provider on this date.  Electronically Signed: D. Rowe Robert, PA-C 10/24/2019, 4:09 PM   I spent a total of  25 minutes   in face to face in clinical consultation, greater than 50% of which was counseling/coordinating care for tunneled hemodialysis catheter placement

## 2019-10-24 NOTE — Progress Notes (Signed)
Pt pulled midline. IV team alerted

## 2019-10-24 NOTE — Progress Notes (Signed)
PT Cancellation Note  Patient Details Name: ARSHAD OBERHOLZER MRN: [id number] DOB: 1933/04/23   Cancelled Treatment:     Checked on twice.  Pt transferring to [facility].  Pt has been evaluated (see PT Evaluation)   Rica Koyanagi  PTA Acute  Rehabilitation Services Pager      [phone] Office      [phone]

## 2019-10-25 ENCOUNTER — Inpatient Hospital Stay (HOSPITAL_COMMUNITY): Payer: Medicare Other

## 2019-10-25 HISTORY — PX: IR US GUIDE VASC ACCESS RIGHT: IMG2390

## 2019-10-25 HISTORY — PX: IR FLUORO GUIDE CV LINE RIGHT: IMG2283

## 2019-10-25 LAB — GLUCOSE, CAPILLARY
Glucose-Capillary: 149 mg/dL — ABNORMAL HIGH (ref 70–99)
Glucose-Capillary: 166 mg/dL — ABNORMAL HIGH (ref 70–99)
Glucose-Capillary: 212 mg/dL — ABNORMAL HIGH (ref 70–99)
Glucose-Capillary: 155 mg/dL — ABNORMAL HIGH (ref 70–99)

## 2019-10-25 MED ORDER — CEFAZOLIN SODIUM-DEXTROSE 2-4 GM/100ML-% IV SOLN
INTRAVENOUS | Status: AC
  Filled 2019-10-25: qty 100

## 2019-10-25 MED ORDER — MIDAZOLAM HCL 2 MG/2ML IJ SOLN
INTRAMUSCULAR | Status: AC
  Filled 2019-10-25: qty 2

## 2019-10-25 MED ORDER — LIDOCAINE HCL 1 % IJ SOLN
INTRAMUSCULAR | Status: AC | PRN
  Administered 2019-10-25: 5 mL

## 2019-10-25 MED ORDER — LABETALOL HCL 5 MG/ML IV SOLN
5.0000 mg | INTRAVENOUS | Status: DC | PRN
  Administered 2019-10-25: 5 mg via INTRAVENOUS
  Filled 2019-10-25: qty 4

## 2019-10-25 MED ORDER — LIDOCAINE-PRILOCAINE 2.5-2.5 % EX CREA
1.0000 "application " | TOPICAL_CREAM | CUTANEOUS | Status: DC | PRN
  Filled 2019-10-25: qty 5

## 2019-10-25 MED ORDER — HEPARIN SODIUM (PORCINE) 1000 UNIT/ML DIALYSIS: 1000.0000 [IU] | INTRAMUSCULAR | Status: DC | PRN

## 2019-10-25 MED ORDER — LACTATED RINGERS IV BOLUS: 500.0000 mL | Freq: Once | INTRAVENOUS | Status: DC

## 2019-10-25 MED ORDER — HEPARIN SODIUM (PORCINE) 1000 UNIT/ML IJ SOLN
INTRAMUSCULAR | Status: AC
  Filled 2019-10-25: qty 1

## 2019-10-25 MED ORDER — SODIUM CHLORIDE 0.9 % IV SOLN: 100.0000 mL | INTRAVENOUS | Status: DC | PRN

## 2019-10-25 MED ORDER — GELATIN ABSORBABLE 12-7 MM EX MISC
CUTANEOUS | Status: AC
  Filled 2019-10-25: qty 1

## 2019-10-25 MED ORDER — SODIUM CHLORIDE 0.9 % IV BOLUS
250.0000 mL | Freq: Once | INTRAVENOUS | Status: AC
  Administered 2019-10-25: 250 mL via INTRAVENOUS

## 2019-10-25 MED ORDER — FENTANYL CITRATE (PF) 100 MCG/2ML IJ SOLN
INTRAMUSCULAR | Status: AC | PRN
  Administered 2019-10-25: 25 ug via INTRAVENOUS

## 2019-10-25 MED ORDER — FENTANYL CITRATE (PF) 100 MCG/2ML IJ SOLN
INTRAMUSCULAR | Status: AC
  Filled 2019-10-25: qty 2

## 2019-10-25 MED ORDER — HEPARIN (PORCINE) 25000 UT/250ML-% IV SOLN
1650.0000 [IU]/h | INTRAVENOUS | Status: DC
  Administered 2019-10-25: 20:00:00 1450 [IU]/h via INTRAVENOUS
  Administered 2019-10-26 – 2019-10-29 (×5): 1650 [IU]/h via INTRAVENOUS
  Filled 2019-10-25 (×6): qty 250

## 2019-10-25 MED ORDER — PENTAFLUOROPROP-TETRAFLUOROETH EX AERO: 1.0000 "application " | INHALATION_SPRAY | CUTANEOUS | Status: DC | PRN

## 2019-10-25 MED ORDER — HEPARIN SODIUM (PORCINE) 1000 UNIT/ML IJ SOLN
INTRAMUSCULAR | Status: AC | PRN
  Administered 2019-10-25: 3200 [IU] via INTRAVENOUS

## 2019-10-25 MED ORDER — ALTEPLASE 2 MG IJ SOLR: 2.0000 mg | Freq: Once | INTRAMUSCULAR | Status: DC | PRN

## 2019-10-25 MED ORDER — MIDAZOLAM HCL 2 MG/2ML IJ SOLN
INTRAMUSCULAR | Status: AC | PRN
  Administered 2019-10-25 (×2): 0.5 mg via INTRAVENOUS

## 2019-10-25 MED ORDER — AMIODARONE LOAD VIA INFUSION
150.0000 mg | Freq: Once | INTRAVENOUS | Status: AC
  Filled 2019-10-25: qty 83.34

## 2019-10-25 MED ORDER — LIDOCAINE HCL 1 % IJ SOLN
INTRAMUSCULAR | Status: AC
  Filled 2019-10-25: qty 20

## 2019-10-25 MED ORDER — LIDOCAINE HCL (PF) 1 % IJ SOLN: 5.0000 mL | INTRAMUSCULAR | Status: DC | PRN

## 2019-10-25 MED ORDER — AMIODARONE HCL IN DEXTROSE 360-4.14 MG/200ML-% IV SOLN
INTRAVENOUS | Status: AC
  Administered 2019-10-25: 19:00:00 150 mg via INTRAVENOUS
  Filled 2019-10-25: qty 200

## 2019-10-25 MED ORDER — CEFAZOLIN SODIUM-DEXTROSE 1-4 GM/50ML-% IV SOLN
INTRAVENOUS | Status: AC | PRN
  Administered 2019-10-25: 2 g via INTRAVENOUS

## 2019-10-25 NOTE — Progress Notes (Signed)
PROGRESS NOTE    Benjamin Watkins  WJX:[id number] DOB: 1933-04-08 DOA: 10/18/2019 PCP: [name], MD    Brief Narrative:  86 y.o.malewith PMH of CLL, HTN, DM2. Patient was sent to ED from oncology office for abnormal labs including creatinine of 4.58.  Patient follows up with Dr. [name] for CLL. He was seen on 3/8, underwent blood work hemoglobin was 8.5, creatinine was 2.59. One of the CLL medications by the name of venetoclax was held.  He was started on oral prednisone for treatment of rash. Patient was asked to drink plenty water but apparently he was not able to drink enough water because he remained sleepy most of the hours in the day.  Repeat blood work was obtained on 3/23. Hemoglobin was noted to be low at 8.1, creatinine elevated to 4.58.  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 -Nephrology following. Suspected drug-induced kidney injury secondary to [place], -patient was also on chlorthalidone, PPI. -Offending meds have been stopped. -Pt was transferred to [facility] to initiate HD.  -Pt s/p RIJ tunneled cath 3/30 by IR -Plan to initiate HD per Nephrology  Acute metabolic encephalopathy - Remains somewhat confused this AM -  Likely secondary to a combination of uremia as well as hospital induced delirium. -Continue to monitor mental status.  Patient has Remeron as needed at bedtime.  CLL (chronic lymphocytic leukemia) -WBC was 82.5 on admission, progressively increasing above 100,000 today.  it was in 30-60 range in the past -Dr. Pietro Cassis discussed with  oncologist Dr. [name].  He states that patient's CLL is not immediately life-threatening.  His treatment can be held till other medical conditions are stabilized.  -Venclexta has been on hold.  Anemia -Hb 8.1 on presentation. 1 unit of PRBC was given on the day of admission. Hemoglobin remains stable -repeat cbc in AM.  DM (diabetes mellitus), type 2  -Home meds include glimepiride, ReliOn 70/30 insulin, 70 units twice a day -Insulin requirement is currently low because of poor creatinine clearance. -Currently on Lantus 12 units daily as well as sliding scale insulin.   -Glucose currently stable in the mid 140-160's  HTN -Home meds include Coreg 25 mg twice daily and chlorthalidone.  -Continue Coreg at low-dose of 6.25 mg twice daily.  Continue to hold chlorthalidone -bp stable at present  DVT prophylaxis: Heparin subq Code Status: Full Family Communication: Pt in room, family not at bedside Disposition Plan: From home, plan home health PT when OK to d/c per nephrology  Consultants:   Nephrology  IR  Procedures:   Tunneled HD cath placed by IR 3/30  Antimicrobials: Anti-infectives (From admission, onward)   Start     Dose/Rate Route Frequency Ordered Stop   10/25/19 1500  ceFAZolin (ANCEF) IVPB 2g/100 mL premix     2 g 200 mL/hr over 30 Minutes Intravenous To Radiology 10/24/19 1619 10/26/19 1500   10/25/19 1228  ceFAZolin (ANCEF) IVPB 1 g/50 mL premix     over 30 Minutes  Continuous PRN 10/25/19 1233 10/25/19 1228   10/25/19 1217  ceFAZolin (ANCEF) 2-4 GM/100ML-% IVPB  Note to Pharmacy: [name]   : cabinet override      10/25/19 1217 10/26/19 0029       Subjective: Pleasantly confused. Denies sob  Objective: Vitals:   10/25/19 1255 10/25/19 1300 10/25/19 1315 10/25/19 1345  BP: 109/66 (!) 109/52 (!) 107/45 (!) 104/54  Pulse: 94 97 94 93  Resp: 14 20 18  (!) 21  Temp:    (!) 97.5 F (36.4 C)  TempSrc:    Oral  SpO2: 98% 98% 96% 98%  Weight:        Height:        Intake/Output Summary (Last 24 hours) at 10/25/2019 1515 Last data filed at 10/25/2019 0900 Gross per 24 hour  Intake 1073.72 ml  Output 450 ml  Net 623.72 ml   Filed Weights   10/18/19 1153 10/24/19 2103  Weight: 98.9 kg 103.1 kg    Examination:  General exam: Appears calm and comfortable  Respiratory system: Clear to auscultation. Respiratory effort normal. Cardiovascular system: S1 & S2 heard, Regular Gastrointestinal system: Abdomen is nondistended, soft and nontender. No organomegaly or masses felt. Normal bowel sounds heard. Central nervous system: Alert somewhat confused. No focal neurological deficits. Extremities: Symmetric 5 x 5 power. Skin: No rashes, lesions Psychiatry: Judgement and insight appear normal. Mood & affect appropriate.   Data Reviewed: I have personally reviewed following labs and imaging studies  CBC: Recent Labs  Lab 10/19/19 0542 10/20/19 0858 10/22/19 0449 10/23/19 0351 10/24/19 0539  WBC 74.7* 72.7* 75.4* 83.1* 100.0*  NEUTROABS  --  5.3 6.2 6.6 7.1  HGB 8.4* 8.5* 8.1* 7.7* 8.9*  HCT 26.5* 27.6* 26.2* 24.8* 29.1*  MCV 101.5* 104.9* 104.8* 105.5* 105.8*  PLT 69* 74* 91* 92* 644*   Basic Metabolic Panel: Recent Labs  Lab 10/20/19 0858 10/20/19 0858 10/20/19 1348 10/21/19 0614 10/22/19 0449 10/23/19 0351 10/24/19 0539  NA 141  --   --  142 144  143 139  143 145  142  K 4.2  --   --  4.5 3.9  4.0 3.6  3.6 4.9  4.6  CL 110  --   --  110 112*  111 110  112* 112*  111  CO2 18*  --   --  15* 19*  19* 18*  18* 15*  16*  GLUCOSE 217*  --   --  268* 221*  219* 210*  212* 225*  228*  BUN 77*  --   --  82* 97*  93* 95*  99* 112*  111*  CREATININE 5.36*  --   --  5.93* 6.81*  6.47* 6.84*  6.91* 7.56*  7.51*  CALCIUM 7.8*  --   --  7.7* 7.5*  7.4* 7.0*  7.1* 7.9*  7.6*  MG 2.0  --   --   --   --   --   --   PHOS 4.5   < > 4.5 4.6 5.3* 4.7* 5.8*   < > = values in this interval not displayed.    GFR: Estimated Creatinine Clearance: 9.3 mL/min (A) (by C-G formula based on SCr of 7.56 mg/dL (H)). Liver Function Tests: Recent Labs  Lab 10/21/19 0614 10/22/19 0449 10/23/19 0351 10/24/19 0539  ALBUMIN 2.9* 2.9* 2.7* 3.0*   No results for input(s): LIPASE, AMYLASE in the last 168 hours. No results for input(s): AMMONIA in the last 168 hours. Coagulation Profile: Recent Labs  Lab 10/19/19 0542  INR 1.5*   Cardiac Enzymes: Recent Labs  Lab 10/20/19  1348  CKTOTAL 42*   BNP (last 3 results) No results for input(s): PROBNP in the last 8760 hours. HbA1C: No results for input(s): HGBA1C in the last 72 hours. CBG: Recent Labs  Lab 10/24/19 1221 10/24/19 1702 10/24/19 2032 10/25/19 0651 10/25/19 1103  GLUCAP 199* 200* 162* 149* 166*   Lipid Profile: No results for input(s): CHOL, HDL, LDLCALC, TRIG, CHOLHDL, LDLDIRECT in the last 72 hours. Thyroid Function Tests: No results for input(s): TSH, T4TOTAL, FREET4, T3FREE, THYROIDAB in the last 72 hours. Anemia Panel: No results for input(s): VITAMINB12, FOLATE, FERRITIN, TIBC, IRON, RETICCTPCT in the last 72 hours. Sepsis Labs: No results for input(s): PROCALCITON, LATICACIDVEN in the last 168 hours.  Recent Results (from the past 240 hour(s))  SARS CORONAVIRUS 2 (TAT 6-24 HRS) Nasopharyngeal Nasopharyngeal Swab     Status: None   Collection Time: 10/18/19  2:49 PM   Specimen: Nasopharyngeal Swab  Result Value Ref Range Status   SARS Coronavirus 2 NEGATIVE NEGATIVE Final    Comment: (NOTE) SARS-CoV-2 target nucleic acids are NOT DETECTED. The SARS-CoV-2 RNA is generally detectable in upper and lower respiratory specimens during the acute phase of infection. Negative results do not preclude SARS-CoV-2 infection, do not rule out co-infections with other pathogens, and should not be used as the sole basis for treatment or other patient management decisions. Negative results must be combined with clinical  observations, patient history, and epidemiological information. The expected result is Negative. Fact Sheet for Patients: SugarRoll.be Fact Sheet for Healthcare Providers: https://www.woods-mathews.com/ This test is not yet approved or cleared by the [place] FDA and  has been authorized for detection and/or diagnosis of SARS-CoV-2 by FDA under an Emergency Use Authorization (EUA). This EUA will remain  in effect (meaning this test can be used) for the duration of the COVID-19 declaration under Section 56 4(b)(1) of the Act, 21 U.S.C. section 360bbb-3(b)(1), unless the authorization is terminated or revoked sooner. Performed at [facility], [place] [address] [place], [place] [address]      Radiology Studies: No results found.  Scheduled Meds: . sodium chloride   Intravenous Once  . aspirin EC  81 mg Oral Daily  . Chlorhexidine Gluconate Cloth  6 each Topical Daily  . Chlorhexidine Gluconate Cloth  6 each Topical Q0600  . feeding supplement (ENSURE ENLIVE)  237 mL Oral BID BM  . feeding supplement (PRO-STAT SUGAR FREE 64)  30 mL Oral BID  . fentaNYL      . gelatin adsorbable      . heparin      . insulin aspart  0-15 Units Subcutaneous TID WC  . insulin aspart  0-5 Units Subcutaneous QHS  . insulin glargine  12 Units Subcutaneous Daily  . lidocaine      . midazolam      . sodium bicarbonate  1,300 mg Oral BID  . sodium chloride flush  10-40 mL Intracatheter Q12H   Continuous Infusions: . sodium chloride    . sodium chloride    . ceFAZolin    .  ceFAZolin (ANCEF) IV       LOS: 7 days   Marylu Lund, MD [facility] Pager On Amion  If 7PM-7AM, please contact night-coverage 10/25/2019, 3:15 PM

## 2019-10-25 NOTE — Plan of Care (Signed)
  Problem: Activity: Goal: Risk for activity intolerance will decrease Outcome: Progressing   

## 2019-10-25 NOTE — Progress Notes (Addendum)
ANTICOAGULATION CONSULT NOTE - Initial Consult  Pharmacy Consult for Heparin Indication: atrial fibrillation  Allergies  Allergen Reactions  . Hydrochlorothiazide Other (See Comments)    "It just bothers me"  . Lisinopril Other (See Comments)    "Takes away energy"    Patient Measurements: Height: 6\' 2"  (188 cm) Weight: 227 lb 4.7 oz (103.1 kg) IBW/kg (Calculated) : 82.2 Heparin Dosing Weight: 103 kg  Vital Signs: Temp: 97.7 F (36.5 C) (03/30 1806) Temp Source: Oral (03/30 1806) BP: 84/46 (03/30 1825) Pulse Rate: 132 (03/30 1825)  Labs: Recent Labs    10/23/19 0351 10/24/19 0539  HGB 7.7* 8.9*  HCT 24.8* 29.1*  PLT 92* 118*  CREATININE 6.84*  6.91* 7.56*  7.51*    Estimated Creatinine Clearance: 9.3 mL/min (A) (by C-G formula based on SCr of 7.56 mg/dL (H)).   Medical History: Past Medical History:  Diagnosis Date  . CLL (chronic lymphocytic leukemia) ([place])   . Diabetes mellitus without complication ([place])   . Diverticulosis   . History of colon polyps   . Hypertension   . Low back pain   . Right foot drop     Assessment: 86 yr old male admitted on 10/18/19 with AKI/dehydration, now with a fib with RVR, Pharmacy is consulted to dose heparin. Pt was on no anticoagulants PTA.   Nephrology plans to initiate HD. Patient had tunneled HD catheter placed in IR this afternoon (~6 hrs ago), but he pulled out catheter. Per RN, there is no bleeding at HD catheter 'removal' site. He is tentatively scheduled for image-guided tunneled HD catheter placement tomorrow in IR.  H/H (3/29) 8.9/29.1, platelets 118 (CBC stable)  Goal of Therapy:  Heparin level 0.3-0.7 units/ml Monitor platelets by anticoagulation protocol: Yes   Plan:  Start heparin infusion at 1450 units/hr (will omit bolus, due to IR procedure this afternoon) Check 8-hr heparin level Monitor daily heparin level, CBC Monitor for signs/symptoms of bleeding  Gillermina Hu, PharmD, BCPS, [facility] Clinical  Pharmacist 10/25/2019,6:55 PM

## 2019-10-25 NOTE — Progress Notes (Signed)
BP 80s/50s.  IV site red; amio bolus stopped.  Tylene Fantasia, NP notified.  IV team consult order placed for IV restart.  Order received to hold amio bolus and check manual BP.  Jodell Cipro

## 2019-10-25 NOTE — Progress Notes (Signed)
Patient came back from IR with HD cath intact. This RN checked on patient. Patient  was resting and was not seen touching the site.  On checking the patient again patient pulled catheter out and MD and IR PA was contacted about what took place.Rapid response RN was contacted at bedside. Vital signs obtained and stable  Interventional Radiology PA look at site and catheter. PA  stated that she would talk with Radiologist about what to do next. PA state that they will not be able to place HD cath until tomorrow.   Hemodialysis was inform of what took place with the patient.

## 2019-10-25 NOTE — Significant Event (Addendum)
Rapid Response Event Note  Overview: Time Called: 1758 Arrival Time: 1800 Event Type: Cardiac Called for pt having new onset atrial fibrillation with RVR. Confirmed on EKG. HR 115-150 bpm.   VS: T 97.38F, BP 103/60 (74), HR 129, RR 28, SpO2 98% on room air.   Initial Focused Assessment: Pt lying in bed, in no distress. Pt denies pain or shortness of breath. Pt warm, dry to touch. Lung sounds are clear. Pt alert and following commands. Abdomen is soft.   Following Labetalol administration pt HR sustaining 110-130 bpm. BP now 91/66 (75). Dr. Wyline Copas notified. Orders received for transfer to progressive, Amiodarone gtt and Heparin gtt. Pt remains in no distress.   Interventions: -CXR  -5mg  IV Labetalol  Plan of Care (if not transferred): -Transfer to progressive for Amiodarone gtt and Heparin gtt  Event Summary: Name of Physician Notified: Dr. Wyline Copas at [facility] Outcome: Transferred (Comment)(Transferred to progressive) Event End Time: [place]

## 2019-10-25 NOTE — Progress Notes (Signed)
2 new IVs placed by IV team.  IV heparin gtt initiated.  Manual BP is 86/54.  Tylene Fantasia, NP notified.  Order received for NS bolus.  Jodell Cipro

## 2019-10-25 NOTE — TOC Progression Note (Addendum)
Transition of Care [facility]) - Progression Note    Patient Details  Name: ALEXA GOLEBIEWSKI MRN: [id number] Date of Birth: Jun 16, 1933  [facility]) CM/SW Contact  Bartholomew Crews, RN Phone Number: [phone] 10/25/2019, 11:20 AM  Clinical Narrative:    Spoke with patient at the bedside. Revisited therapy recommendations for PT at home and RW. Patient now agreeable. Spoke with patient's wife and son, Fatima Sanger, on the phone who are in agreement with [place] and DME - RW. [place] referral accepted by [place] for RN, PT, OT. Patient will need HH orders with Face to Face at time of discharge and order for DME for RW.    Discussed current visitation restrictions. Discussed HD cath placement and HD today. Contact number for nurses station provided. Advised that patient is not ready for discharge, but NCM will follow along and assist with discharge planning.   TOC following for transition needs.    Expected Discharge Plan: [place] Barriers to Discharge: Continued Medical Work up  Expected Discharge Plan and Services Expected Discharge Plan: [place]                                               Social Determinants of Health (SDOH) Interventions    Readmission Risk Interventions No flowsheet data found.

## 2019-10-25 NOTE — Procedures (Signed)
Interventional Radiology Procedure Note  Procedure: Right IJ approach tunneled HD catheter, 19 cm.  Tips in the upper RA and ready for use.   Complications: None  Estimated Blood Loss: None  Recommendations: - Routine line care   Signed,  Criselda Peaches, MD

## 2019-10-25 NOTE — Progress Notes (Signed)
Shift event: RN paged that the Amiodorone drip had been started but pt's IV infiltrated. IV team had been called. The HR was 130s in AF with a BP in the 70s at that time. RN was asked to take the BP manually and call back to NP when IV restarted. After these actions, NP spoke to RN. IV team had obtained 2 PIVs. Heparin drip had been restarted. Saline bolus had been started as well. BP was back over 100 and HR was now SR in the 80s. Therefore, will not restart Amiodorone at this time. Asked RN to call back if pt sustains AF over 120 and will consider restart of med.  KJKG, NP [facility]

## 2019-10-25 NOTE — Progress Notes (Signed)
Patient went into afib with RVR. RN got vitals and notify rapid repondse and MD. MD put in orders and patient will be transferred [place].

## 2019-10-25 NOTE — Progress Notes (Signed)
  Amiodarone Drug - Drug Interaction Consult Note  Recommendations:  Monitor for AV block, myocardial depression, bradycardia while on labetalol Monitor for QTc prolongation while on ondansetron  Amiodarone is metabolized by the cytochrome P450 system and therefore has the potential to cause many drug interactions. Amiodarone has an average plasma half-life of 50 days (range 20 to 100 days).   There is potential for drug interactions to occur several weeks or months after stopping treatment and the onset of drug interactions may be slow after initiating amiodarone.   []  Statins: Increased risk of myopathy. Simvastatin- restrict dose to 20mg  daily. Other statins: counsel patients to report any muscle pain or weakness immediately.  []  Anticoagulants: Amiodarone can increase anticoagulant effect. Consider warfarin dose reduction. Patients should be monitored closely and the dose of anticoagulant altered accordingly, remembering that amiodarone levels take several weeks to stabilize.  []  Antiepileptics: Amiodarone can increase plasma concentration of phenytoin, the dose should be reduced. Note that small changes in phenytoin dose can result in large changes in levels. Monitor patient and counsel on signs of toxicity.  [x]  Beta blockers: increased risk of bradycardia, AV block and myocardial depression. Sotalol - avoid concomitant use.  []   Calcium channel blockers (diltiazem and verapamil): increased risk of bradycardia, AV block and myocardial depression.  []   Cyclosporine: Amiodarone increases levels of cyclosporine. Reduced dose of cyclosporine is recommended.  []  Digoxin dose should be halved when amiodarone is started.  []  Diuretics: increased risk of cardiotoxicity if hypokalemia occurs.  []  Oral hypoglycemic agents (glyburide, glipizide, glimepiride): increased risk of hypoglycemia. Patient's glucose levels should be monitored closely when initiating amiodarone therapy.   [x]  Drugs  that prolong the QT interval:  Torsades de pointes risk may be increased with concurrent use - avoid if possible.  Monitor QTc, also keep magnesium/potassium WNL if concurrent therapy can't be avoided. Marland Kitchen Antibiotics: e.g. fluoroquinolones, erythromycin. . Antiarrhythmics: e.g. quinidine, procainamide, disopyramide, sotalol. . Antipsychotics: e.g. phenothiazines, haloperidol.  . Lithium, tricyclic antidepressants, and methadone.    Ondansetron  Gillermina Hu, PharmD, BCPS, [facility] Clinical Pharmacist 10/25/2019 7:31 PM

## 2019-10-25 NOTE — Progress Notes (Signed)
Patient belonging was sent home with wife.

## 2019-10-25 NOTE — Progress Notes (Signed)
Patient arrived at 1915hrs.  HR in 130s with BP 84/61.  Will start amiodarone gtt at this time.

## 2019-10-25 NOTE — Significant Event (Signed)
Rapid Response Event Note  Overview: Time Called: 1531 Arrival Time: 1535 Event Type: Other (Comment)(Pt removed his HD catheter) Primary RN walked in to pt's room to find his sheets saturated with blood and noted HD catheter to have been removed by pt.   Initial Focused Assessment: Pt lying in bed, pleasantly confused. Right IJ HD catheter is completely out, although sutures are still intact. HD catheter tip does not have clean edges, concern that catheter broke as pt self removed it. With assistance of A. Louk, PA sutures were removed and occlusive gauze dressing placed over catheter insertion site. Site no longer bleeding upon my arrival. Pt is in no respiratory distress. Pt is confused at baseline, appropriately moving all extremities and following commands.   Interventions: A. Louk, PA to bedside to assess HD catheter. Per PA the catheter was intact, therefore there is no concern that part of the catheter became dislodged during removal.   Plan of Care (if not transferred): -Continuous pulse oximetry and cardiac monitoring overnight, or as specified by provider -Maintain occlusive dressing over HD catheter insertion site -Monitor pt for neurologic changes  Call rapid response for further needs.  Event Summary: Name of Physician Notified: Dr. Wyline Copas at [place] Name of Consulting Physician Notified: A. Louk, PA-C at 1531 Outcome: Stayed in room and stabalized  Event End Time: [place]

## 2019-10-25 NOTE — Progress Notes (Signed)
Received call from Rapid Response of new afib RVR. Obtained and reviewed EKG, confirms rapid afib. Given trial of IV labetalol without improvement. Given soft BP, will opt for initiating amiodarone gtt and transfer to SDU. CHASD-VASc of at least 4 based on age, HTN, DM thus would benefit from anticoagulation. Will start heparin gtt, which could be stopped temporarily if undergoing future procedures.

## 2019-10-25 NOTE — Sedation Documentation (Signed)
RN on the floor made aware that the patient is stating the area where the HD catheter was placed is itchy and appears to be picking at the area.  The band aid that was applied to the area above the HD catheter was replaced prior to the patient being discharged to the floor due to the patient pulling it off.  Patient is alert prior to going to the floor but is under the influence of medications administered during the HD Cath procedure.

## 2019-10-25 NOTE — Progress Notes (Signed)
[facility] ROUNDING NOTE   Subjective:   This is an 86-year gentleman with a history of CLL with acute on chronic kidney disease stage III.  He was admitted from the oncology office for abnormal labs including a creatinine of 4.58.  On 10/03/2019 blood work showed a creatinine of 2.59.  At that time he also had a rash having recently had venetoclax for treatment of his CLL.  He was started on oral steroids.  In addition to this the patient was also on chlorthalidone and PPI all of which could have contributed to the rash.  Is acute on chronic kidney failure was thought to be secondary to either acute interstitial nephritis or acute tubular necrosis.  He was transferred from [facility] 10/24/2019 to [facility] for initiation of dialysis.  It was thought that his fluctuating mental status was secondary to uremia.  He is scheduled for placement of a tunneled hemodialysis catheter 10/25/2019  Blood pressure 110/47 pulse 93 temperature 98 O2 sats 96% room air  Sodium 145 potassium 4.9 chloride 112 CO2 15 glucose 225 BUN 112 creatinine 7.56 calcium 7.9 phosphorus 5.8 albumin 3.0 WBC is 100 hemoglobin 8.9  Aspirin 81 mg daily, insulin sliding scale, sodium bicarbonate 1.3 g twice daily,   Objective:  Vital signs in last 24 hours:  Temp:  [97.6 F (36.4 C)-98.5 F (36.9 C)] 98 F (36.7 C) (03/30 0855) Pulse Rate:  [59-104] 93 (03/30 0855) Resp:  [15-25] 20 (03/30 0855) BP: (97-111)/(47-65) 110/47 (03/30 0855) SpO2:  [96 %-100 %] 96 % (03/30 0855) Weight:  [103.1 kg] 103.1 kg (03/29 2103)  Weight change:  Filed Weights   10/18/19 1153 10/24/19 2103  Weight: 98.9 kg 103.1 kg    Intake/Output: I/O last 3 completed shifts: In: 1266.2 [P.O.:220; I.V.:1046.2] Out: 650 [Urine:650]   Intake/Output this shift:  No intake/output data recorded.  Elderly male nondistressed  CVS- RRR no rubs RS-clear bilaterally ABD- BS present soft non-distended Foley  catheter EXT- no edema   Basic Metabolic Panel: Recent Labs  Lab 10/20/19 0858 10/20/19 0858 10/20/19 1348 10/21/19 0614 10/21/19 0614 10/22/19 0449 10/23/19 0351 10/24/19 0539  NA 141  --   --  142  --  144  143 139  143 145  142  K 4.2  --   --  4.5  --  3.9  4.0 3.6  3.6 4.9  4.6  CL 110  --   --  110  --  112*  111 110  112* 112*  111  CO2 18*  --   --  15*  --  19*  19* 18*  18* 15*  16*  GLUCOSE 217*  --   --  268*  --  221*  219* 210*  212* 225*  228*  BUN 77*  --   --  82*  --  97*  93* 95*  99* 112*  111*  CREATININE 5.36*  --   --  5.93*  --  6.81*  6.47* 6.84*  6.91* 7.56*  7.51*  CALCIUM 7.8*   < >  --  7.7*   < > 7.5*  7.4* 7.0*  7.1* 7.9*  7.6*  MG 2.0  --   --   --   --   --   --   --   PHOS 4.5   < > 4.5 4.6  --  5.3* 4.7* 5.8*   < > = values in this interval not displayed.  Liver Function Tests: Recent Labs  Lab 10/18/19 1013 10/21/19 0614 10/22/19 0449 10/23/19 0351 10/24/19 0539  AST 13*  --   --   --   --   ALT 13  --   --   --   --   ALKPHOS 131*  --   --   --   --   BILITOT 0.8  --   --   --   --   PROT 5.0*  --   --   --   --   ALBUMIN 3.2* 2.9* 2.9* 2.7* 3.0*   No results for input(s): LIPASE, AMYLASE in the last 168 hours. No results for input(s): AMMONIA in the last 168 hours.  CBC: Recent Labs  Lab 10/18/19 1013 10/18/19 2038 10/19/19 0542 10/20/19 0858 10/22/19 0449 10/23/19 0351 10/24/19 0539  WBC 82.5*  82.5*  --  74.7* 72.7* 75.4* 83.1* 100.0*  NEUTROABS 9.1*  --   --  5.3 6.2 6.6 7.1  HGB 8.1*  8.1*   < > 8.4* 8.5* 8.1* 7.7* 8.9*  HCT 26.3*   < > 26.5* 27.6* 26.2* 24.8* 29.1*  MCV 106.9*  106.9*  --  101.5* 104.9* 104.8* 105.5* 105.8*  PLT 79*  79*  --  69* 74* 91* 92* 118*   < > = values in this interval not displayed.    Cardiac Enzymes: Recent Labs  Lab 10/20/19 1348  CKTOTAL 42*    BNP: Invalid input(s): POCBNP  CBG: Recent Labs  Lab 10/24/19 0822 10/24/19 1221  10/24/19 1702 10/24/19 2032 10/25/19 0651  GLUCAP 214* 199* 200* 162* 149*    Microbiology: Results for orders placed or performed during the hospital encounter of 10/18/19  SARS CORONAVIRUS 2 (TAT 6-24 HRS) Nasopharyngeal Nasopharyngeal Swab     Status: None   Collection Time: 10/18/19  2:49 PM   Specimen: Nasopharyngeal Swab  Result Value Ref Range Status   SARS Coronavirus 2 NEGATIVE NEGATIVE Final    Comment: (NOTE) SARS-CoV-2 target nucleic acids are NOT DETECTED. The SARS-CoV-2 RNA is generally detectable in upper and lower respiratory specimens during the acute phase of infection. Negative results do not preclude SARS-CoV-2 infection, do not rule out co-infections with other pathogens, and should not be used as the sole basis for treatment or other patient management decisions. Negative results must be combined with clinical observations, patient history, and epidemiological information. The expected result is Negative. Fact Sheet for Patients: SugarRoll.be Fact Sheet for Healthcare Providers: https://www.woods-mathews.com/ This test is not yet approved or cleared by the [place] FDA and  has been authorized for detection and/or diagnosis of SARS-CoV-2 by FDA under an Emergency Use Authorization (EUA). This EUA will remain  in effect (meaning this test can be used) for the duration of the COVID-19 declaration under Section 56 4(b)(1) of the Act, 21 U.S.C. section 360bbb-3(b)(1), unless the authorization is terminated or revoked sooner. Performed at [facility], [place] [address] [place], [place] [address]     Coagulation Studies: No results for input(s): LABPROT, INR in the last 72 hours.  Urinalysis: No results for input(s): COLORURINE, LABSPEC, PHURINE, GLUCOSEU, HGBUR, BILIRUBINUR, KETONESUR, PROTEINUR, UROBILINOGEN, NITRITE, LEUKOCYTESUR in the last 72 hours.  Invalid input(s): APPERANCEUR    Imaging: No  results found.   Medications:   .  ceFAZolin (ANCEF) IV     . sodium chloride   Intravenous Once  . aspirin EC  81 mg Oral Daily  . Chlorhexidine Gluconate Cloth  6 each Topical Daily  .  Chlorhexidine Gluconate Cloth  6 each Topical Q0600  . feeding supplement (ENSURE ENLIVE)  237 mL Oral BID BM  . feeding supplement (PRO-STAT SUGAR FREE 64)  30 mL Oral BID  . insulin aspart  0-15 Units Subcutaneous TID WC  . insulin aspart  0-5 Units Subcutaneous QHS  . insulin glargine  12 Units Subcutaneous Daily  . sodium bicarbonate  1,300 mg Oral BID  . sodium chloride flush  10-40 mL Intracatheter Q12H   acetaminophen **OR** acetaminophen, famotidine, mirtazapine, ondansetron **OR** ondansetron (ZOFRAN) IV, sodium chloride flush  Assessment/ Plan:   Acute on chronic kidney disease.  Etiology unclear ultrasound without obstruction possible acute interstitial nephritis in view of rash.  Short-term dialysis anticipating some recovery of GFR.  Patient with fairly pronounced azotemia.  IV fluids discontinued.  CLL follow-up with Dr. [name].  Would continue with close follow-up with oncology.  Hypertension.  Now with some hypotension discontinued antihypertensive medications  Metabolic acidosis or bicarbonate this started patient will be dialyzed.  Will discontinue sodium bicarbonate after dialysis.  Anemia followed by heme appreciate assistance   LOS: [place] @TODAY @9 :21 AM

## 2019-10-25 NOTE — Progress Notes (Signed)
Physical Therapy Treatment Patient Details Name: JHOVANI GRISWOLD MRN: [id number] DOB: Aug 15, 1932 Today's Date: 10/25/2019    History of Present Illness 86 y.o. male with medical history of CLL, HTN, DM2 who presents from the oncology office for abnormal labs.  Pt admitted for acute kidney injury and dehydration    PT Comments    Pt presenting with increased confusion vs on PT eval, and with more difficulty mobilizing. Pt required min assist for all mobility with max verbal cuing to safely participate and understand mobility tasks. Pt with x1 posterior LOB during initial transfer to standing, requiring PT assist to correct. Pt ambulated short distance with use of RW, limited by fatigue-related trembling and dyspnea on exertion, sats WFL. PT updated plan to reflect SNF vs HHPT, as this PT is unsure of how much pt's wife will be able to physically assist him at home and pt with increasing confusion. PT to continue to follow acutely.    Follow Up Recommendations  Home health PT;SNF;Supervision for mobility/OOB     Equipment Recommendations  Rolling walker with 5" wheels    Recommendations for Other Services       Precautions / Restrictions Precautions Precautions: Fall Precaution Comments: hx R foot drop Restrictions Weight Bearing Restrictions: No    Mobility  Bed Mobility Overal bed mobility: Needs Assistance Bed Mobility: Supine to Sit;Sit to Supine     Supine to sit: HOB elevated;Min assist Sit to supine: Min assist;HOB elevated   General bed mobility comments: min assist for trunk and LE management, very increased time and difficulty sequencing task.  Transfers Overall transfer level: Needs assistance Equipment used: Rolling walker (2 wheeled) Transfers: Sit to/from Stand Sit to Stand: Min assist         General transfer comment: min assist for power up, steadying. Pt with posterior LOB in first 5 seconds of standing requiring PT righting to  correct.  Ambulation/Gait Ambulation/Gait assistance: Min assist Gait Distance (Feet): 30 Feet Assistive device: Rolling walker (2 wheeled) Gait Pattern/deviations: Decreased dorsiflexion - right;Step-through pattern;Decreased stride length;Trunk flexed Gait velocity: decr   General Gait Details: Min assist for steadying, max verbal cuing for placement in RW and upright posture. PT also cuing for increased R hip and knee flexion to increase R foot clearance.   Stairs             Wheelchair Mobility    Modified Rankin (Stroke Patients Only)       Balance Overall balance assessment: Needs assistance Sitting-balance support: No upper extremity supported;Feet supported Sitting balance-Leahy Scale: Fair     Standing balance support: Bilateral upper extremity supported Standing balance-Leahy Scale: Poor Standing balance comment: reliant on UE support                            Cognition Arousal/Alertness: Awake/alert Behavior During Therapy: WFL for tasks assessed/performed Overall Cognitive Status: Impaired/Different from baseline Area of Impairment: Orientation;Following commands;Safety/judgement;Problem solving                 Orientation Level: Disoriented to;Time;Situation     Following Commands: Follows one step commands consistently Safety/Judgement: Decreased awareness of safety   Problem Solving: Slow processing;Difficulty sequencing;Requires verbal cues;Requires tactile cues General Comments: Pt oriented to self, location, and year, but states it is "september or october". Difficulty with command following and sequencing tasks, requires repeated cuing to participate.      Exercises      General Comments General comments (skin  integrity, edema, etc.): HRmax 105 bpm, RRmax 30 breaths/min with difficulty recovering, SpO2 98% post-ambulation      Pertinent Vitals/Pain Pain Assessment: No/denies pain    Home Living                       Prior Function            PT Goals (current goals can now be found in the care plan section) Acute Rehab PT Goals PT Goal Formulation: With patient/family Time For Goal Achievement: 10/26/19 Potential to Achieve Goals: Good Progress towards PT goals: Not progressing toward goals - comment(confused, increased difficulty mobilizing)    Frequency    Min 3X/week      PT Plan Discharge plan needs to be updated    Co-evaluation              AM-PAC PT "6 Clicks" Mobility   Outcome Measure  Help needed turning from your back to your side while in a flat bed without using bedrails?: A Little Help needed moving from lying on your back to sitting on the side of a flat bed without using bedrails?: A Little Help needed moving to and from a bed to a chair (including a wheelchair)?: A Little Help needed standing up from a chair using your arms (e.g., wheelchair or bedside chair)?: A Little Help needed to walk in hospital room?: A Little Help needed climbing 3-5 steps with a railing? : A Lot 6 Click Score: 17    End of Session Equipment Utilized During Treatment: Gait belt Activity Tolerance: Patient tolerated treatment well Patient left: with call bell/phone within reach;in bed;with bed alarm set Nurse Communication: Mobility status PT Visit Diagnosis: Difficulty in walking, not elsewhere classified (R26.2);Muscle weakness (generalized) (M62.81)     Time: 2575-0518 PT Time Calculation (min) (ACUTE ONLY): 19 min  Charges:  $Gait Training: 8-22 mins                     Katiya Fike E, PT [place] Pager [phone]  Office [phone]   Dontrail Blackwell D [place] 10/25/2019, 1:22 PM

## 2019-10-25 NOTE — Progress Notes (Signed)
PA paged to bedside for patient having pulled his dialysis catheter placed earlier today.  Patient reportedly confused, found with catheter removed, sutures remaining in place.   RN at bedside holding pressure. Hemostasis achieved.  Sutrures removed. Catheter intact, non-fractured.   Dressing applied.   Per unit RN, patient has not yet received dialysis.   IR available for re-attempt at catheter placement tomorrow if still warranted by medical team. Will need to consider if catheter replacement appropriate given patient would have had to remove the overlying dressing and pulled the catheter completely out.    If team would like replacement, please place order and make patient NPO p MN.  Brynda Greathouse, MS RD PA-C

## 2019-10-25 NOTE — Progress Notes (Signed)
Patient Status: [facility] - In-pt  Assessment and Plan: Patient in need of HD access.  Patient had tunneled HD catheter placed in IR this afternoon, and unfortunately was found to have pulled it out. Catheter removed intact. Nephrology was consulted who recommends replacement of tunneled HD catheter tomorrow- plan for image-guided tunneled HD catheter placement tentatively for tomorrow in IR. Patient will be NPO at midnight. Afebrile.  Risks and benefits discussed with the patient including, but not limited to bleeding, infection, vascular injury, pneumothorax which may require chest tube placement, air embolism or even death. All of the patient's questions were answered, patient is agreeable to proceed. Consent obtained by patient's son, Benjamin Watkins, via telephone- signed and in IR control room.  ______________________________________________________________________   History of Present Illness: Benjamin Watkins is a 86 y.o. male with a past medical history of hypertension, diverticulosis, CKD, diabetes mellitus, CLL, and anemia. He has been admitted to [facility] since 10/18/2019 for management of AKI and anemia. Nephrology was consulted on admission who recommends initiation of dialysis at this time, along with IR consultation for possible tunneled HD catheter placement.  Patient was seen and consented for procedure. Tunneled HD catheter was placed in IR today by Dr. Laurence Ferrari, and unfortunately patient found to have pulled his catheter out.   PA assessed bedside- patient found to have catheter dislodged and held to skin with sutures with RN holding pressure. Patient appears confused. Catheter and sutures were removed intact. Pressure held until hemostasis achieved. Dressing applied to site.   Allergies and medications reviewed.   Review of Systems: A 12 point ROS discussed and pertinent positives are indicated in the HPI above.  All other systems are negative.  Review of Systems  Unable  to perform ROS: Mental status change    Vital Signs: BP (!) 116/57   Pulse 97   Temp 98.1 F (36.7 C) (Oral)   Resp (!) 21   Ht 6\' 2"  (1.88 m)   Wt 227 lb 4.7 oz (103.1 kg)   SpO2 97%   BMI 29.18 kg/m   Physical Exam Vitals and nursing note reviewed.  Constitutional:      General: He is not in acute distress. Cardiovascular:     Comments: Right IJ with HD catheter dislodged and held on by sutures, this was removed and manual pressure was held until hemostasis achieved, dressing applied to site. Pulmonary:     Effort: Pulmonary effort is normal. No respiratory distress.  Skin:    General: Skin is warm and dry.  Neurological:     Mental Status: He is alert.      Imaging reviewed.   Labs:  COAGS: Recent Labs    10/19/19 0542  INR 1.5*    BMP: Recent Labs    10/21/19 0614 10/22/19 0449 10/23/19 0351 10/24/19 0539  NA 142 144  143 139  143 145  142  K 4.5 3.9  4.0 3.6  3.6 4.9  4.6  CL 110 112*  111 110  112* 112*  111  CO2 15* 19*  19* 18*  18* 15*  16*  GLUCOSE 268* 221*  219* 210*  212* 225*  228*  BUN 82* 97*  93* 95*  99* 112*  111*  CALCIUM 7.7* 7.5*  7.4* 7.0*  7.1* 7.9*  7.6*  CREATININE 5.93* 6.81*  6.47* 6.84*  6.91* 7.56*  7.51*  GFRNONAA 8* 7*  7* 7*  7* 6*  6*  GFRAA 9* 8*  8* 8*  8*  7*  7*       Electronically Signed: Earley Abide, PA-C 10/25/2019, 4:15 PM   I spent a total of 15 minutes in face to face in clinical consultation, greater than 50% of which was counseling/coordinating care for venous access.

## 2019-10-26 ENCOUNTER — Inpatient Hospital Stay (HOSPITAL_COMMUNITY): Payer: Medicare Other

## 2019-10-26 DIAGNOSIS — N179 Acute kidney failure, unspecified: Principal | ICD-10-CM

## 2019-10-26 DIAGNOSIS — E440 Moderate protein-calorie malnutrition: Secondary | ICD-10-CM | POA: Insufficient documentation

## 2019-10-26 DIAGNOSIS — I5043 Acute on chronic combined systolic (congestive) and diastolic (congestive) heart failure: Secondary | ICD-10-CM

## 2019-10-26 DIAGNOSIS — N189 Chronic kidney disease, unspecified: Secondary | ICD-10-CM

## 2019-10-26 DIAGNOSIS — I480 Paroxysmal atrial fibrillation: Secondary | ICD-10-CM

## 2019-10-26 DIAGNOSIS — I340 Nonrheumatic mitral (valve) insufficiency: Secondary | ICD-10-CM

## 2019-10-26 HISTORY — PX: IR US GUIDE VASC ACCESS RIGHT: IMG2390

## 2019-10-26 HISTORY — PX: IR FLUORO GUIDE CV LINE RIGHT: IMG2283

## 2019-10-26 LAB — RENAL FUNCTION PANEL
Albumin: 2.5 g/dL — ABNORMAL LOW (ref 3.5–5.0)
Albumin: 2.8 g/dL — ABNORMAL LOW (ref 3.5–5.0)
Anion gap: 15 (ref 5–15)
Anion gap: 18 — ABNORMAL HIGH (ref 5–15)
BUN: 141 mg/dL — ABNORMAL HIGH (ref 8–23)
BUN: 146 mg/dL — ABNORMAL HIGH (ref 8–23)
CO2: 15 mmol/L — ABNORMAL LOW (ref 22–32)
CO2: 17 mmol/L — ABNORMAL LOW (ref 22–32)
Calcium: 7 mg/dL — ABNORMAL LOW (ref 8.9–10.3)
Calcium: 7.4 mg/dL — ABNORMAL LOW (ref 8.9–10.3)
Chloride: 112 mmol/L — ABNORMAL HIGH (ref 98–111)
Chloride: 112 mmol/L — ABNORMAL HIGH (ref 98–111)
Creatinine, Ser: 8.3 mg/dL — ABNORMAL HIGH (ref 0.61–1.24)
Creatinine, Ser: 8.58 mg/dL — ABNORMAL HIGH (ref 0.61–1.24)
GFR calc Af Amer: 6 mL/min — ABNORMAL LOW (ref >60)
GFR calc Af Amer: 6 mL/min — ABNORMAL LOW (ref >60)
GFR calc non Af Amer: 5 mL/min — ABNORMAL LOW (ref >60)
GFR calc non Af Amer: 5 mL/min — ABNORMAL LOW (ref >60)
Glucose, Bld: 171 mg/dL — ABNORMAL HIGH (ref 70–99)
Glucose, Bld: 242 mg/dL — ABNORMAL HIGH (ref 70–99)
Phosphorus: 6.6 mg/dL — ABNORMAL HIGH (ref 2.5–4.6)
Phosphorus: 7.2 mg/dL — ABNORMAL HIGH (ref 2.5–4.6)
Potassium: 6.5 mmol/L (ref 3.5–5.1)
Potassium: 6.2 mmol/L — ABNORMAL HIGH (ref 3.5–5.1)
Sodium: 142 mmol/L (ref 135–145)
Sodium: 147 mmol/L — ABNORMAL HIGH (ref 135–145)

## 2019-10-26 LAB — HEPATITIS B SURFACE ANTIBODY,QUALITATIVE: Hep B S Ab: NONREACTIVE

## 2019-10-26 LAB — GLUCOSE, CAPILLARY
Glucose-Capillary: 175 mg/dL — ABNORMAL HIGH (ref 70–99)
Glucose-Capillary: 192 mg/dL — ABNORMAL HIGH (ref 70–99)
Glucose-Capillary: 199 mg/dL — ABNORMAL HIGH (ref 70–99)
Glucose-Capillary: 144 mg/dL — ABNORMAL HIGH (ref 70–99)

## 2019-10-26 LAB — CBC
HCT: 26 % — ABNORMAL LOW (ref 39.0–52.0)
Hemoglobin: 8 g/dL — ABNORMAL LOW (ref 13.0–17.0)
MCH: 31.4 pg (ref 26.0–34.0)
MCHC: 30.8 g/dL (ref 30.0–36.0)
MCV: 102 fL — ABNORMAL HIGH (ref 80.0–100.0)
Platelets: 114 10*3/uL — ABNORMAL LOW (ref 150–400)
RBC: 2.55 MIL/uL — ABNORMAL LOW (ref 4.22–5.81)
RDW: 20 % — ABNORMAL HIGH (ref 11.5–15.5)
WBC: 135.4 10*3/uL (ref 4.0–10.5)
nRBC: 0.1 % (ref 0.0–0.2)

## 2019-10-26 LAB — BASIC METABOLIC PANEL
Anion gap: 17 — ABNORMAL HIGH (ref 5–15)
BUN: 149 mg/dL — ABNORMAL HIGH (ref 8–23)
CO2: 18 mmol/L — ABNORMAL LOW (ref 22–32)
Calcium: 7.1 mg/dL — ABNORMAL LOW (ref 8.9–10.3)
Chloride: 113 mmol/L — ABNORMAL HIGH (ref 98–111)
Creatinine, Ser: 8.61 mg/dL — ABNORMAL HIGH (ref 0.61–1.24)
GFR calc Af Amer: 6 mL/min — ABNORMAL LOW (ref >60)
GFR calc non Af Amer: 5 mL/min — ABNORMAL LOW (ref >60)
Glucose, Bld: 223 mg/dL — ABNORMAL HIGH (ref 70–99)
Potassium: 6.6 mmol/L (ref 3.5–5.1)
Sodium: 148 mmol/L — ABNORMAL HIGH (ref 135–145)

## 2019-10-26 LAB — MAGNESIUM: Magnesium: 2.1 mg/dL (ref 1.7–2.4)

## 2019-10-26 LAB — HEPARIN LEVEL (UNFRACTIONATED)
Heparin Unfractionated: 0.18 IU/mL — ABNORMAL LOW (ref 0.30–0.70)
Heparin Unfractionated: 0.44 IU/mL (ref 0.30–0.70)

## 2019-10-26 LAB — HEPATITIS B SURFACE ANTIGEN: Hepatitis B Surface Ag: NONREACTIVE

## 2019-10-26 LAB — PATHOLOGIST SMEAR REVIEW

## 2019-10-26 LAB — ECHOCARDIOGRAM COMPLETE
Height: 74 in
Weight: 3555.58 oz

## 2019-10-26 LAB — HEPATITIS B CORE ANTIBODY, TOTAL: Hep B Core Total Ab: NONREACTIVE

## 2019-10-26 MED ORDER — LIDOCAINE HCL (PF) 1 % IJ SOLN
INTRAMUSCULAR | Status: AC | PRN
  Administered 2019-10-26: 10 mL

## 2019-10-26 MED ORDER — AMIODARONE HCL IN DEXTROSE 360-4.14 MG/200ML-% IV SOLN
60.0000 mg/h | INTRAVENOUS | Status: DC
  Administered 2019-10-26 (×2): 60 mg/h via INTRAVENOUS
  Filled 2019-10-26: qty 200

## 2019-10-26 MED ORDER — MIDAZOLAM HCL 2 MG/2ML IJ SOLN
INTRAMUSCULAR | Status: AC
  Filled 2019-10-26: qty 2

## 2019-10-26 MED ORDER — CEFAZOLIN SODIUM-DEXTROSE 2-4 GM/100ML-% IV SOLN
INTRAVENOUS | Status: AC
  Filled 2019-10-26: qty 100

## 2019-10-26 MED ORDER — DIGOXIN 0.25 MG/ML IJ SOLN: 0.2500 mg | Freq: Once | INTRAMUSCULAR | Status: DC

## 2019-10-26 MED ORDER — FENTANYL CITRATE (PF) 100 MCG/2ML IJ SOLN
INTRAMUSCULAR | Status: AC
  Filled 2019-10-26: qty 2

## 2019-10-26 MED ORDER — AMIODARONE LOAD VIA INFUSION
150.0000 mg | Freq: Once | INTRAVENOUS | Status: AC
  Administered 2019-10-26: 150 mg via INTRAVENOUS
  Filled 2019-10-26: qty 83.34

## 2019-10-26 MED ORDER — PRO-STAT SUGAR FREE PO LIQD
30.0000 mL | Freq: Two times a day (BID) | ORAL | Status: DC
  Administered 2019-10-27 – 2019-10-30 (×7): 30 mL
  Filled 2019-10-26 (×8): qty 30

## 2019-10-26 MED ORDER — SODIUM CHLORIDE 0.9 % IV BOLUS
250.0000 mL | Freq: Once | INTRAVENOUS | Status: AC
  Administered 2019-10-26: 250 mL via INTRAVENOUS

## 2019-10-26 MED ORDER — STERILE WATER FOR INJECTION IV SOLN
INTRAVENOUS | Status: DC
  Filled 2019-10-26 (×4): qty 850

## 2019-10-26 MED ORDER — FENTANYL CITRATE (PF) 100 MCG/2ML IJ SOLN
INTRAMUSCULAR | Status: AC | PRN
  Administered 2019-10-26: 25 ug via INTRAVENOUS

## 2019-10-26 MED ORDER — LIDOCAINE HCL 1 % IJ SOLN
INTRAMUSCULAR | Status: AC
  Filled 2019-10-26: qty 20

## 2019-10-26 MED ORDER — HEPARIN SODIUM (PORCINE) 1000 UNIT/ML IJ SOLN
INTRAMUSCULAR | Status: AC
  Filled 2019-10-26: qty 1

## 2019-10-26 MED ORDER — SODIUM POLYSTYRENE SULFONATE 15 GM/60ML PO SUSP
45.0000 g | Freq: Once | ORAL | Status: AC
  Administered 2019-10-26: 45 g via ORAL
  Filled 2019-10-26: qty 180

## 2019-10-26 MED ORDER — MIDAZOLAM HCL 2 MG/2ML IJ SOLN
INTRAMUSCULAR | Status: AC | PRN
  Administered 2019-10-26: 0.5 mg via INTRAVENOUS

## 2019-10-26 MED ORDER — PERFLUTREN LIPID MICROSPHERE
1.0000 mL | INTRAVENOUS | Status: AC | PRN
  Filled 2019-10-26: qty 10

## 2019-10-26 MED ORDER — CHLORHEXIDINE GLUCONATE CLOTH 2 % EX PADS
6.0000 | MEDICATED_PAD | Freq: Every day | CUTANEOUS | Status: DC
  Administered 2019-10-27 – 2019-10-28 (×2): 6 via TOPICAL

## 2019-10-26 MED ORDER — DIGOXIN 0.25 MG/ML IJ SOLN
0.1250 mg | Freq: Once | INTRAMUSCULAR | Status: AC
  Administered 2019-10-26: 0.125 mg via INTRAVENOUS
  Filled 2019-10-26: qty 2

## 2019-10-26 MED ORDER — AMIODARONE HCL IN DEXTROSE 360-4.14 MG/200ML-% IV SOLN
30.0000 mg/h | INTRAVENOUS | Status: DC
  Administered 2019-10-27 – 2019-10-31 (×9): 30 mg/h via INTRAVENOUS
  Filled 2019-10-26 (×9): qty 200

## 2019-10-26 MED ORDER — OSMOLITE 1.2 CAL PO LIQD: 1000.0000 mL | ORAL | Status: DC

## 2019-10-26 MED ORDER — SODIUM ZIRCONIUM CYCLOSILICATE 10 G PO PACK
10.0000 g | PACK | Freq: Three times a day (TID) | ORAL | Status: DC
  Administered 2019-10-26 (×2): 10 g via ORAL
  Filled 2019-10-26 (×4): qty 1

## 2019-10-26 MED ORDER — CALCIUM GLUCONATE-NACL 1-0.675 GM/50ML-% IV SOLN: 1.0000 g | Freq: Once | INTRAVENOUS | Status: DC

## 2019-10-26 MED ORDER — CALCIUM GLUCONATE-NACL 1-0.675 GM/50ML-% IV SOLN
1.0000 g | INTRAVENOUS | Status: AC
  Administered 2019-10-27: 1000 mg via INTRAVENOUS
  Filled 2019-10-26 (×2): qty 50

## 2019-10-26 MED ORDER — OSMOLITE 1.5 CAL PO LIQD
1000.0000 mL | ORAL | Status: DC
  Administered 2019-10-27 – 2019-10-28 (×3): 1000 mL
  Filled 2019-10-26 (×10): qty 1000

## 2019-10-26 NOTE — Plan of Care (Addendum)
Labs returned with worsening hyperkalemia.  Have ordered dialysis for tonight and called the HD unit RN.  Patient with tunneled catheter in place.  I evaluated the patient and blood pressure and HR have improved.  Spoke with cardiology and he states to proceed with HD now from an afib standpoint.    They are starting amio and I have ordered calcium.  Follow BMP after HD.   Claudia Desanctis 10/26/2019 7:36 PM

## 2019-10-26 NOTE — Consult Note (Signed)
Cardiology Consultation:   Patient ID: Benjamin Watkins MRN: [id number]; DOB: [birth year]  Admit date: 10/18/2019 Date of Consult: 10/26/2019  Primary Care Provider: [name], MD Primary Cardiologist: new, Toluwanimi Radebaugh  Primary Electrophysiologist:  None    Patient Profile:   Benjamin Watkins is a 86 y.o. male with a hx of hypertension, diabetes mellitus, chronic anemia, and chronic kidney disease who is being seen today for the evaluation of new onset atrial fibrillation at the request of Dr. Doristine Bosworth.  History of Present Illness:   Benjamin Watkins is an 86 year old gentleman with a history of hypertension, diabetes, chronic kidney disease.  He was sent to the emergency room yesterday from his oncologist for abnormal labs including an elevated creatinine of 4.58. He was having some fluctuating mental status so he was transferred from [facility] to [facility] with the thought of starting dialysis.  Yesterday afternoon he developed rapid atrial fibrillation.  He was started on amiodarone drip.  His blood pressure was fairly low which prohibited starting diltiazem drip.  He converted back into sinus rhythm.  Since that time his had some intermittent atrial fibrillation and we were consulted for further management.  Creatinine has increased and is 8.58 today.  His potassium is 6.2.  Glucose is 242. His net diuresis is 1.2 L so far during this admission.  He is confused this evening. Difficult to get any reasonable hx .  Has progressive renal dysfunction   Past Medical History:  Diagnosis Date  . CLL (chronic lymphocytic leukemia) ([place])   . Diabetes mellitus without complication ([place])   . Diverticulosis   . History of colon polyps   . Hypertension   . Low back pain   . Right foot drop     Past Surgical History:  Procedure Laterality Date  . CHOLECYSTECTOMY     laparoscopic  . COLONOSCOPY WITH PROPOFOL N/A 12/10/2015   Procedure: COLONOSCOPY WITH PROPOFOL;   Surgeon: Garlan Fair, MD;  Location: [facility] ENDOSCOPY;  Service: Endoscopy;  Laterality: N/A;  . EYE SURGERY Bilateral    bleeding retina  . IR FLUORO GUIDE CV LINE RIGHT  10/25/2019  . IR FLUORO GUIDE CV LINE RIGHT  10/26/2019  . IR US GUIDE VASC ACCESS RIGHT  10/25/2019  . IR US GUIDE VASC ACCESS RIGHT  10/26/2019  . TONSILLECTOMY       Home Medications:  Prior to Admission medications   Medication Sig Start Date End Date Taking? Authorizing Provider  acetaminophen (TYLENOL) 325 MG tablet Take 650 mg by mouth as needed for mild pain.    Yes [provider]  aspirin EC 81 MG tablet Take 81 mg by mouth daily.   Yes [provider]  carvedilol (COREG) 25 MG tablet Take 25 mg by mouth 2 (two) times daily with a meal.   Yes [provider]  chlorthalidone (HYGROTON) 25 MG tablet Take 1 tablet (25 mg total) by mouth every other day. 10/18/19  Yes Brunetta Genera, MD  Cyanocobalamin (VITAMIN B-12) 1000 MCG SUBL PLACE 1  UNDER THE TONGUE ONCE DAILY Patient taking differently: Take 1,000 mcg by mouth daily.  10/03/19  Yes Brunetta Genera, MD  glimepiride (AMARYL) 2 MG tablet Take 2 mg by mouth daily with breakfast.   Yes [provider]  niacin 500 MG tablet Take 500 mg by mouth at bedtime.   Yes [provider]  NOVOLIN 70/30 RELION (70-30) 100 UNIT/ML injection Inject 70-74 Units into the skin 2 (two)  times daily. Takes 74 units in the morning and 70 units at night. 08/27/15  Yes [provider]  omeprazole (PRILOSEC) 20 MG capsule Take 20 mg by mouth daily. 09/05/15  Yes [provider]  Polyethyl Glycol-Propyl Glycol (SYSTANE ULTRA OP) Place 2 drops into both eyes daily as needed (For dry eyes.).   Yes [provider]  venetoclax 100 MG TABS Take 200 mg by mouth daily. 09/26/19   Brunetta Genera, MD    Inpatient Medications: Scheduled Meds: . sodium chloride   Intravenous Once  . aspirin EC  81 mg Oral Daily  .  Chlorhexidine Gluconate Cloth  6 each Topical Daily  . Chlorhexidine Gluconate Cloth  6 each Topical Q0600  . digoxin  0.25 mg Intravenous Once  . feeding supplement (PRO-STAT SUGAR FREE 64)  30 mL Per Tube BID  . fentaNYL      . heparin      . insulin aspart  0-15 Units Subcutaneous TID WC  . insulin aspart  0-5 Units Subcutaneous QHS  . insulin glargine  12 Units Subcutaneous Daily  . lidocaine      . midazolam      . sodium chloride flush  10-40 mL Intracatheter Q12H  . sodium zirconium cyclosilicate  10 g Oral TID   Continuous Infusions: . sodium chloride    . sodium chloride    . ceFAZolin    . feeding supplement (OSMOLITE 1.5 CAL)    . heparin 1,650 Units/hr (10/26/19 1459)  .  sodium bicarbonate (isotonic) infusion in sterile water 100 mL/hr at 10/26/19 1200   PRN Meds: sodium chloride, sodium chloride, acetaminophen **OR** acetaminophen, alteplase, famotidine, labetalol, lidocaine (PF), lidocaine-prilocaine, mirtazapine, ondansetron **OR** ondansetron (ZOFRAN) IV, pentafluoroprop-tetrafluoroeth, sodium chloride flush  Allergies:    Allergies  Allergen Reactions  . Hydrochlorothiazide Other (See Comments)    "It just bothers me"  . Lisinopril Other (See Comments)    "Takes away energy"    Social History:   Social History   Socioeconomic History  . Marital status: Married    Spouse name: Not on file  . Number of children: 1  . Years of education: some college  . Highest education level: Not on file  Occupational History  . Occupation: Retired  Tobacco Use  . Smoking status: Never Smoker  . Smokeless tobacco: Never Used  Substance and Sexual Activity  . Alcohol use: No  . Drug use: No  . Sexual activity: Not on file  Other Topics Concern  . Not on file  Social History Narrative   Lives at home with his wife.    Right-handed.    0.5 cup of coffee each morning, some tea.   Social Determinants of Health   Financial Resource Strain:   . Difficulty of  Paying Living Expenses:   Food Insecurity:   . Worried About Charity fundraiser in the Last Year:   . Arboriculturist in the Last Year:   Transportation Needs:   . Film/video editor (Medical):   Marland Kitchen Lack of Transportation (Non-Medical):   Physical Activity:   . Days of Exercise per Week:   . Minutes of Exercise per Session:   Stress:   . Feeling of Stress :   Social Connections:   . Frequency of Communication with Friends and Family:   . Frequency of Social Gatherings with Friends and Family:   . Attends Religious Services:   . Active Member of Clubs or Organizations:   .  Attends Archivist Meetings:   Marland Kitchen Marital Status:   Intimate Partner Violence:   . Fear of Current or Ex-Partner:   . Emotionally Abused:   Marland Kitchen Physically Abused:   . Sexually Abused:     Family History:    Family History  Problem Relation Age of Onset  . Stroke Mother   . Diabetes Mother   . Other Father        "old age"     ROS:  Please see the history of present illness.   All other ROS reviewed and negative.     Physical Exam/Data:   Vitals:   10/26/19 1619 10/26/19 1648 10/26/19 1652 10/26/19 1658  BP:  (!) 56/29 (!) 85/60 (!) 71/38  Pulse:  (!) 162 79 (!) 124  Resp: (!) 21 (!) 22 (!) 24 (!) 22  Temp:    98.1 F (36.7 C)  TempSrc:    Axillary  SpO2:  95% 98% 99%  Weight:      Height:        Intake/Output Summary (Last 24 hours) at 10/26/2019 1729 Last data filed at 10/26/2019 1500 Gross per 24 hour  Intake 597.94 ml  Output 775 ml  Net -177.06 ml   Last 3 Weights 10/26/2019 10/24/2019 10/18/2019  Weight (lbs) 222 lb 3.6 oz 227 lb 4.7 oz 218 lb  Weight (kg) 100.8 kg 103.1 kg 98.884 kg     Body mass index is 28.53 kg/m.  General:  Elderly male,   Somewhat dissoriented  HEENT: normal Lymph: no adenopathy Neck: no JVD Endocrine:  No thryomegaly Vascular: No carotid bruits; FA pulses 2+ bilaterally without bruits  Cardiac: RR , soft systolic murmur  Lungs:  clear to  auscultation bilaterally, no wheezing, rhonchi or rales  Abd: soft, nontender, no hepatomegaly  Ext: no edema Musculoskeletal:  Chronic stasis changes.   Long curved toenails,   ? Not sure how ambulatory he has been .  He is unable to tell me Skin: warm and dry  Neuro:  CNs 2-12 intact, no focal abnormalities noted Psych:  Normal affect   EKG:  The EKG was personally reviewed and demonstrates:   NSR at 98. LBBB   Telemetry:  Telemetry was personally reviewed and demonstrates: NSR at 101     Relevant CV Studies:   Laboratory Data:  High Sensitivity Troponin:  No results for input(s): TROPONINIHS in the last 720 hours.   Chemistry Recent Labs  Lab 10/24/19 0539 10/26/19 0508 10/26/19 1436  NA 145  142 142 147*  K 4.9  4.6 6.5* 6.2*  CL 112*  111 112* 112*  CO2 15*  16* 15* 17*  GLUCOSE 225*  228* 171* 242*  BUN 112*  111* 141* 146*  CREATININE 7.56*  7.51* 8.30* 8.58*  CALCIUM 7.9*  7.6* 7.0* 7.4*  GFRNONAA 6*  6* 5* 5*  GFRAA 7*  7* 6* 6*  ANIONGAP 18*  15 15 18*    Recent Labs  Lab 10/24/19 0539 10/26/19 0508 10/26/19 1436  ALBUMIN 3.0* 2.5* 2.8*   Hematology Recent Labs  Lab 10/23/19 0351 10/24/19 0539 10/26/19 0508  WBC 83.1* 100.0* 135.4*  RBC 2.35* 2.75* 2.55*  HGB 7.7* 8.9* 8.0*  HCT 24.8* 29.1* 26.0*  MCV 105.5* 105.8* 102.0*  MCH 32.8 32.4 31.4  MCHC 31.0 30.6 30.8  RDW 20.1* 20.2* 20.0*  PLT 92* 118* 114*   BNPNo results for input(s): BNP, PROBNP in the last 168 hours.  DDimer No results for input(s):  DDIMER in the last 168 hours.   Radiology/Studies:  IR Fluoro Guide CV Line Right  Result Date: 10/26/2019 CLINICAL DATA:  Renal failure and status post placement of tunneled hemodialysis catheter yesterday with subsequent pulling and complete dislodgement of the catheter by the patient due to confusion and combativeness. He requires another catheter to be placed for emergent hemodialysis. EXAM: TUNNELED CENTRAL VENOUS HEMODIALYSIS  CATHETER PLACEMENT WITH ULTRASOUND AND FLUOROSCOPIC GUIDANCE ANESTHESIA/SEDATION: 0.5 mg IV Versed; 25 mcg IV Fentanyl. Total Moderate Sedation Time:   19 minutes. The patient's level of consciousness and physiologic status were continuously monitored during the procedure by Radiology nursing. MEDICATIONS: 2 g IV Ancef. FLUOROSCOPY TIME:  24 seconds.  3.2 mGy. PROCEDURE: The procedure, risks, benefits, and alternatives were explained to the patient. Questions regarding the procedure were encouraged and answered. The patient understands and consents to the procedure. A timeout was performed prior to initiating the procedure. The right neck and chest were prepped with chlorhexidine in a sterile fashion, and a sterile drape was applied covering the operative field. Maximum barrier sterile technique with sterile gowns and gloves were used for the procedure. Local anesthesia was provided with 1% lidocaine. Ultrasound was used to confirm patency of the right internal jugular vein. After creating a small venotomy incision, a 21 gauge needle was advanced into the right internal jugular vein under direct, real-time ultrasound guidance. Ultrasound image documentation was performed. After securing guidewire access, an 8 Fr dilator was placed. A J-wire was kinked to measure appropriate catheter length. A Palindrome tunneled hemodialysis catheter measuring 19 cm from tip to cuff was chosen for placement. This was tunneled in a retrograde fashion from the chest wall to the venotomy incision. At the venotomy, serial dilatation was performed and a 16 Fr peel-away sheath was placed over a guidewire. The catheter was then placed through the sheath and the sheath removed. Final catheter positioning was confirmed and documented with a fluoroscopic spot image. The catheter was aspirated, flushed with saline, and injected with appropriate volume heparin dwells. The venotomy incision was closed with subcutaneous subcuticular 4-0 Vicryl.  Dermabond was applied to the incision. The catheter exit site was secured with 0-Prolene retention sutures. COMPLICATIONS: None.  No pneumothorax. FINDINGS: After catheter placement, the tip lies in the right atrium. The catheter aspirates normally and is ready for immediate use. IMPRESSION: Placement of tunneled hemodialysis catheter via the right internal jugular vein. The catheter tip lies in the right atrium. The catheter is ready for immediate use. Electronically Signed   By: Aletta Edouard M.D.   On: 10/26/2019 11:14   IR Fluoro Guide CV Line Right  Result Date: 10/25/2019 INDICATION: 86 year old male in need secondary to acute kidney injury. to acute kidney injury. 86 year old male in need of hemodialysis of hemodialysis secondary EXAM: TUNNELED CENTRAL VENOUS HEMODIALYSIS CATHETER PLACEMENT WITH ULTRASOUND AND FLUOROSCOPIC GUIDANCE MEDICATIONS: 2 g Ancef 2 g Ancef. The antibiotic was given in an appropriate time interval prior to skin puncture. ANESTHESIA/SEDATION: Moderate (conscious) sedation was employed during this procedure. A total of Versed 1 mg and Fentanyl 25 mcg was administered intravenously. Moderate Sedation Time: 14 minutes. The patient's level of consciousness and vital signs were monitored continuously by radiology nursing throughout the procedure under my direct supervision. FLUOROSCOPY TIME:  Fluoroscopy Time: 0 minutes 36 seconds (3 mGy). COMPLICATIONS: None immediate. PROCEDURE: Informed written consent was obtained from the patient after a discussion of the risks, benefits, and alternatives to treatment. Questions regarding the procedure were encouraged and answered. The  right neck and chest were prepped with chlorhexidine in a sterile fashion, and a sterile drape was applied covering the operative field. Maximum barrier sterile technique with sterile gowns and gloves were used for the procedure. A timeout was performed prior to the initiation of the procedure. After creating a  small venotomy incision, a micropuncture kit was utilized to access the right internal jugular vein under direct, real-time ultrasound guidance after the overlying soft tissues were anesthetized with 1% lidocaine with epinephrine. Ultrasound image documentation was performed. The microwire was kinked to measure appropriate catheter length. A stiff Glidewire was advanced to the level of the IVC and the micropuncture sheath was exchanged for a peel-away sheath. A palindrome tunneled hemodialysis catheter measuring 19 cm from tip to cuff was tunneled in a retrograde fashion from the anterior chest wall to the venotomy incision. The catheter was then placed through the peel-away sheath with tips ultimately positioned within the superior aspect of the right atrium. Final catheter positioning was confirmed and documented with a spot radiographic image. The catheter aspirates and flushes normally. The catheter was flushed with appropriate volume heparin dwells. The catheter exit site was secured with a 0-Prolene retention suture. The venotomy incision was closed with Dermabond. Dressings were applied. The patient tolerated the procedure well without immediate post procedural complication. IMPRESSION: Successful placement of 19 cm tip to cuff tunneled hemodialysis catheter via the right internal jugular vein with tips terminating within the superior aspect of the right atrium. The catheter is ready for immediate use. Electronically Signed   By: Jacqulynn Cadet M.D.   On: 10/25/2019 20:15   IR US Guide Vasc Access Right  Result Date: 10/26/2019 CLINICAL DATA:  Renal failure and status post placement of tunneled hemodialysis catheter yesterday with subsequent pulling and complete dislodgement of the catheter by the patient due to confusion and combativeness. He requires another catheter to be placed for emergent hemodialysis. EXAM: TUNNELED CENTRAL VENOUS HEMODIALYSIS CATHETER PLACEMENT WITH ULTRASOUND AND FLUOROSCOPIC  GUIDANCE ANESTHESIA/SEDATION: 0.5 mg IV Versed; 25 mcg IV Fentanyl. Total Moderate Sedation Time:   19 minutes. The patient's level of consciousness and physiologic status were continuously monitored during the procedure by Radiology nursing. MEDICATIONS: 2 g IV Ancef. FLUOROSCOPY TIME:  24 seconds.  3.2 mGy. PROCEDURE: The procedure, risks, benefits, and alternatives were explained to the patient. Questions regarding the procedure were encouraged and answered. The patient understands and consents to the procedure. A timeout was performed prior to initiating the procedure. The right neck and chest were prepped with chlorhexidine in a sterile fashion, and a sterile drape was applied covering the operative field. Maximum barrier sterile technique with sterile gowns and gloves were used for the procedure. Local anesthesia was provided with 1% lidocaine. Ultrasound was used to confirm patency of the right internal jugular vein. After creating a small venotomy incision, a 21 gauge needle was advanced into the right internal jugular vein under direct, real-time ultrasound guidance. Ultrasound image documentation was performed. After securing guidewire access, an 8 Fr dilator was placed. A J-wire was kinked to measure appropriate catheter length. A Palindrome tunneled hemodialysis catheter measuring 19 cm from tip to cuff was chosen for placement. This was tunneled in a retrograde fashion from the chest wall to the venotomy incision. At the venotomy, serial dilatation was performed and a 16 Fr peel-away sheath was placed over a guidewire. The catheter was then placed through the sheath and the sheath removed. Final catheter positioning was confirmed and documented with a fluoroscopic spot  image. The catheter was aspirated, flushed with saline, and injected with appropriate volume heparin dwells. The venotomy incision was closed with subcutaneous subcuticular 4-0 Vicryl. Dermabond was applied to the incision. The catheter  exit site was secured with 0-Prolene retention sutures. COMPLICATIONS: None.  No pneumothorax. FINDINGS: After catheter placement, the tip lies in the right atrium. The catheter aspirates normally and is ready for immediate use. IMPRESSION: Placement of tunneled hemodialysis catheter via the right internal jugular vein. The catheter tip lies in the right atrium. The catheter is ready for immediate use. Electronically Signed   By: Aletta Edouard M.D.   On: 10/26/2019 11:14   IR US Guide Vasc Access Right  Result Date: 10/25/2019 INDICATION: 86 year old male in need secondary to acute kidney injury. to acute kidney injury. 86 year old male in need of hemodialysis of hemodialysis secondary EXAM: TUNNELED CENTRAL VENOUS HEMODIALYSIS CATHETER PLACEMENT WITH ULTRASOUND AND FLUOROSCOPIC GUIDANCE MEDICATIONS: 2 g Ancef 2 g Ancef. The antibiotic was given in an appropriate time interval prior to skin puncture. ANESTHESIA/SEDATION: Moderate (conscious) sedation was employed during this procedure. A total of Versed 1 mg and Fentanyl 25 mcg was administered intravenously. Moderate Sedation Time: 14 minutes. The patient's level of consciousness and vital signs were monitored continuously by radiology nursing throughout the procedure under my direct supervision. FLUOROSCOPY TIME:  Fluoroscopy Time: 0 minutes 36 seconds (3 mGy). COMPLICATIONS: None immediate. PROCEDURE: Informed written consent was obtained from the patient after a discussion of the risks, benefits, and alternatives to treatment. Questions regarding the procedure were encouraged and answered. The right neck and chest were prepped with chlorhexidine in a sterile fashion, and a sterile drape was applied covering the operative field. Maximum barrier sterile technique with sterile gowns and gloves were used for the procedure. A timeout was performed prior to the initiation of the procedure. After creating a small venotomy incision, a micropuncture kit was  utilized to access the right internal jugular vein under direct, real-time ultrasound guidance after the overlying soft tissues were anesthetized with 1% lidocaine with epinephrine. Ultrasound image documentation was performed. The microwire was kinked to measure appropriate catheter length. A stiff Glidewire was advanced to the level of the IVC and the micropuncture sheath was exchanged for a peel-away sheath. A palindrome tunneled hemodialysis catheter measuring 19 cm from tip to cuff was tunneled in a retrograde fashion from the anterior chest wall to the venotomy incision. The catheter was then placed through the peel-away sheath with tips ultimately positioned within the superior aspect of the right atrium. Final catheter positioning was confirmed and documented with a spot radiographic image. The catheter aspirates and flushes normally. The catheter was flushed with appropriate volume heparin dwells. The catheter exit site was secured with a 0-Prolene retention suture. The venotomy incision was closed with Dermabond. Dressings were applied. The patient tolerated the procedure well without immediate post procedural complication. IMPRESSION: Successful placement of 19 cm tip to cuff tunneled hemodialysis catheter via the right internal jugular vein with tips terminating within the superior aspect of the right atrium. The catheter is ready for immediate use. Electronically Signed   By: Jacqulynn Cadet M.D.   On: 10/25/2019 20:15   DG CHEST PORT 1 VIEW  Result Date: 10/25/2019 CLINICAL DATA:  Placement of hemodialysis catheter with subsequent removal EXAM: PORTABLE CHEST 1 VIEW COMPARISON:  05/22/2017, 10/25/2019 FINDINGS: No central venous catheter identified on the current image. Enlarged cardiomediastinal silhouette with vascular congestion and bilateral interstitial and ground-glass opacity suspect for edema. Probable left pleural  effusion. Aortic atherosclerosis. No pneumothorax. IMPRESSION: Mild  cardiomegaly with vascular congestion, and diffuse interstitial and ground-glass opacity, likely pulmonary edema with probable left pleural effusion Electronically Signed   By: Donavan Foil M.D.   On: 10/25/2019 19:18   ECHOCARDIOGRAM COMPLETE  Result Date: 10/26/2019    ECHOCARDIOGRAM REPORT   Patient Name:   Benjamin Watkins Date of Exam: 10/26/2019 Medical Rec #:  [id number]         Height:       74.0 in Accession #:    [id number]        Weight:       222.2 lb Date of Birth:  11-08-1932         BSA:          2.273 m Patient Age:    27 years          BP:           98/53 mmHg Patient Gender: M                 HR:           99 bpm. Exam Location:  Inpatient Procedure: 2D Echo, Color Doppler, Cardiac Doppler and Intracardiac            Opacification Agent Indications:    I48.91* Unspecified atrial fibrillation  History:        Patient has no prior history of Echocardiogram examinations.                 Risk Factors:Hypertension and Diabetes.  Sonographer:    Raquel Sarna Senior RDCS Referring Phys: [place]  Sonographer Comments: Technically difficult due to poor echo windows. IMPRESSIONS  1. Left ventricular ejection fraction, by estimation, is 30 to 35%. The left ventricle has moderately decreased function. The left ventricle demonstrates regional wall motion abnormalities (see scoring diagram/findings for description). The left ventricular internal cavity size was moderately dilated. Left ventricular diastolic parameters are indeterminate. There is severe hypokinesis to akinesis of all apical segments, to the mid wall level. Function at the basal segments is only mildly hypokinetic. This may be consistent with a stress induced (takotsubo) cardiomyopathy, though multivessel CAD cannot be excluded. LV thrombus excluded by contrast.  2. Right ventricular systolic function is mildly reduced. The right ventricular size is mildly enlarged. There is mildly elevated pulmonary artery systolic pressure.  3. Left  atrial size was moderately dilated.  4. Right atrial size was moderately dilated.  5. The mitral valve is grossly normal. Mild mitral valve regurgitation. No evidence of mitral stenosis.  6. The aortic valve has an indeterminant number of cusps. Aortic valve regurgitation is trivial. Mild to moderate aortic valve sclerosis/calcification is present, without any evidence of aortic stenosis. Comparison(s): No prior Echocardiogram. FINDINGS  Left Ventricle: Left ventricular ejection fraction, by estimation, is 30 to 35%. The left ventricle has moderately decreased function. The left ventricle demonstrates regional wall motion abnormalities. Definity contrast agent was given IV to delineate the left ventricular endocardial borders. The left ventricular internal cavity size was moderately dilated. There is no left ventricular hypertrophy. Left ventricular diastolic parameters are indeterminate.  LV Wall Scoring: The mid and distal anterior wall, mid and distal lateral wall, mid and distal anterior septum, entire apex, mid and distal inferior wall, mid anterolateral segment, and mid inferoseptal segment are akinetic. The basal anteroseptal segment, basal inferolateral segment, basal anterolateral segment, basal anterior segment, basal inferior segment, and basal inferoseptal segment are hypokinetic. There is severe  hypokinesis to akinesis of all apical segments, to the mid wall level. Function at the basal segments is only mildly hypokinetic. This may be consistent with a stress induced (takotsubo) cardiomyopathy, though multivessel CAD cannot be excluded. LV thrombus excluded by contrast. Right Ventricle: The right ventricular size is mildly enlarged. Right vetricular wall thickness was not assessed. Right ventricular systolic function is mildly reduced. There is mildly elevated pulmonary artery systolic pressure. The tricuspid regurgitant velocity is 2.57 m/s, and with an assumed right atrial pressure of 8 mmHg, the  estimated right ventricular systolic pressure is 12.4 mmHg. Left Atrium: Left atrial size was moderately dilated. Right Atrium: Right atrial size was moderately dilated. Pericardium: Trivial pericardial effusion is present. Mitral Valve: The mitral valve is grossly normal. There is mild thickening of the mitral valve leaflet(s). There is mild calcification of the mitral valve leaflet(s). Mild mitral annular calcification. Mild mitral valve regurgitation. No evidence of mitral valve stenosis. Tricuspid Valve: The tricuspid valve is normal in structure. Tricuspid valve regurgitation is mild . No evidence of tricuspid stenosis. Aortic Valve: The aortic valve has an indeterminant number of cusps. . There is moderate thickening and moderate calcification of the aortic valve. Aortic valve regurgitation is trivial. Mild to moderate aortic valve sclerosis/calcification is present, without any evidence of aortic stenosis. There is moderate thickening of the aortic valve. There is moderate calcification of the aortic valve. Pulmonic Valve: The pulmonic valve was not well visualized. Pulmonic valve regurgitation is not visualized. No evidence of pulmonic stenosis. Aorta: The aortic root and ascending aorta are structurally normal, with no evidence of dilitation. Pulmonary Artery: The pulmonary artery is not well seen. Venous: The inferior vena cava was not well visualized. IAS/Shunts: No atrial level shunt detected by color flow Doppler.  LEFT VENTRICLE PLAX 2D LVIDd:         5.40 cm  Diastology LVIDs:         4.30 cm  LV e' lateral: 7.18 cm/s LV PW:         1.10 cm  LV e' medial:  2.50 cm/s LV IVS:        1.10 cm LVOT diam:     2.10 cm LV SV:         53 LV SV Index:   23 LVOT Area:     3.46 cm  RIGHT VENTRICLE RV S prime:     7.62 cm/s TAPSE (M-mode): 1.6 cm LEFT ATRIUM              Index       RIGHT ATRIUM           Index LA diam:        3.60 cm  1.58 cm/m  RA Area:     28.10 cm LA Vol (A2C):   94.2 ml  41.44 ml/m RA  Volume:   95.30 ml  41.93 ml/m LA Vol (A4C):   102.0 ml 44.87 ml/m LA Biplane Vol: 102.0 ml 44.87 ml/m  AORTIC VALVE LVOT Vmax:   77.40 cm/s LVOT Vmean:  56.700 cm/s LVOT VTI:    0.153 m  AORTA Ao Root diam: 3.60 cm TRICUSPID VALVE TR Peak grad:   26.4 mmHg TR Vmax:        257.00 cm/s  SHUNTS Systemic VTI:  0.15 m Systemic Diam: 2.10 cm Buford Dresser MD Electronically signed by Buford Dresser MD Signature Date/Time: 10/26/2019/3:28:10 PM    Final       Assessment and Plan:   1. 1.  Paroxysmal atrial fibrillation: The patient has episodes of atrial fibrillation.  During these times his blood pressure drops down into the 70s.  He was previously on IV amiodarone but this was discontinued apparently because of an IV issue.  He now has 2 IVs established.  We will resume his amiodarone drip.  I think this is will be our best way to treat his paroxysmal atrial fibrillation.  2.  Acute on chronic renal insufficiency.  His creatinine is up over 8.  He is hyperkalemic.  3.  Acute on chronic combined systolic and diastolic congestive heart failure Echocardiogram today reveals an echo and LVEF of 30 to 35%.  He has mild pulmonary hypertension with an estimated PA pressure of 34 mmHg.  He has moderate enlargement of both atrium.  There is mild mitral regurgitation and mild tricuspid regurgitation. He will need to start on medications for his acute on chronic systolic heart failure.  I think the first order of business is to start dialysis.  His creatinine has increased quickly over the past several days and he is persistently hyperkalemic.  4. Anemia:   Plans per primary md  5.  CLL:  Plans per oncology  6.  DM - plans per [facility]   For questions or updates, please contact [place] Please consult [email or web address] for contact info under     Signed, Mertie Moores, MD  10/26/2019 5:29 PM

## 2019-10-26 NOTE — Procedures (Signed)
Cortrak  Person Inserting Tube:  King, Safa Derner E, RD Tube Type:  Cortrak - 43 inches Tube Location:  Right nare Initial Placement:  Stomach Secured by: Bridle Technique Used to Measure Tube Placement:  Documented cm marking at nare/ corner of mouth Cortrak Secured At:  70 cm    Cortrak Tube Team Note:  Consult received to place a Cortrak feeding tube.   No x-ray is required. RN may begin using tube.   If the tube becomes dislodged please keep the tube and contact the Cortrak team at [email or web address] ([email or web address]) for replacement.  If after hours and replacement cannot be delayed, place a NG tube and confirm placement with an abdominal x-ray.   Amyjo Mizrachi King, MS, RD, LDN Pager number available on Amion 

## 2019-10-26 NOTE — Progress Notes (Signed)
[place] for Heparin Indication: atrial fibrillation  Allergies  Allergen Reactions  . Hydrochlorothiazide Other (See Comments)    "It just bothers me"  . Lisinopril Other (See Comments)    "Takes away energy"    Patient Measurements: Height: 6\' 2"  (188 cm) Weight: 222 lb 3.6 oz (100.8 kg) IBW/kg (Calculated) : 82.2 Heparin Dosing Weight: 103 kg  Vital Signs: Temp: 98.2 F (36.8 C) (03/31 0317) Temp Source: Oral (03/31 0317) BP: 103/51 (03/31 0437) Pulse Rate: 99 (03/31 0458)  Labs: Recent Labs    10/24/19 0539 10/26/19 0508  HGB 8.9*  --   HCT 29.1*  --   PLT 118*  --   HEPARINUNFRC  --  0.18*  CREATININE 7.56*  7.51*  --     Estimated Creatinine Clearance: 9.2 mL/min (A) (by C-G formula based on SCr of 7.56 mg/dL (H)).   Medical History: Past Medical History:  Diagnosis Date  . CLL (chronic lymphocytic leukemia) ([place])   . Diabetes mellitus without complication ([place])   . Diverticulosis   . History of colon polyps   . Hypertension   . Low back pain   . Right foot drop     Assessment: 86 yr old male admitted on 10/18/19 with AKI/dehydration, now with a fib with RVR, Pharmacy is consulted to dose heparin. Pt was on no anticoagulants PTA.   Nephrology plans to initiate HD. Patient had tunneled HD catheter placed in IR this afternoon (~6 hrs ago), but he pulled out catheter. Per RN, there is no bleeding at HD catheter 'removal' site. He is tentatively scheduled for image-guided tunneled HD catheter placement tomorrow in IR.  Initial heparin level is  0.18  Goal of Therapy:  Heparin level 0.3-0.7 units/ml Monitor platelets by anticoagulation protocol: Yes   Plan:  Increase heparin to 1650 units / hr Follow up after IR today Monitor daily heparin level, CBC Monitor for signs/symptoms of bleeding  Thank you Anette Guarneri, PharmD 10/26/2019,5:53 AM

## 2019-10-26 NOTE — Progress Notes (Signed)
Pt HR -120's-150's Afib, BP sys 71, paged Dr. Doristine Bosworth. 63 Dr. Doristine Bosworth at bedside, pt returned to [place] [facility]. BP 97/52. Order had been placed for Dig, but Dr. Doristine Bosworth stated to hold as long as HR stable. Awaiting cardiology to see. Cont to monitor. Carroll Kinds RN

## 2019-10-26 NOTE — Progress Notes (Signed)
Notified pt's son [name] that pt was moved to [phone] and that pt would be having diaysis tonight. Carroll Kinds RN

## 2019-10-26 NOTE — Progress Notes (Signed)
Echocardiogram 2D Echocardiogram has been performed.  Benjamin Watkins 10/26/2019, 10:33 AM

## 2019-10-26 NOTE — Procedures (Signed)
Interventional Radiology Procedure Note  Procedure: Right IJ tunneled HD catheter placement  Complications: None  Estimated Blood Loss: < 10 mL  Findings: 19 cm Palindrome catheter placed with tip at SVC/RA junction. OK to use.  Venetia Night. Kathlene Cote, M.D Pager:  [phone]

## 2019-10-26 NOTE — Progress Notes (Signed)
[place] for Heparin Indication: atrial fibrillation  Allergies  Allergen Reactions  . Hydrochlorothiazide Other (See Comments)    "It just bothers me"  . Lisinopril Other (See Comments)    "Takes away energy"    Patient Measurements: Height: 6\' 2"  (188 cm) Weight: 222 lb 3.6 oz (100.8 kg) IBW/kg (Calculated) : 82.2 Heparin Dosing Weight: 103 kg  Vital Signs: Temp: 98.1 F (36.7 C) (03/31 1658) Temp Source: Axillary (03/31 1658) BP: 71/38 (03/31 1658) Pulse Rate: 124 (03/31 1658)  Labs: Recent Labs    10/24/19 0539 10/24/19 0539 10/26/19 0508 10/26/19 1436 10/26/19 1807  HGB 8.9*  --  8.0*  --   --   HCT 29.1*  --  26.0*  --   --   PLT 118*  --  114*  --   --   HEPARINUNFRC  --   --  0.18*  --  0.44  CREATININE 7.56*  7.51*   < > 8.30* 8.58* 8.61*   < > = values in this interval not displayed.    Estimated Creatinine Clearance: 8.1 mL/min (A) (by C-G formula based on SCr of 8.61 mg/dL (H)).   Medical History: Past Medical History:  Diagnosis Date  . CLL (chronic lymphocytic leukemia) ([place])   . Diabetes mellitus without complication ([place])   . Diverticulosis   . History of colon polyps   . Hypertension   . Low back pain   . Right foot drop     Assessment: 86 yr old male admitted on 10/18/19 with AKI/dehydration, now with a fib with RVR. Pharmacy was consulted to dose heparin. Pt was on no anticoagulants PTA.   Pt had R IJ tunneled HD catheter placed this morning. Per RN, heparin was restarted at 1650 units/hr ~9:30 AM after the procedure. Heparin level ~9 hrs after heparin infusion was restarted was 0.44 units/ml, which is within the goal range for this pt. H/H 8.0/26.0, platelets 114 (CBC trending down). Per RN, no issues with IV or bleeding observed.  Goal of Therapy:  Heparin level 0.3-0.7 units/ml Monitor platelets by anticoagulation protocol: Yes   Plan:  Continue heparin infusion at 1650 units/hr Check  confirmatory heparin level in 8 hrs Monitor daily heparin level, CBC Monitor for signs/symptoms of bleeding  Gillermina Hu, PharmD, BCPS, [facility] Clinical Pharmacist 10/26/2019,6:54 PM

## 2019-10-26 NOTE — Progress Notes (Signed)
[facility] ROUNDING NOTE   Subjective:   This is an 86-year gentleman with a history of CLL with acute on chronic kidney disease stage III.  He was admitted from the oncology office for abnormal labs including a creatinine of 4.58.  On 10/03/2019 blood work showed a creatinine of 2.59.  At that time he also had a rash having recently had venetoclax for treatment of his CLL.  He was started on oral steroids.  In addition to this the patient was also on chlorthalidone and PPI all of which could have contributed to the rash.  Is acute on chronic kidney failure was thought to be secondary to either acute interstitial nephritis or acute tubular necrosis.  He was transferred from [place] [facility] 10/24/2019 to [facility] for initiation of dialysis.  It was thought that his fluctuating mental status was secondary to uremia.  Patient has become increasingly more confused.  He pulled out his  dialysis cath 10/24/2029.  He is scheduled for another dialysis catheter placement 10/27/2019.  Furthermore he is developing atrial fibrillation with rapid rate and hypotension.  Blood pressure 106/52 pulse 129 temperature 98.2 O2 sats 100% room air.  Urine output 1.4 L 10/25/2019  Sodium 142 potassium 6.5 chloride 112 CO2 14 BUN 141 creatinine 8.3 glucose 171 phosphorus 6.6 magnesium 2.1 albumin 2.5 WBC 135 hemoglobin 8 platelets 114  Aspirin 81 mg daily, insulin sliding scale, sodium bicarbonate 1.3 g twice daily,   Objective:  Vital signs in last 24 hours:  Temp:  [97.5 F (36.4 C)-98.4 F (36.9 C)] 98.2 F (36.8 C) (03/31 0317) Pulse Rate:  [92-150] 112 (03/31 0618) Resp:  [14-32] 24 (03/31 0618) BP: (82-116)/(45-72) 106/59 (03/31 0618) SpO2:  [94 %-100 %] 100 % (03/31 0618) Weight:  [100.8 kg] 100.8 kg (03/31 0317)  Weight change: -2.3 kg Filed Weights   10/18/19 1153 10/24/19 2103 10/26/19 0317  Weight: 98.9 kg 103.1 kg 100.8 kg    Intake/Output: I/O last 3 completed  shifts: In: 1671.7 [P.O.:120; I.V.:1051.7; IV Piggyback:500] Out: 1725 [Urine:1725]   Intake/Output this shift:  No intake/output data recorded.  Elderly male nondistressed  CVS- RRR no rubs RS-clear bilaterally ABD- BS present soft non-distended Foley catheter EXT- no edema   Basic Metabolic Panel: Recent Labs  Lab 10/20/19 0858 10/20/19 1348 10/21/19 0614 10/21/19 0614 10/22/19 0449 10/22/19 0449 10/23/19 0351 10/24/19 0539 10/26/19 0508  NA 141  --  142  --  144  143  --  139  143 145  142 142  K 4.2  --  4.5  --  3.9  4.0  --  3.6  3.6 4.9  4.6 6.5*  CL 110  --  110  --  112*  111  --  110  112* 112*  111 112*  CO2 18*  --  15*  --  19*  19*  --  18*  18* 15*  16* 15*  GLUCOSE 217*  --  268*  --  221*  219*  --  210*  212* 225*  228* 171*  BUN 77*  --  82*  --  97*  93*  --  95*  99* 112*  111* 141*  CREATININE 5.36*  --  5.93*  --  6.81*  6.47*  --  6.84*  6.91* 7.56*  7.51* 8.30*  CALCIUM 7.8*  --  7.7*   < > 7.5*  7.4*   < > 7.0*  7.1* 7.9*  7.6* 7.0*  MG 2.0  --   --   --   --   --   --   --    2.1  PHOS 4.5   < > 4.6  --  5.3*  --  4.7* 5.8* 6.6*   < > = values in this interval not displayed.    Liver Function Tests: Recent Labs  Lab 10/21/19 0614 10/22/19 0449 10/23/19 0351 10/24/19 0539 10/26/19 0508  ALBUMIN 2.9* 2.9* 2.7* 3.0* 2.5*   No results for input(s): LIPASE, AMYLASE in the last 168 hours. No results for input(s): AMMONIA in the last 168 hours.  CBC: Recent Labs  Lab 10/20/19 0858 10/22/19 0449 10/23/19 0351 10/24/19 0539 10/26/19 0508  WBC 72.7* 75.4* 83.1* 100.0* 135.4*  NEUTROABS 5.3 6.2 6.6 7.1  --   HGB 8.5* 8.1* 7.7* 8.9* 8.0*  HCT 27.6* 26.2* 24.8* 29.1* 26.0*  MCV 104.9* 104.8* 105.5* 105.8* 102.0*  PLT 74* 91* 92* 118* 114*    Cardiac Enzymes: Recent Labs  Lab 10/20/19 1348  CKTOTAL 42*    BNP: Invalid input(s): POCBNP  CBG: Recent Labs  Lab 10/24/19 2032 10/25/19 0651 10/25/19 1103  10/25/19 1617 10/25/19 2037  GLUCAP 162* 149* 166* 212* 155*    Microbiology: Results for orders placed or performed during the hospital encounter of 10/18/19  SARS CORONAVIRUS 2 (TAT 6-24 HRS) Nasopharyngeal Nasopharyngeal Swab     Status: None   Collection Time: 10/18/19  2:49 PM   Specimen: Nasopharyngeal Swab  Result Value Ref Range Status   SARS Coronavirus 2 NEGATIVE NEGATIVE Final    Comment: (NOTE) SARS-CoV-2 target nucleic acids are NOT DETECTED. The SARS-CoV-2 RNA is generally detectable in upper and lower respiratory specimens during the acute phase of infection. Negative results do not preclude SARS-CoV-2 infection, do not rule out co-infections with other pathogens, and should not be used as the sole basis for treatment or other patient management decisions. Negative results must be combined with clinical observations, patient history, and epidemiological information. The expected result is Negative. Fact Sheet for Patients: https://www.fda.gov/media/138098/download Fact Sheet for Healthcare Providers: https://www.fda.gov/media/138095/download This test is not yet approved or cleared by the [place] FDA and  has been authorized for detection and/or diagnosis of SARS-CoV-2 by FDA under an Emergency Use Authorization (EUA). This EUA will remain  in effect (meaning this test can be used) for the duration of the COVID-19 declaration under Section 56 4(b)(1) of the Act, 21 U.S.C. section 360bbb-3(b)(1), unless the authorization is terminated or revoked sooner. Performed at [facility], [address] [place], [place] [address]     Coagulation Studies: No results for input(s): LABPROT, INR in the last 72 hours.  Urinalysis: No results for input(s): COLORURINE, LABSPEC, PHURINE, GLUCOSEU, HGBUR, BILIRUBINUR, KETONESUR, PROTEINUR, UROBILINOGEN, NITRITE, LEUKOCYTESUR in the last 72 hours.  Invalid input(s): APPERANCEUR    Imaging: IR Fluoro Guide CV  Line Right  Result Date: 10/25/2019 INDICATION: 86-year-old male in need secondary to acute kidney injury. to acute kidney injury. 86-year-old male in need of hemodialysis of hemodialysis secondary EXAM: TUNNELED CENTRAL VENOUS HEMODIALYSIS CATHETER PLACEMENT WITH ULTRASOUND AND FLUOROSCOPIC GUIDANCE MEDICATIONS: 2 g Ancef 2 g Ancef. The antibiotic was given in an appropriate time interval prior to skin puncture. ANESTHESIA/SEDATION: Moderate (conscious) sedation was employed during this procedure. A total of Versed 1 mg and Fentanyl 25 mcg was administered intravenously. Moderate Sedation Time: 14 minutes. The patient's level of consciousness and vital signs were monitored continuously by radiology nursing throughout the procedure under my direct supervision. FLUOROSCOPY TIME:  Fluoroscopy Time: 0 minutes 36 seconds (3 mGy). COMPLICATIONS: None immediate. PROCEDURE: Informed written consent was obtained from   the patient after a discussion of the risks, benefits, and alternatives to treatment. Questions regarding the procedure were encouraged and answered. The right neck and chest were prepped with chlorhexidine in a sterile fashion, and a sterile drape was applied covering the operative field. Maximum barrier sterile technique with sterile gowns and gloves were used for the procedure. A timeout was performed prior to the initiation of the procedure. After creating a small venotomy incision, a micropuncture kit was utilized to access the right internal jugular vein under direct, real-time ultrasound guidance after the overlying soft tissues were anesthetized with 1% lidocaine with epinephrine. Ultrasound image documentation was performed. The microwire was kinked to measure appropriate catheter length. A stiff Glidewire was advanced to the level of the IVC and the micropuncture sheath was exchanged for a peel-away sheath. A palindrome tunneled hemodialysis catheter measuring 19 cm from tip to cuff was tunneled in  a retrograde fashion from the anterior chest wall to the venotomy incision. The catheter was then placed through the peel-away sheath with tips ultimately positioned within the superior aspect of the right atrium. Final catheter positioning was confirmed and documented with a spot radiographic image. The catheter aspirates and flushes normally. The catheter was flushed with appropriate volume heparin dwells. The catheter exit site was secured with a 0-Prolene retention suture. The venotomy incision was closed with Dermabond. Dressings were applied. The patient tolerated the procedure well without immediate post procedural complication. IMPRESSION: Successful placement of 19 cm tip to cuff tunneled hemodialysis catheter via the right internal jugular vein with tips terminating within the superior aspect of the right atrium. The catheter is ready for immediate use. Electronically Signed   By: Heath  McCullough M.D.   On: 10/25/2019 20:15   IR US Guide Vasc Access Right  Result Date: 10/25/2019 INDICATION: 86-year-old male in need secondary to acute kidney injury. to acute kidney injury. 86-year-old male in need of hemodialysis of hemodialysis secondary EXAM: TUNNELED CENTRAL VENOUS HEMODIALYSIS CATHETER PLACEMENT WITH ULTRASOUND AND FLUOROSCOPIC GUIDANCE MEDICATIONS: 2 g Ancef 2 g Ancef. The antibiotic was given in an appropriate time interval prior to skin puncture. ANESTHESIA/SEDATION: Moderate (conscious) sedation was employed during this procedure. A total of Versed 1 mg and Fentanyl 25 mcg was administered intravenously. Moderate Sedation Time: 14 minutes. The patient's level of consciousness and vital signs were monitored continuously by radiology nursing throughout the procedure under my direct supervision. FLUOROSCOPY TIME:  Fluoroscopy Time: 0 minutes 36 seconds (3 mGy). COMPLICATIONS: None immediate. PROCEDURE: Informed written consent was obtained from the patient after a discussion of the risks,  benefits, and alternatives to treatment. Questions regarding the procedure were encouraged and answered. The right neck and chest were prepped with chlorhexidine in a sterile fashion, and a sterile drape was applied covering the operative field. Maximum barrier sterile technique with sterile gowns and gloves were used for the procedure. A timeout was performed prior to the initiation of the procedure. After creating a small venotomy incision, a micropuncture kit was utilized to access the right internal jugular vein under direct, real-time ultrasound guidance after the overlying soft tissues were anesthetized with 1% lidocaine with epinephrine. Ultrasound image documentation was performed. The microwire was kinked to measure appropriate catheter length. A stiff Glidewire was advanced to the level of the IVC and the micropuncture sheath was exchanged for a peel-away sheath. A palindrome tunneled hemodialysis catheter measuring 19 cm from tip to cuff was tunneled in a retrograde fashion from the anterior chest wall to the venotomy incision.   The catheter was then placed through the peel-away sheath with tips ultimately positioned within the superior aspect of the right atrium. Final catheter positioning was confirmed and documented with a spot radiographic image. The catheter aspirates and flushes normally. The catheter was flushed with appropriate volume heparin dwells. The catheter exit site was secured with a 0-Prolene retention suture. The venotomy incision was closed with Dermabond. Dressings were applied. The patient tolerated the procedure well without immediate post procedural complication. IMPRESSION: Successful placement of 19 cm tip to cuff tunneled hemodialysis catheter via the right internal jugular vein with tips terminating within the superior aspect of the right atrium. The catheter is ready for immediate use. Electronically Signed   By: Jacqulynn Cadet M.D.   On: 10/25/2019 20:15   DG CHEST PORT 1  VIEW  Result Date: 10/25/2019 CLINICAL DATA:  Placement of hemodialysis catheter with subsequent removal EXAM: PORTABLE CHEST 1 VIEW COMPARISON:  05/22/2017, 10/25/2019 FINDINGS: No central venous catheter identified on the current image. Enlarged cardiomediastinal silhouette with vascular congestion and bilateral interstitial and ground-glass opacity suspect for edema. Probable left pleural effusion. Aortic atherosclerosis. No pneumothorax. IMPRESSION: Mild cardiomegaly with vascular congestion, and diffuse interstitial and ground-glass opacity, likely pulmonary edema with probable left pleural effusion Electronically Signed   By: Donavan Foil M.D.   On: 10/25/2019 19:18     Medications:   . sodium chloride    . sodium chloride    . ceFAZolin    . heparin 1,650 Units/hr (10/26/19 0617)   . sodium chloride   Intravenous Once  . aspirin EC  81 mg Oral Daily  . Chlorhexidine Gluconate Cloth  6 each Topical Daily  . Chlorhexidine Gluconate Cloth  6 each Topical Q0600  . feeding supplement (ENSURE ENLIVE)  237 mL Oral BID BM  . feeding supplement (PRO-STAT SUGAR FREE 64)  30 mL Oral BID  . fentaNYL      . heparin      . insulin aspart  0-15 Units Subcutaneous TID WC  . insulin aspart  0-5 Units Subcutaneous QHS  . insulin glargine  12 Units Subcutaneous Daily  . lidocaine      . midazolam      . sodium bicarbonate  1,300 mg Oral BID  . sodium chloride flush  10-40 mL Intracatheter Q12H   sodium chloride, sodium chloride, acetaminophen **OR** acetaminophen, alteplase, famotidine, labetalol, lidocaine (PF), lidocaine-prilocaine, mirtazapine, ondansetron **OR** ondansetron (ZOFRAN) IV, pentafluoroprop-tetrafluoroeth, sodium chloride flush  Assessment/ Plan:   Acute on chronic kidney disease.  Etiology unclear ultrasound without obstruction possible acute interstitial nephritis in view of rash.  Short-term dialysis anticipating some recovery of GFR.  Patient with fairly pronounced azotemia.   Dialysis catheter placed 10/25/2019 patient agitated and confused to remove dialysis catheter.  Placement of dialysis catheter scheduled for 10/25/2019  Hyperkalemia potassium elevated at 6.5 this morning we will give Lokelma, Kayexalate and will i change sodium bicarbonate oral to IV sodium bicarbonate 100 cc an hour  CLL follow-up with Dr. [name].  Would continue with close follow-up with oncology.  Hypotension.  Atrial fibrillation with rapid ventricular rate.  This may be problematic and limits his ability to receive dialysis treatments.  Metabolic acidosis we will give IV sodium bicarbonate 100 cc an hour  Anemia followed by heme appreciate assistance   LOS: [place] _0 _1 :40 AM

## 2019-10-26 NOTE — Progress Notes (Signed)
Nutrition Follow-up  DOCUMENTATION CODES:   Non-severe (moderate) malnutrition in context of chronic illness  INTERVENTION:  Monitor magnesium, potassium, and phosphorus daily for at least 3 days, MD to replete as needed, as pt is at risk for refeeding syndrome given poor PO intake.  Begin Osmolite 1.5 cal @ 47ml/hr, increase 87ml/hr Q8H until goal rate of 2ml/hr (1530ml) is reached  75ml Pro-stat BID  Free water per MD/PA  At goal, tube feeding will provide 2540 kcal, 127 grams protein, 1188.62ml free water  NUTRITION DIAGNOSIS:   Moderate Malnutrition related to chronic illness(cancer) as evidenced by percent weight loss, mild muscle depletion, mild fat depletion.   GOAL:   Patient will meet greater than or equal to 90% of their needs   MONITOR:   TF tolerance, Weight trends, Labs, I & O's  REASON FOR ASSESSMENT:   Malnutrition Screening Tool    ASSESSMENT:   86 year old male with past medical history of chronic lymphocytic leukemia, HTN, DM2, and diverticulosis presented from outpatient oncology office for evaluation of abnormal labs (BUN 69, Cr 4.58, WBC 82.5, Hgb 8.1, plt 79) Patient reports feeling weak, decreased po intake and unable to drink as much water as needed due to sleeping all day, occasionally feeling light headed when he walks and has had elevated sugars in the high 200s over the past couple of weeks.  3/30 tunneled HD catheter placed; pt pulled   Per PA, plan is for a replacement HD catheter to be placed today so HD can be initiated.  Discussed pt with MD.   PO Intake: 0% x last 8 recorded meals  Pt noted to have a 6.4% wt loss x1 month and a 13.5% wt loss x5 months, both significant for time frame.   Medications reviewed and include: Ensure Enlive BID, 30ml Pro-stat BID, Novolog, Lantus, Sodium Bicarbonate  Labs reviewed: Potassium 6.5 (H), Phosphorus 6.6 (H), Corrected calcium 8.2 (L)  UOP: 1,556ml x24 hours I/O: -376.67ml since  admit  NUTRITION - FOCUSED PHYSICAL EXAM:    Most Recent Value  Orbital Region  No depletion  Upper Arm Region  Mild depletion  Thoracic and Lumbar Region  Mild depletion  Buccal Region  No depletion  Temple Region  No depletion  Clavicle Bone Region  Mild depletion  Clavicle and Acromion Bone Region  Moderate depletion  Scapular Bone Region  Mild depletion  Dorsal Hand  Mild depletion  Patellar Region  Mild depletion  Anterior Thigh Region  Mild depletion  Posterior Calf Region  Mild depletion  Edema (RD Assessment)  Moderate  Hair  Reviewed  Eyes  Reviewed  Mouth  Reviewed  Skin  Reviewed  Nails  Reviewed       Diet Order:   Diet Order            Diet NPO time specified Except for: Sips with Meds  Diet effective midnight              EDUCATION NEEDS:   Not appropriate for education at this time  Skin:  Skin Assessment: Reviewed RN Assessment  Last BM:  3/29  Height:   Ht Readings from Last 1 Encounters:  10/18/19 6\' 2"  (1.88 m)    Weight:   Wt Readings from Last 1 Encounters:  10/26/19 100.8 kg    BMI:  Body mass index is 28.53 kg/m.  Estimated Nutritional Needs:   Kcal:  2400-2600  Protein:  120-135 grams  Fluid:  1068ml + UOP   [place],  MS, RD, LDN RD pager number and weekend/on-call pager number located in [place].

## 2019-10-26 NOTE — Progress Notes (Signed)
PROGRESS NOTE    Benjamin Watkins  JME:[id number] DOB: 1933/01/23 DOA: 10/18/2019 PCP: [name], MD    Brief Narrative:  86 y.o.malewith PMH of CLL, HTN, DM2. Patient was sent to ED from oncology office for abnormal labs including creatinine of 4.58.  Patient follows up with Dr. [name] for CLL. He was seen on 3/8, underwent blood work hemoglobin was 8.5, creatinine was 2.59. One of the CLL medications by the name of venetoclax was held.  He was started on oral prednisone for treatment of rash. Patient was asked to drink plenty water but apparently he was not able to drink enough water because he remained sleepy most of the hours in the day.  Repeat blood work was obtained on 3/23. Hemoglobin was noted to be low at 8.1, creatinine elevated to 4.58.  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 -Nephrology following. Suspected drug-induced kidney injury secondary to [place], -patient was also on chlorthalidone, PPI. -Offending meds have been stopped. -Pt was transferred to [facility] to initiate HD.  -Pt s/p RIJ tunneled cath 3/30 by IR however patient pulled that out.  He had another right IJ tunnel catheter placement on 10/26/2019.  Plan for initiation of HD per nephrology.  Challenging task due to patient's agitation and acute metabolic encephalopathy.  Acute metabolic encephalopathy - Remains somewhat confused this AM -  Likely secondary to a combination of uremia as well as hospital induced delirium and possibly leukocytosis secondary to  CCL. -Continue to monitor mental status.  Patient has Remeron as needed at bedtime.  CLL (chronic lymphocytic leukemia) -WBC was 82.5 on admission, progressively increasing and now 135,000 today.  It was in 30-60 range in the past -Dr. Pietro Cassis discussed with oncologist Dr. [name].  He states that patient's CLL is not immediately life-threatening.  His treatment can be held till other medical conditions are stabilized.  -Venclexta has been on hold.  We will recheck WBC tomorrow.  If more significant jump tomorrow then will consider consulting oncology.  Chronic anemia -Hb 8.1 on presentation. 1 unit of PRBC was given on the day of admission. Hemoglobin remains stable. repeat cbc in AM.  DM (diabetes mellitus), type 2  -Home meds include glimepiride, ReliOn 70/30 insulin, 70 units twice a day -Insulin requirement is currently low because of poor creatinine clearance. -Currently on Lantus 12 units daily as well as sliding scale insulin.   -Glucose currently stable.  Essential hypertension: -Home meds include Coreg 25 mg twice daily and chlorthalidone.  Coreg was stopped on 10/25/2019 when he developed atrial fibrillation and was switched to amiodarone.  Blood pressure remains on the low side.  We will continue to hold that as well as chlorthalidone.  New onset atrial fibrillation with rapid ventricular response: Late afternoon of 10/25/2019, patient developed new onset atrial fibrillation with rapid ventricular response.  Due to low blood pressure, he was started on amiodarone drip as well as heparin drip.  Overnight, he flipped into sinus rhythm.  His IV had infiltrated so amiodarone was not resumed.  Patient remained mostly  in the normal sinus rhythm up until this afternoon when he went to atrial fibrillation with heart rates between 120 and 140.  Consulted cardiology.  Currently he is only on heparin drip which I will continue and defer further rate control medications to cardiology.  DVT  prophylaxis: Heparin drip Code Status: Full Family Communication: Pt in room, family not at bedside Disposition Plan: From home, plan home health PT or SNF when OK to d/c per nephrology  Consultants:   Nephrology  IR  Procedures:   Tunneled HD cath placed by IR 10/25/19  Repeat tunneled HD catheter placement by IR on 10/26/2019  Antimicrobials: Anti-infectives (From admission, onward)   Start     Dose/Rate Route Frequency Ordered Stop   10/26/19 0753  ceFAZolin (ANCEF) 2-4 GM/100ML-% IVPB    Note to Pharmacy: [name]   : cabinet override      10/26/19 0753 10/26/19 1959   10/25/19 1500  ceFAZolin (ANCEF) IVPB 2g/100 mL premix  Status:  Discontinued     2 g 200 mL/hr over 30 Minutes Intravenous To Radiology 10/24/19 1619 10/25/19 1916   10/25/19 1228  ceFAZolin (ANCEF) IVPB 1 g/50 mL premix     over 30 Minutes  Continuous PRN 10/25/19 1233 10/25/19 1228   10/25/19 1217  ceFAZolin (ANCEF) 2-4 GM/100ML-% IVPB    Note to Pharmacy: [name]   : cabinet override      10/25/19 1217 10/26/19 0029      Subjective: Patient seen and examined.  He was alert and mostly oriented.  He was able to tell me that he is in the hospital but could not tell me the name and the reason for being in the hospital.  He was able to tell me the month of March and year 2021 and his name.  Objective: Vitals:   10/26/19 0850 10/26/19 0855 10/26/19 0900 10/26/19 1217  BP: (!) 103/47 (!) 103/47 (!) 103/51 (!) 112/93  Pulse: 98 98 96 61  Resp: _0 Temp:      TempSrc:      SpO2: 99% 99% 99% 94%  Weight:      Height:        Intake/Output Summary (Last 24 hours) at 10/26/2019 1426 Last data filed at 10/26/2019 0855 Gross per 24 hour  Intake 597.94 ml  Output 775 ml  Net -177.06 ml   Filed Weights   10/18/19 1153 10/24/19 2103 10/26/19 0317  Weight: 98.9 kg 103.1 kg 100.8 kg    Examination: General exam: Appears calm and comfortable but slightly confused Respiratory system:  Clear to auscultation but diminished breath sounds. Respiratory effort normal. Cardiovascular system: S1 & S2 heard, irregularly irregular rate and rhythm. No JVD, murmurs, rubs, gallops or clicks. No pedal edema. Gastrointestinal system: Abdomen is nondistended, soft and nontender. No organomegaly or masses felt. Normal bowel sounds heard. Central nervous system: Alert and oriented x3. No focal neurological deficits. Extremities: Symmetric 5 x 5 power. Skin: No rashes, lesions or ulcers.  Psychiatry: Judgement and insight appear poor. Mood & affect flat.   Data Reviewed: I have personally reviewed following labs and imaging studies  CBC: Recent Labs  Lab 10/20/19 0858 10/22/19 0449 10/23/19 0351 10/24/19 0539 10/26/19 0508  WBC 72.7* 75.4* 83.1* 100.0* 135.4*  NEUTROABS 5.3 6.2 6.6 7.1  --   HGB 8.5* 8.1* 7.7* 8.9* 8.0*  HCT 27.6* 26.2* 24.8* 29.1* 26.0*  MCV 104.9* 104.8* 105.5* 105.8* 102.0*  PLT 74* 91* 92* 118* 114*  Basic Metabolic Panel: Recent Labs  Lab 10/20/19 0858 10/20/19 1348 10/21/19 0614 10/22/19 0449 10/23/19 0351 10/24/19 0539 10/26/19 0508  NA 141  --  142 144  143 139  143 145  142 142  K 4.2  --  4.5 3.9  4.0 3.6  3.6 4.9  4.6 6.5*  CL 110  --  110 112*  111 110  112* 112*  111 112*  CO2 18*  --  15* 19*  19* 18*  18* 15*  16* 15*  GLUCOSE 217*  --  268* 221*  219* 210*  212* 225*  228* 171*  BUN 77*  --  82* 97*  93* 95*  99* 112*  111* 141*  CREATININE 5.36*  --  5.93* 6.81*  6.47* 6.84*  6.91* 7.56*  7.51* 8.30*  CALCIUM 7.8*  --  7.7* 7.5*  7.4* 7.0*  7.1* 7.9*  7.6* 7.0*  MG 2.0  --   --   --   --   --  2.1  PHOS 4.5   < > 4.6 5.3* 4.7* 5.8* 6.6*   < > = values in this interval not displayed.   GFR: Estimated Creatinine Clearance: 8.4 mL/min (A) (by C-G formula based on SCr of 8.3 mg/dL (H)). Liver Function Tests: Recent Labs  Lab 10/21/19 1856 10/22/19 0449 10/23/19 0351 10/24/19 0539 10/26/19 0508  ALBUMIN  2.9* 2.9* 2.7* 3.0* 2.5*   No results for input(s): LIPASE, AMYLASE in the last 168 hours. No results for input(s): AMMONIA in the last 168 hours. Coagulation Profile: No results for input(s): INR, PROTIME in the last 168 hours. Cardiac Enzymes: Recent Labs  Lab 10/20/19 1348  CKTOTAL 42*   BNP (last 3 results) No results for input(s): PROBNP in the last 8760 hours. HbA1C: No results for input(s): HGBA1C in the last 72 hours. CBG: Recent Labs  Lab 10/25/19 1103 10/25/19 1617 10/25/19 2037 10/26/19 0930 10/26/19 1215  GLUCAP 166* 212* 155* 175* 192*   Lipid Profile: No results for input(s): CHOL, HDL, LDLCALC, TRIG, CHOLHDL, LDLDIRECT in the last 72 hours. Thyroid Function Tests: No results for input(s): TSH, T4TOTAL, FREET4, T3FREE, THYROIDAB in the last 72 hours. Anemia Panel: No results for input(s): VITAMINB12, FOLATE, FERRITIN, TIBC, IRON, RETICCTPCT in the last 72 hours. Sepsis Labs: No results for input(s): PROCALCITON, LATICACIDVEN in the last 168 hours.  Recent Results (from the past 240 hour(s))  SARS CORONAVIRUS 2 (TAT 6-24 HRS) Nasopharyngeal Nasopharyngeal Swab     Status: None   Collection Time: 10/18/19  2:49 PM   Specimen: Nasopharyngeal Swab  Result Value Ref Range Status   SARS Coronavirus 2 NEGATIVE NEGATIVE Final    Comment: (NOTE) SARS-CoV-2 target nucleic acids are NOT DETECTED. The SARS-CoV-2 RNA is generally detectable in upper and lower respiratory specimens during the acute phase of infection. Negative results do not preclude SARS-CoV-2 infection, do not rule out co-infections with other pathogens, and should not be used as the sole basis for treatment or other patient management decisions. Negative results must be combined with clinical observations, patient history, and epidemiological information. The expected result is Negative. Fact Sheet for Patients: SugarRoll.be Fact Sheet for Healthcare  Providers: https://www.woods-mathews.com/ This test is not yet approved or cleared by the [place] FDA and  has been authorized for detection and/or diagnosis of SARS-CoV-2 by FDA under an Emergency Use Authorization (EUA). This EUA will remain  in effect (meaning this test can be used) for the duration of the COVID-19 declaration under  Section 56 4(b)(1) of the Act, 21 U.S.C. section 360bbb-3(b)(1), unless the authorization is terminated or revoked sooner. Performed at [facility], [place] [address], [place], [place] [address]      Radiology Studies: IR Fluoro Guide CV Line Right  Result Date: 10/26/2019 CLINICAL DATA:  Renal failure and status post placement of tunneled hemodialysis catheter yesterday with subsequent pulling and complete dislodgement of the catheter by the patient due to confusion and combativeness. He requires another catheter to be placed for emergent hemodialysis. EXAM: TUNNELED CENTRAL VENOUS HEMODIALYSIS CATHETER PLACEMENT WITH ULTRASOUND AND FLUOROSCOPIC GUIDANCE ANESTHESIA/SEDATION: 0.5 mg IV Versed; 25 mcg IV Fentanyl. Total Moderate Sedation Time:   19 minutes. The patient's level of consciousness and physiologic status were continuously monitored during the procedure by Radiology nursing. MEDICATIONS: 2 g IV Ancef. FLUOROSCOPY TIME:  24 seconds.  3.2 mGy. PROCEDURE: The procedure, risks, benefits, and alternatives were explained to the patient. Questions regarding the procedure were encouraged and answered. The patient understands and consents to the procedure. A timeout was performed prior to initiating the procedure. The right neck and chest were prepped with chlorhexidine in a sterile fashion, and a sterile drape was applied covering the operative field. Maximum barrier sterile technique with sterile gowns and gloves were used for the procedure. Local anesthesia was provided with 1% lidocaine. Ultrasound was used to confirm patency of the right  internal jugular vein. After creating a small venotomy incision, a 21 gauge needle was advanced into the right internal jugular vein under direct, real-time ultrasound guidance. Ultrasound image documentation was performed. After securing guidewire access, an 8 Fr dilator was placed. A J-wire was kinked to measure appropriate catheter length. A Palindrome tunneled hemodialysis catheter measuring 19 cm from tip to cuff was chosen for placement. This was tunneled in a retrograde fashion from the chest wall to the venotomy incision. At the venotomy, serial dilatation was performed and a 16 Fr peel-away sheath was placed over a guidewire. The catheter was then placed through the sheath and the sheath removed. Final catheter positioning was confirmed and documented with a fluoroscopic spot image. The catheter was aspirated, flushed with saline, and injected with appropriate volume heparin dwells. The venotomy incision was closed with subcutaneous subcuticular 4-0 Vicryl. Dermabond was applied to the incision. The catheter exit site was secured with 0-Prolene retention sutures. COMPLICATIONS: None.  No pneumothorax. FINDINGS: After catheter placement, the tip lies in the right atrium. The catheter aspirates normally and is ready for immediate use. IMPRESSION: Placement of tunneled hemodialysis catheter via the right internal jugular vein. The catheter tip lies in the right atrium. The catheter is ready for immediate use. Electronically Signed   By: Aletta Edouard M.D.   On: 10/26/2019 11:14   IR Fluoro Guide CV Line Right  Result Date: 10/25/2019 INDICATION: 86 year old male in need secondary to acute kidney injury. to acute kidney injury. 86 year old male in need of hemodialysis of hemodialysis secondary EXAM: TUNNELED CENTRAL VENOUS HEMODIALYSIS CATHETER PLACEMENT WITH ULTRASOUND AND FLUOROSCOPIC GUIDANCE MEDICATIONS: 2 g Ancef 2 g Ancef. The antibiotic was given in an appropriate time interval prior to skin  puncture. ANESTHESIA/SEDATION: Moderate (conscious) sedation was employed during this procedure. A total of Versed 1 mg and Fentanyl 25 mcg was administered intravenously. Moderate Sedation Time: 14 minutes. The patient's level of consciousness and vital signs were monitored continuously by radiology nursing throughout the procedure under my direct supervision. FLUOROSCOPY TIME:  Fluoroscopy Time: 0 minutes 36 seconds (3 mGy). COMPLICATIONS: None immediate. PROCEDURE:  Informed written consent was obtained from the patient after a discussion of the risks, benefits, and alternatives to treatment. Questions regarding the procedure were encouraged and answered. The right neck and chest were prepped with chlorhexidine in a sterile fashion, and a sterile drape was applied covering the operative field. Maximum barrier sterile technique with sterile gowns and gloves were used for the procedure. A timeout was performed prior to the initiation of the procedure. After creating a small venotomy incision, a micropuncture kit was utilized to access the right internal jugular vein under direct, real-time ultrasound guidance after the overlying soft tissues were anesthetized with 1% lidocaine with epinephrine. Ultrasound image documentation was performed. The microwire was kinked to measure appropriate catheter length. A stiff Glidewire was advanced to the level of the IVC and the micropuncture sheath was exchanged for a peel-away sheath. A palindrome tunneled hemodialysis catheter measuring 19 cm from tip to cuff was tunneled in a retrograde fashion from the anterior chest wall to the venotomy incision. The catheter was then placed through the peel-away sheath with tips ultimately positioned within the superior aspect of the right atrium. Final catheter positioning was confirmed and documented with a spot radiographic image. The catheter aspirates and flushes normally. The catheter was flushed with appropriate volume heparin  dwells. The catheter exit site was secured with a 0-Prolene retention suture. The venotomy incision was closed with Dermabond. Dressings were applied. The patient tolerated the procedure well without immediate post procedural complication. IMPRESSION: Successful placement of 19 cm tip to cuff tunneled hemodialysis catheter via the right internal jugular vein with tips terminating within the superior aspect of the right atrium. The catheter is ready for immediate use. Electronically Signed   By: Jacqulynn Cadet M.D.   On: 10/25/2019 20:15   IR US Guide Vasc Access Right  Result Date: 10/26/2019 CLINICAL DATA:  Renal failure and status post placement of tunneled hemodialysis catheter yesterday with subsequent pulling and complete dislodgement of the catheter by the patient due to confusion and combativeness. He requires another catheter to be placed for emergent hemodialysis. EXAM: TUNNELED CENTRAL VENOUS HEMODIALYSIS CATHETER PLACEMENT WITH ULTRASOUND AND FLUOROSCOPIC GUIDANCE ANESTHESIA/SEDATION: 0.5 mg IV Versed; 25 mcg IV Fentanyl. Total Moderate Sedation Time:   19 minutes. The patient's level of consciousness and physiologic status were continuously monitored during the procedure by Radiology nursing. MEDICATIONS: 2 g IV Ancef. FLUOROSCOPY TIME:  24 seconds.  3.2 mGy. PROCEDURE: The procedure, risks, benefits, and alternatives were explained to the patient. Questions regarding the procedure were encouraged and answered. The patient understands and consents to the procedure. A timeout was performed prior to initiating the procedure. The right neck and chest were prepped with chlorhexidine in a sterile fashion, and a sterile drape was applied covering the operative field. Maximum barrier sterile technique with sterile gowns and gloves were used for the procedure. Local anesthesia was provided with 1% lidocaine. Ultrasound was used to confirm patency of the right internal jugular vein. After creating a small  venotomy incision, a 21 gauge needle was advanced into the right internal jugular vein under direct, real-time ultrasound guidance. Ultrasound image documentation was performed. After securing guidewire access, an 8 Fr dilator was placed. A J-wire was kinked to measure appropriate catheter length. A Palindrome tunneled hemodialysis catheter measuring 19 cm from tip to cuff was chosen for placement. This was tunneled in a retrograde fashion from the chest wall to the venotomy incision. At the venotomy, serial dilatation was performed and a 16 Fr peel-away sheath  was placed over a guidewire. The catheter was then placed through the sheath and the sheath removed. Final catheter positioning was confirmed and documented with a fluoroscopic spot image. The catheter was aspirated, flushed with saline, and injected with appropriate volume heparin dwells. The venotomy incision was closed with subcutaneous subcuticular 4-0 Vicryl. Dermabond was applied to the incision. The catheter exit site was secured with 0-Prolene retention sutures. COMPLICATIONS: None.  No pneumothorax. FINDINGS: After catheter placement, the tip lies in the right atrium. The catheter aspirates normally and is ready for immediate use. IMPRESSION: Placement of tunneled hemodialysis catheter via the right internal jugular vein. The catheter tip lies in the right atrium. The catheter is ready for immediate use. Electronically Signed   By: Aletta Edouard M.D.   On: 10/26/2019 11:14   IR US Guide Vasc Access Right  Result Date: 10/25/2019 INDICATION: 86 year old male in need secondary to acute kidney injury. to acute kidney injury. 86 year old male in need of hemodialysis of hemodialysis secondary EXAM: TUNNELED CENTRAL VENOUS HEMODIALYSIS CATHETER PLACEMENT WITH ULTRASOUND AND FLUOROSCOPIC GUIDANCE MEDICATIONS: 2 g Ancef 2 g Ancef. The antibiotic was given in an appropriate time interval prior to skin puncture. ANESTHESIA/SEDATION: Moderate (conscious)  sedation was employed during this procedure. A total of Versed 1 mg and Fentanyl 25 mcg was administered intravenously. Moderate Sedation Time: 14 minutes. The patient's level of consciousness and vital signs were monitored continuously by radiology nursing throughout the procedure under my direct supervision. FLUOROSCOPY TIME:  Fluoroscopy Time: 0 minutes 36 seconds (3 mGy). COMPLICATIONS: None immediate. PROCEDURE: Informed written consent was obtained from the patient after a discussion of the risks, benefits, and alternatives to treatment. Questions regarding the procedure were encouraged and answered. The right neck and chest were prepped with chlorhexidine in a sterile fashion, and a sterile drape was applied covering the operative field. Maximum barrier sterile technique with sterile gowns and gloves were used for the procedure. A timeout was performed prior to the initiation of the procedure. After creating a small venotomy incision, a micropuncture kit was utilized to access the right internal jugular vein under direct, real-time ultrasound guidance after the overlying soft tissues were anesthetized with 1% lidocaine with epinephrine. Ultrasound image documentation was performed. The microwire was kinked to measure appropriate catheter length. A stiff Glidewire was advanced to the level of the IVC and the micropuncture sheath was exchanged for a peel-away sheath. A palindrome tunneled hemodialysis catheter measuring 19 cm from tip to cuff was tunneled in a retrograde fashion from the anterior chest wall to the venotomy incision. The catheter was then placed through the peel-away sheath with tips ultimately positioned within the superior aspect of the right atrium. Final catheter positioning was confirmed and documented with a spot radiographic image. The catheter aspirates and flushes normally. The catheter was flushed with appropriate volume heparin dwells. The catheter exit site was secured with a  0-Prolene retention suture. The venotomy incision was closed with Dermabond. Dressings were applied. The patient tolerated the procedure well without immediate post procedural complication. IMPRESSION: Successful placement of 19 cm tip to cuff tunneled hemodialysis catheter via the right internal jugular vein with tips terminating within the superior aspect of the right atrium. The catheter is ready for immediate use. Electronically Signed   By: Jacqulynn Cadet M.D.   On: 10/25/2019 20:15   DG CHEST PORT 1 VIEW  Result Date: 10/25/2019 CLINICAL DATA:  Placement of hemodialysis catheter with subsequent removal EXAM: PORTABLE CHEST 1 VIEW COMPARISON:  05/22/2017, 10/25/2019  FINDINGS: No central venous catheter identified on the current image. Enlarged cardiomediastinal silhouette with vascular congestion and bilateral interstitial and ground-glass opacity suspect for edema. Probable left pleural effusion. Aortic atherosclerosis. No pneumothorax. IMPRESSION: Mild cardiomegaly with vascular congestion, and diffuse interstitial and ground-glass opacity, likely pulmonary edema with probable left pleural effusion Electronically Signed   By: Donavan Foil M.D.   On: 10/25/2019 19:18    Scheduled Meds: . sodium chloride   Intravenous Once  . aspirin EC  81 mg Oral Daily  . Chlorhexidine Gluconate Cloth  6 each Topical Daily  . Chlorhexidine Gluconate Cloth  6 each Topical Q0600  . feeding supplement (PRO-STAT SUGAR FREE 64)  30 mL Per Tube BID  . fentaNYL      . heparin      . insulin aspart  0-15 Units Subcutaneous TID WC  . insulin aspart  0-5 Units Subcutaneous QHS  . insulin glargine  12 Units Subcutaneous Daily  . lidocaine      . midazolam      . sodium chloride flush  10-40 mL Intracatheter Q12H  . sodium zirconium cyclosilicate  10 g Oral TID   Continuous Infusions: . sodium chloride    . sodium chloride    . ceFAZolin    . feeding supplement (OSMOLITE 1.5 CAL)    . heparin 1,650  Units/hr (10/26/19 0617)  .  sodium bicarbonate (isotonic) infusion in sterile water 100 mL/hr at 10/26/19 1200     LOS: 8 days   Darliss Cheney, MD [facility] Pager On Amion  If 7PM-7AM, please contact night-coverage 10/26/2019, 2:26 PM

## 2019-10-27 DIAGNOSIS — L8990 Pressure ulcer of unspecified site, unspecified stage: Secondary | ICD-10-CM | POA: Insufficient documentation

## 2019-10-27 LAB — RENAL FUNCTION PANEL
Albumin: 2.5 g/dL — ABNORMAL LOW (ref 3.5–5.0)
Anion gap: 19 — ABNORMAL HIGH (ref 5–15)
BUN: 98 mg/dL — ABNORMAL HIGH (ref 8–23)
CO2: 21 mmol/L — ABNORMAL LOW (ref 22–32)
Calcium: 7.2 mg/dL — ABNORMAL LOW (ref 8.9–10.3)
Chloride: 106 mmol/L (ref 98–111)
Creatinine, Ser: 6.11 mg/dL — ABNORMAL HIGH (ref 0.61–1.24)
GFR calc Af Amer: 9 mL/min — ABNORMAL LOW
GFR calc non Af Amer: 8 mL/min — ABNORMAL LOW
Glucose, Bld: 254 mg/dL — ABNORMAL HIGH (ref 70–99)
Phosphorus: 5.6 mg/dL — ABNORMAL HIGH (ref 2.5–4.6)
Potassium: 4.6 mmol/L (ref 3.5–5.1)
Sodium: 146 mmol/L — ABNORMAL HIGH (ref 135–145)

## 2019-10-27 LAB — CBC
HCT: 24.3 % — ABNORMAL LOW (ref 39.0–52.0)
Hemoglobin: 7.4 g/dL — ABNORMAL LOW (ref 13.0–17.0)
MCH: 31.2 pg (ref 26.0–34.0)
MCHC: 30.5 g/dL (ref 30.0–36.0)
MCV: 102.5 fL — ABNORMAL HIGH (ref 80.0–100.0)
Platelets: 95 10*3/uL — ABNORMAL LOW (ref 150–400)
RBC: 2.37 MIL/uL — ABNORMAL LOW (ref 4.22–5.81)
RDW: 20 % — ABNORMAL HIGH (ref 11.5–15.5)
WBC: 149.1 10*3/uL (ref 4.0–10.5)
nRBC: 0.1 % (ref 0.0–0.2)

## 2019-10-27 LAB — GLUCOSE, CAPILLARY
Glucose-Capillary: 252 mg/dL — ABNORMAL HIGH (ref 70–99)
Glucose-Capillary: 260 mg/dL — ABNORMAL HIGH (ref 70–99)
Glucose-Capillary: 262 mg/dL — ABNORMAL HIGH (ref 70–99)
Glucose-Capillary: 276 mg/dL — ABNORMAL HIGH (ref 70–99)
Glucose-Capillary: 301 mg/dL — ABNORMAL HIGH (ref 70–99)
Glucose-Capillary: 308 mg/dL — ABNORMAL HIGH (ref 70–99)

## 2019-10-27 LAB — HEPARIN LEVEL (UNFRACTIONATED): Heparin Unfractionated: 0.55 IU/mL (ref 0.30–0.70)

## 2019-10-27 MED ORDER — CHLORHEXIDINE GLUCONATE CLOTH 2 % EX PADS
6.0000 | MEDICATED_PAD | Freq: Every day | CUTANEOUS | Status: DC
  Administered 2019-10-27: 6 via TOPICAL

## 2019-10-27 MED ORDER — INSULIN GLARGINE 100 UNIT/ML ~~LOC~~ SOLN
20.0000 [IU] | Freq: Every day | SUBCUTANEOUS | Status: DC
  Administered 2019-10-27: 20 [IU] via SUBCUTANEOUS
  Filled 2019-10-27 (×2): qty 0.2

## 2019-10-27 MED ORDER — INSULIN ASPART 100 UNIT/ML ~~LOC~~ SOLN
0.0000 [IU] | SUBCUTANEOUS | Status: DC
  Administered 2019-10-27 (×2): 8 [IU] via SUBCUTANEOUS
  Administered 2019-10-27 – 2019-10-28 (×3): 11 [IU] via SUBCUTANEOUS
  Administered 2019-10-28: 17:00:00 5 [IU] via SUBCUTANEOUS
  Administered 2019-10-28: 12:00:00 11 [IU] via SUBCUTANEOUS
  Administered 2019-10-28: 5 [IU] via SUBCUTANEOUS
  Administered 2019-10-28: 8 [IU] via SUBCUTANEOUS
  Administered 2019-10-28: 5 [IU] via SUBCUTANEOUS
  Administered 2019-10-29: 09:00:00 8 [IU] via SUBCUTANEOUS
  Administered 2019-10-29 (×2): 3 [IU] via SUBCUTANEOUS
  Administered 2019-10-29: 01:00:00 11 [IU] via SUBCUTANEOUS
  Administered 2019-10-29: 18:00:00 3 [IU] via SUBCUTANEOUS
  Administered 2019-10-29: 05:00:00 11 [IU] via SUBCUTANEOUS
  Administered 2019-10-30: 05:00:00 2 [IU] via SUBCUTANEOUS
  Administered 2019-10-30: 3 [IU] via SUBCUTANEOUS
  Administered 2019-10-30: 12:00:00 2 [IU] via SUBCUTANEOUS
  Administered 2019-10-30: 3 [IU] via SUBCUTANEOUS
  Administered 2019-10-30 – 2019-10-31 (×2): 2 [IU] via SUBCUTANEOUS

## 2019-10-27 NOTE — Progress Notes (Addendum)
PROGRESS NOTE    Benjamin Watkins  MEQ:[id number] DOB: 1932/07/31 DOA: 10/18/2019 PCP: [name], MD    Brief Narrative:  86 y.o.malewith PMH of CLL, HTN, DM2. Patient was sent to ED from oncology office for abnormal labs including creatinine of 4.58.  Patient follows up with Dr. [name] for CLL. He was seen on 3/8, underwent blood work hemoglobin was 8.5, creatinine was 2.59. One of the CLL medications by the name of venetoclax was held.  He was started on oral prednisone for treatment of rash. Patient was asked to drink plenty water but apparently he was not able to drink enough water because he remained sleepy most of the hours in the day.  Repeat blood work was obtained on 3/23. Hemoglobin was noted to be low at 8.1, creatinine elevated to 4.58. Patient was admitted to hospitalist service for acute kidney injury on CKD stage 3.  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 -Baseline creatinine between 2-2.2 -Presented with a creatinine of 4.75, had been trending up to a peak of 8.30 on 3/31 despite IV hydration. -Nephrology following. Suspected drug-induced kidney injury secondary to [place], -patient was also on chlorthalidone, PPI. -Offending meds have been stopped. -Pt was transferred to [facility] to initiate HD.  -Pt s/p RIJ tunneled cath 3/30 by IR however patient pulled that out.  He had another right IJ tunnel catheter placement on 10/26/2019.  Received his first dialysis session on the evening of 10/26/2019.  Acute metabolic encephalopathy -Much better.  Much more alert and oriented x2 today.  This was likely due to uremia as he has turned around the corner after his dialysis yesterday.    CLL (chronic lymphocytic leukemia) -WBC  was 82.5 on admission, progressively increasing and now 135,000 today.  It was in 30-60 range in the past -Dr. Pietro Cassis discussed with oncologist Dr. [name].  He states that patient's CLL is not immediately life-threatening.  His treatment can be held till other medical conditions are stabilized.  -Venclexta has been on hold.  White blood cell up to 1 50,000 today.  I personally spoke to Dr. Velvet Bathe over the phone who once again was not much concerned about rising white blood cells and recommended to treat his other problems and he will see patient on Monday if he is still here.  He recommended to transfuse for anemia if indicated.  Anemia of chronic disease -Hb 8.1 on presentation. 1 unit of PRBC was given on the day of admission. Hemoglobin down to 7.4 today.  Will recheck in the morning.  Transfuse if less than 7.  DM (diabetes mellitus), type 2  -Home meds include glimepiride, ReliOn 70/30 insulin, 70 units twice a day -Insulin requirement is currently low because of poor creatinine clearance. -Currently on Lantus 12 units daily as well as sliding scale insulin.  Blood sugar now climbing.  Will increase Lantus to 20 units.  Continue SSI.  Essential hypertension: -Home meds include Coreg 25 mg twice daily and chlorthalidone.  Coreg was stopped on 10/25/2019 when he developed atrial fibrillation and was switched to amiodarone.  Blood pressure remains on the low side.  We will continue to hold that as well as chlorthalidone.  New onset atrial fibrillation with rapid  ventricular response: Late afternoon of 10/25/2019, patient developed new onset atrial fibrillation with rapid ventricular response.  Due to low blood pressure, he was started on amiodarone drip as well as heparin drip.  Overnight, he flipped into sinus rhythm.  His IV had infiltrated so amiodarone was not resumed.  Patient remained mostly in the normal sinus rhythm up until this afternoon when he went to atrial fibrillation with heart rates  between 120 and 140.  Consulted cardiology on 10/26/2019.  Currently he is only on heparin and amiodarone drip.  Appreciate cardiology help and management deferred to them.  DVT prophylaxis: Heparin drip Code Status: Full Family Communication: Pt in room, family not at bedside Disposition Plan: From home, plan home health PT or SNF when OK to d/c per nephrology and cardiology.  Consultants:   Nephrology  IR  Cardiology  Procedures:   Tunneled HD cath placed by IR 10/25/19  Repeat tunneled HD catheter placement by IR on 10/26/2019  Antimicrobials: Anti-infectives (From admission, onward)   Start     Dose/Rate Route Frequency Ordered Stop   10/26/19 0753  ceFAZolin (ANCEF) 2-4 GM/100ML-% IVPB    Note to Pharmacy: [name]   : cabinet override      10/26/19 0753 10/26/19 1959   10/25/19 1500  ceFAZolin (ANCEF) IVPB 2g/100 mL premix  Status:  Discontinued     2 g 200 mL/hr over 30 Minutes Intravenous To Radiology 10/24/19 1619 10/25/19 1916   10/25/19 1228  ceFAZolin (ANCEF) IVPB 1 g/50 mL premix     over 30 Minutes  Continuous PRN 10/25/19 1233 10/25/19 1228   10/25/19 1217  ceFAZolin (ANCEF) 2-4 GM/100ML-% IVPB    Note to Pharmacy: [name]   : cabinet override      10/25/19 1217 10/26/19 0029      Subjective: Patient seen and examined.  He is much more alert and oriented x2 today.  Denied any complaint.  Objective: Vitals:   10/27/19 0157 10/27/19 0544 10/27/19 0737 10/27/19 1146  BP:  (!) 100/42 (!) 90/53 (!) 104/48  Pulse: 85 64  82  Resp: 20 (!) 21  18  Temp:  98 F (36.7 C) (!) 97.5 F (36.4 C) (!) 97.5 F (36.4 C)  TempSrc:  Axillary Oral Oral  SpO2: 96% 92%  99%  Weight:  104 kg    Height:        Intake/Output Summary (Last 24 hours) at 10/27/2019 1314 Last data filed at 10/27/2019 0800 Gross per 24 hour  Intake 0 ml  Output 260 ml  Net -260 ml   Filed Weights   10/26/19 2010 10/26/19 2300 10/27/19 0544  Weight: 100.9 kg 100.9 kg 104 kg     Examination:  General exam: Appears calm and comfortable  Respiratory system: Diminished breath sounds due to poor inspiratory effort Cardiovascular system: S1 & S2 heard, RRR. No JVD, murmurs, rubs, gallops or clicks. No pedal edema. Gastrointestinal system: Abdomen is nondistended, soft and nontender. No organomegaly or masses felt. Normal bowel sounds heard. Central nervous system: Alert and oriented x2. No focal neurological deficits. Extremities: Symmetric 5 x 5 power. Skin: No rashes, lesions or ulcers.  Psychiatry: Judgement and insight appear normal. Mood & affect flat.   Data Reviewed: I have personally reviewed following labs and imaging studies  CBC: Recent Labs  Lab 10/22/19 0449 10/23/19 0351 10/24/19 0539 10/26/19 0508 10/27/19 0342  WBC 75.4* 83.1* 100.0* 135.4* 149.1*  NEUTROABS 6.2 6.6 7.1  --   --  HGB 8.1* 7.7* 8.9* 8.0* 7.4*  HCT 26.2* 24.8* 29.1* 26.0* 24.3*  MCV 104.8* 105.5* 105.8* 102.0* 102.5*  PLT 91* 92* 118* 114* 95*   Basic Metabolic Panel: Recent Labs  Lab 10/23/19 0351 10/23/19 0351 10/24/19 0539 10/26/19 0508 10/26/19 1436 10/26/19 1807 10/27/19 0342  NA 139   143   < > 145   142 142 147* 148* 146*  K 3.6   3.6   < > 4.9   4.6 6.5* 6.2* 6.6* 4.6  CL 110   112*   < > 112*   111 112* 112* 113* 106  CO2 18*   18*   < > 15*   16* 15* 17* 18* 21*  GLUCOSE 210*   212*   < > 225*   228* 171* 242* 223* 254*  BUN 95*   99*   < > 112*   111* 141* 146* 149* 98*  CREATININE 6.84*   6.91*   < > 7.56*   7.51* 8.30* 8.58* 8.61* 6.11*  CALCIUM 7.0*   7.1*   < > 7.9*   7.6* 7.0* 7.4* 7.1* 7.2*  MG  --   --   --  2.1  --   --   --   PHOS 4.7*  --  5.8* 6.6* 7.2*  --  5.6*   < > = values in this interval not displayed.   GFR: Estimated Creatinine Clearance: 11.6 mL/min (A) (by C-G formula based on SCr of 6.11 mg/dL (H)). Liver Function Tests: Recent Labs  Lab 10/23/19 0351 10/24/19 0539 10/26/19 0508 10/26/19 1436 10/27/19 0342  ALBUMIN  2.7* 3.0* 2.5* 2.8* 2.5*   No results for input(s): LIPASE, AMYLASE in the last 168 hours. No results for input(s): AMMONIA in the last 168 hours. Coagulation Profile: No results for input(s): INR, PROTIME in the last 168 hours. Cardiac Enzymes: Recent Labs  Lab 10/20/19 1348  CKTOTAL 42*   BNP (last 3 results) No results for input(s): PROBNP in the last 8760 hours. HbA1C: No results for input(s): HGBA1C in the last 72 hours. CBG: Recent Labs  Lab 10/26/19 1639 10/26/19 2320 10/27/19 0600 10/27/19 0815 10/27/19 1233  GLUCAP 199* 144* 260* 276* 308*   Lipid Profile: No results for input(s): CHOL, HDL, LDLCALC, TRIG, CHOLHDL, LDLDIRECT in the last 72 hours. Thyroid Function Tests: No results for input(s): TSH, T4TOTAL, FREET4, T3FREE, THYROIDAB in the last 72 hours. Anemia Panel: No results for input(s): VITAMINB12, FOLATE, FERRITIN, TIBC, IRON, RETICCTPCT in the last 72 hours. Sepsis Labs: No results for input(s): PROCALCITON, LATICACIDVEN in the last 168 hours.  Recent Results (from the past 240 hour(s))  SARS CORONAVIRUS 2 (TAT 6-24 HRS) Nasopharyngeal Nasopharyngeal Swab     Status: None   Collection Time: 10/18/19  2:49 PM   Specimen: Nasopharyngeal Swab  Result Value Ref Range Status   SARS Coronavirus 2 NEGATIVE NEGATIVE Final    Comment: (NOTE) SARS-CoV-2 target nucleic acids are NOT DETECTED. The SARS-CoV-2 RNA is generally detectable in upper and lower respiratory specimens during the acute phase of infection. Negative results do not preclude SARS-CoV-2 infection, do not rule out co-infections with other pathogens, and should not be used as the sole basis for treatment or other patient management decisions. Negative results must be combined with clinical observations, patient history, and epidemiological information. The expected result is Negative. Fact Sheet for Patients: SugarRoll.be Fact Sheet for Healthcare  Providers: https://www.woods-mathews.com/ This test is not yet approved or cleared by the [place] FDA  and  has been authorized for detection and/or diagnosis of SARS-CoV-2 by FDA under an Emergency Use Authorization (EUA). This EUA will remain  in effect (meaning this test can be used) for the duration of the COVID-19 declaration under Section 56 4(b)(1) of the Act, 21 U.S.C. section 360bbb-3(b)(1), unless the authorization is terminated or revoked sooner. Performed at [facility], [place] [address], [place], [place] [address]      Radiology Studies: IR Fluoro Guide CV Line Right  Result Date: 10/26/2019 CLINICAL DATA:  Renal failure and status post placement of tunneled hemodialysis catheter yesterday with subsequent pulling and complete dislodgement of the catheter by the patient due to confusion and combativeness. He requires another catheter to be placed for emergent hemodialysis. EXAM: TUNNELED CENTRAL VENOUS HEMODIALYSIS CATHETER PLACEMENT WITH ULTRASOUND AND FLUOROSCOPIC GUIDANCE ANESTHESIA/SEDATION: 0.5 mg IV Versed; 25 mcg IV Fentanyl. Total Moderate Sedation Time:   19 minutes. The patient's level of consciousness and physiologic status were continuously monitored during the procedure by Radiology nursing. MEDICATIONS: 2 g IV Ancef. FLUOROSCOPY TIME:  24 seconds.  3.2 mGy. PROCEDURE: The procedure, risks, benefits, and alternatives were explained to the patient. Questions regarding the procedure were encouraged and answered. The patient understands and consents to the procedure. A timeout was performed prior to initiating the procedure. The right neck and chest were prepped with chlorhexidine in a sterile fashion, and a sterile drape was applied covering the operative field. Maximum barrier sterile technique with sterile gowns and gloves were used for the procedure. Local anesthesia was provided with 1% lidocaine. Ultrasound was used to confirm patency of the right  internal jugular vein. After creating a small venotomy incision, a 21 gauge needle was advanced into the right internal jugular vein under direct, real-time ultrasound guidance. Ultrasound image documentation was performed. After securing guidewire access, an 8 Fr dilator was placed. A J-wire was kinked to measure appropriate catheter length. A Palindrome tunneled hemodialysis catheter measuring 19 cm from tip to cuff was chosen for placement. This was tunneled in a retrograde fashion from the chest wall to the venotomy incision. At the venotomy, serial dilatation was performed and a 16 Fr peel-away sheath was placed over a guidewire. The catheter was then placed through the sheath and the sheath removed. Final catheter positioning was confirmed and documented with a fluoroscopic spot image. The catheter was aspirated, flushed with saline, and injected with appropriate volume heparin dwells. The venotomy incision was closed with subcutaneous subcuticular 4-0 Vicryl. Dermabond was applied to the incision. The catheter exit site was secured with 0-Prolene retention sutures. COMPLICATIONS: None.  No pneumothorax. FINDINGS: After catheter placement, the tip lies in the right atrium. The catheter aspirates normally and is ready for immediate use. IMPRESSION: Placement of tunneled hemodialysis catheter via the right internal jugular vein. The catheter tip lies in the right atrium. The catheter is ready for immediate use. Electronically Signed   By: Aletta Edouard M.D.   On: 10/26/2019 11:14   IR US Guide Vasc Access Right  Result Date: 10/26/2019 CLINICAL DATA:  Renal failure and status post placement of tunneled hemodialysis catheter yesterday with subsequent pulling and complete dislodgement of the catheter by the patient due to confusion and combativeness. He requires another catheter to be placed for emergent hemodialysis. EXAM: TUNNELED CENTRAL VENOUS HEMODIALYSIS CATHETER PLACEMENT WITH ULTRASOUND AND  FLUOROSCOPIC GUIDANCE ANESTHESIA/SEDATION: 0.5 mg IV Versed; 25 mcg IV Fentanyl. Total Moderate Sedation Time:   19 minutes. The patient's level of consciousness and physiologic status  were continuously monitored during the procedure by Radiology nursing. MEDICATIONS: 2 g IV Ancef. FLUOROSCOPY TIME:  24 seconds.  3.2 mGy. PROCEDURE: The procedure, risks, benefits, and alternatives were explained to the patient. Questions regarding the procedure were encouraged and answered. The patient understands and consents to the procedure. A timeout was performed prior to initiating the procedure. The right neck and chest were prepped with chlorhexidine in a sterile fashion, and a sterile drape was applied covering the operative field. Maximum barrier sterile technique with sterile gowns and gloves were used for the procedure. Local anesthesia was provided with 1% lidocaine. Ultrasound was used to confirm patency of the right internal jugular vein. After creating a small venotomy incision, a 21 gauge needle was advanced into the right internal jugular vein under direct, real-time ultrasound guidance. Ultrasound image documentation was performed. After securing guidewire access, an 8 Fr dilator was placed. A J-wire was kinked to measure appropriate catheter length. A Palindrome tunneled hemodialysis catheter measuring 19 cm from tip to cuff was chosen for placement. This was tunneled in a retrograde fashion from the chest wall to the venotomy incision. At the venotomy, serial dilatation was performed and a 16 Fr peel-away sheath was placed over a guidewire. The catheter was then placed through the sheath and the sheath removed. Final catheter positioning was confirmed and documented with a fluoroscopic spot image. The catheter was aspirated, flushed with saline, and injected with appropriate volume heparin dwells. The venotomy incision was closed with subcutaneous subcuticular 4-0 Vicryl. Dermabond was applied to the incision.  The catheter exit site was secured with 0-Prolene retention sutures. COMPLICATIONS: None.  No pneumothorax. FINDINGS: After catheter placement, the tip lies in the right atrium. The catheter aspirates normally and is ready for immediate use. IMPRESSION: Placement of tunneled hemodialysis catheter via the right internal jugular vein. The catheter tip lies in the right atrium. The catheter is ready for immediate use. Electronically Signed   By: Aletta Edouard M.D.   On: 10/26/2019 11:14   DG CHEST PORT 1 VIEW  Result Date: 10/25/2019 CLINICAL DATA:  Placement of hemodialysis catheter with subsequent removal EXAM: PORTABLE CHEST 1 VIEW COMPARISON:  05/22/2017, 10/25/2019 FINDINGS: No central venous catheter identified on the current image. Enlarged cardiomediastinal silhouette with vascular congestion and bilateral interstitial and ground-glass opacity suspect for edema. Probable left pleural effusion. Aortic atherosclerosis. No pneumothorax. IMPRESSION: Mild cardiomegaly with vascular congestion, and diffuse interstitial and ground-glass opacity, likely pulmonary edema with probable left pleural effusion Electronically Signed   By: Donavan Foil M.D.   On: 10/25/2019 19:18   ECHOCARDIOGRAM COMPLETE  Result Date: 10/26/2019    ECHOCARDIOGRAM REPORT   Patient Name:   KAVARI PARRILLO Date of Exam: 10/26/2019 Medical Rec #:  [id number]         Height:       74.0 in Accession #:    [id number]        Weight:       222.2 lb Date of Birth:  Jun 14, 1933         BSA:          2.273 m Patient Age:    30 years          BP:           98/53 mmHg Patient Gender: M                 HR:           99 bpm. Exam Location:  Inpatient  Procedure: 2D Echo, Color Doppler, Cardiac Doppler and Intracardiac            Opacification Agent Indications:    I48.91* Unspecified atrial fibrillation  History:        Patient has no prior history of Echocardiogram examinations.                 Risk Factors:Hypertension and Diabetes.  Sonographer:     Raquel Sarna Senior RDCS Referring Phys: [place]  Sonographer Comments: Technically difficult due to poor echo windows. IMPRESSIONS  1. Left ventricular ejection fraction, by estimation, is 30 to 35%. The left ventricle has moderately decreased function. The left ventricle demonstrates regional wall motion abnormalities (see scoring diagram/findings for description). The left ventricular internal cavity size was moderately dilated. Left ventricular diastolic parameters are indeterminate. There is severe hypokinesis to akinesis of all apical segments, to the mid wall level. Function at the basal segments is only mildly hypokinetic. This may be consistent with a stress induced (takotsubo) cardiomyopathy, though multivessel CAD cannot be excluded. LV thrombus excluded by contrast.  2. Right ventricular systolic function is mildly reduced. The right ventricular size is mildly enlarged. There is mildly elevated pulmonary artery systolic pressure.  3. Left atrial size was moderately dilated.  4. Right atrial size was moderately dilated.  5. The mitral valve is grossly normal. Mild mitral valve regurgitation. No evidence of mitral stenosis.  6. The aortic valve has an indeterminant number of cusps. Aortic valve regurgitation is trivial. Mild to moderate aortic valve sclerosis/calcification is present, without any evidence of aortic stenosis. Comparison(s): No prior Echocardiogram. FINDINGS  Left Ventricle: Left ventricular ejection fraction, by estimation, is 30 to 35%. The left ventricle has moderately decreased function. The left ventricle demonstrates regional wall motion abnormalities. Definity contrast agent was given IV to delineate the left ventricular endocardial borders. The left ventricular internal cavity size was moderately dilated. There is no left ventricular hypertrophy. Left ventricular diastolic parameters are indeterminate.  LV Wall Scoring: The mid and distal anterior wall, mid and distal lateral  wall, mid and distal anterior septum, entire apex, mid and distal inferior wall, mid anterolateral segment, and mid inferoseptal segment are akinetic. The basal anteroseptal segment, basal inferolateral segment, basal anterolateral segment, basal anterior segment, basal inferior segment, and basal inferoseptal segment are hypokinetic. There is severe hypokinesis to akinesis of all apical segments, to the mid wall level. Function at the basal segments is only mildly hypokinetic. This may be consistent with a stress induced (takotsubo) cardiomyopathy, though multivessel CAD cannot be excluded. LV thrombus excluded by contrast. Right Ventricle: The right ventricular size is mildly enlarged. Right vetricular wall thickness was not assessed. Right ventricular systolic function is mildly reduced. There is mildly elevated pulmonary artery systolic pressure. The tricuspid regurgitant velocity is 2.57 m/s, and with an assumed right atrial pressure of 8 mmHg, the estimated right ventricular systolic pressure is 53.6 mmHg. Left Atrium: Left atrial size was moderately dilated. Right Atrium: Right atrial size was moderately dilated. Pericardium: Trivial pericardial effusion is present. Mitral Valve: The mitral valve is grossly normal. There is mild thickening of the mitral valve leaflet(s). There is mild calcification of the mitral valve leaflet(s). Mild mitral annular calcification. Mild mitral valve regurgitation. No evidence of mitral valve stenosis. Tricuspid Valve: The tricuspid valve is normal in structure. Tricuspid valve regurgitation is mild . No evidence of tricuspid stenosis. Aortic Valve: The aortic valve has an indeterminant number of cusps. . There is moderate thickening and moderate  calcification of the aortic valve. Aortic valve regurgitation is trivial. Mild to moderate aortic valve sclerosis/calcification is present, without any evidence of aortic stenosis. There is moderate thickening of the aortic valve.  There is moderate calcification of the aortic valve. Pulmonic Valve: The pulmonic valve was not well visualized. Pulmonic valve regurgitation is not visualized. No evidence of pulmonic stenosis. Aorta: The aortic root and ascending aorta are structurally normal, with no evidence of dilitation. Pulmonary Artery: The pulmonary artery is not well seen. Venous: The inferior vena cava was not well visualized. IAS/Shunts: No atrial level shunt detected by color flow Doppler.  LEFT VENTRICLE PLAX 2D LVIDd:         5.40 cm  Diastology LVIDs:         4.30 cm  LV e' lateral: 7.18 cm/s LV PW:         1.10 cm  LV e' medial:  2.50 cm/s LV IVS:        1.10 cm LVOT diam:     2.10 cm LV SV:         53 LV SV Index:   23 LVOT Area:     3.46 cm  RIGHT VENTRICLE RV S prime:     7.62 cm/s TAPSE (M-mode): 1.6 cm LEFT ATRIUM              Index       RIGHT ATRIUM           Index LA diam:        3.60 cm  1.58 cm/m  RA Area:     28.10 cm LA Vol (A2C):   94.2 ml  41.44 ml/m RA Volume:   95.30 ml  41.93 ml/m LA Vol (A4C):   102.0 ml 44.87 ml/m LA Biplane Vol: 102.0 ml 44.87 ml/m  AORTIC VALVE LVOT Vmax:   77.40 cm/s LVOT Vmean:  56.700 cm/s LVOT VTI:    0.153 m  AORTA Ao Root diam: 3.60 cm TRICUSPID VALVE TR Peak grad:   26.4 mmHg TR Vmax:        257.00 cm/s  SHUNTS Systemic VTI:  0.15 m Systemic Diam: 2.10 cm Buford Dresser MD Electronically signed by Buford Dresser MD Signature Date/Time: 10/26/2019/3:28:10 PM    Final     Scheduled Meds:  aspirin EC  81 mg Oral Daily   Chlorhexidine Gluconate Cloth  6 each Topical Q0600   Chlorhexidine Gluconate Cloth  6 each Topical Q0600   digoxin  0.25 mg Intravenous Once   feeding supplement (PRO-STAT SUGAR FREE 64)  30 mL Per Tube BID   insulin aspart  0-15 Units Subcutaneous Q4H   insulin glargine  20 Units Subcutaneous Daily   sodium chloride flush  10-40 mL Intracatheter Q12H   Continuous Infusions:  sodium chloride     sodium chloride     amiodarone 30  mg/hr (10/27/19 0515)   feeding supplement (OSMOLITE 1.5 CAL) 1,000 mL (10/27/19 0010)   heparin 1,650 Units/hr (10/27/19 0520)     LOS: 9 days   Total time spent 30 minutes Darliss Cheney, MD [facility] Pager On Amion  If 7PM-7AM, please contact night-coverage 10/27/2019, 1:14 PM

## 2019-10-27 NOTE — Progress Notes (Signed)
[facility] for Heparin Indication: atrial fibrillation  Allergies  Allergen Reactions  . Hydrochlorothiazide Other (See Comments)    "It just bothers me"  . Lisinopril Other (See Comments)    "Takes away energy"    Patient Measurements: Height: 6\' 2"  (188 cm) Weight: 229 lb 4.5 oz (104 kg) IBW/kg (Calculated) : 82.2 Heparin Dosing Weight: 103 kg  Vital Signs: Temp: 97.5 F (36.4 C) (04/01 0737) Temp Source: Oral (04/01 0737) BP: 90/53 (04/01 0737) Pulse Rate: 64 (04/01 0544)  Labs: Recent Labs    10/26/19 0508 10/26/19 0508 10/26/19 1436 10/26/19 1807 10/27/19 0342  HGB 8.0*  --   --   --  7.4*  HCT 26.0*  --   --   --  24.3*  PLT 114*  --   --   --  95*  HEPARINUNFRC 0.18*  --   --  0.44 0.55  CREATININE 8.30*   < > 8.58* 8.61* 6.11*   < > = values in this interval not displayed.    Estimated Creatinine Clearance: 11.6 mL/min (A) (by C-G formula based on SCr of 6.11 mg/dL (H)).   Medical History: Past Medical History:  Diagnosis Date  . CLL (chronic lymphocytic leukemia) ([place])   . Diabetes mellitus without complication ([place])   . Diverticulosis   . History of colon polyps   . Hypertension   . Low back pain   . Right foot drop     Assessment: 86 yr old male admitted on 10/18/19 with AKI/dehydration, now with a fib with RVR. Pharmacy was consulted to dose heparin. Pt was on no anticoagulants PTA.   Heparin continues to be at goal this morning. hgb down slightly from 8>7.4, platelets 95. Per RN, no issues with IV or bleeding observed.  Goal of Therapy:  Heparin level 0.3-0.7 units/ml Monitor platelets by anticoagulation protocol: Yes   Plan:  Continue heparin infusion at 1650 units/hr Monitor daily heparin level, CBC Monitor for signs/symptoms of bleeding   Erin Hearing PharmD., BCPS Clinical Pharmacist 10/27/2019 7:58 AM

## 2019-10-27 NOTE — Progress Notes (Signed)
[facility] ROUNDING NOTE   Subjective:   This is an 86-year gentleman with a history of CLL with acute on chronic kidney disease stage III.  He was admitted from the oncology office for abnormal labs including a creatinine of 4.58.  On 10/03/2019 blood work showed a creatinine of 2.59.  At that time he also had a rash having recently had venetoclax for treatment of his CLL.  He was started on oral steroids.  In addition to this the patient was also on chlorthalidone and PPI all of which could have contributed to the rash.  Is acute on chronic kidney failure was thought to be secondary to either acute interstitial nephritis or acute tubular necrosis.  He was transferred from [facility] 10/24/2019 to [facility] for initiation of dialysis.  It was thought that his fluctuating mental status was secondary to uremia.  Patient has become increasingly more confused.  He pulled out his  dialysis cath 10/24/2029.   He had another dialysis catheter placed 10/27/2019 and received dialysis 10/27/2019.  Scheduled for another dialysis treatment 10/28/2019.  Blood pressure 108/37 pulse 96 temperature 97.5 O2 sats 94% room air  Sodium 146 potassium 4.6 chloride 106 CO2 21 BUN 98 creatinine 6.1 glucose 254 phosphorus of 5.6 calcium 7.2 albumin 2.5 WBC 149 hemoglobin 7.4 platelets 95  Aspirin 81 mg daily, insulin sliding scale, amiodarone IV 30 mg/h  IV sodium bicarbonate   Objective:  Vital signs in last 24 hours:  Temp:  [97.5 F (36.4 C)-98.1 F (36.7 C)] 97.5 F (36.4 C) (04/01 0737) Pulse Rate:  [33-162] 64 (04/01 0544) Resp:  [0-26] 21 (04/01 0544) BP: (56-117)/(29-93) 90/53 (04/01 0737) SpO2:  [92 %-100 %] 92 % (04/01 0544) Weight:  [100.9 kg-104 kg] 104 kg (04/01 0544)  Weight change: 0.1 kg Filed Weights   10/26/19 2010 10/26/19 2300 10/27/19 0544  Weight: 100.9 kg 100.9 kg 104 kg    Intake/Output: I/O last 3 completed shifts: In: 597.9 [I.V.:97.9; IV  Piggyback:500] Out: 260 [Urine:250; Other:10]   Intake/Output this shift:  Total I/O In: -  Out: 250 [Urine:250]  Elderly male nondistressed  CVS- RRR no rubs RS-clear bilaterally ABD- BS present soft non-distended Foley catheter EXT- no edema   Basic Metabolic Panel: Recent Labs  Lab 10/23/19 0351 10/23/19 0351 10/24/19 0539 10/24/19 0539 10/26/19 0508 10/26/19 0508 10/26/19 1436 10/26/19 1807 10/27/19 0342  NA 139  143   < > 145  142  --  142  --  147* 148* 146*  K 3.6  3.6   < > 4.9  4.6  --  6.5*  --  6.2* 6.6* 4.6  CL 110  112*   < > 112*  111  --  112*  --  112* 113* 106  CO2 18*  18*   < > 15*  16*  --  15*  --  17* 18* 21*  GLUCOSE 210*  212*   < > 225*  228*  --  171*  --  242* 223* 254*  BUN 95*  99*   < > 112*  111*  --  141*  --  146* 149* 98*  CREATININE 6.84*  6.91*   < > 7.56*  7.51*  --  8.30*  --  8.58* 8.61* 6.11*  CALCIUM 7.0*  7.1*   < > 7.9*  7.6*   < > 7.0*   < > 7.4* 7.1* 7.2*  MG  --   --   --   --    2.1  --   --   --   --   PHOS 4.7*  --  5.8*  --  6.6*  --  7.2*  --  5.6*   < > = values in this interval not displayed.    Liver Function Tests: Recent Labs  Lab 10/23/19 0351 10/24/19 0539 10/26/19 0508 10/26/19 1436 10/27/19 0342  ALBUMIN 2.7* 3.0* 2.5* 2.8* 2.5*   No results for input(s): LIPASE, AMYLASE in the last 168 hours. No results for input(s): AMMONIA in the last 168 hours.  CBC: Recent Labs  Lab 10/22/19 0449 10/23/19 0351 10/24/19 0539 10/26/19 0508 10/27/19 0342  WBC 75.4* 83.1* 100.0* 135.4* 149.1*  NEUTROABS 6.2 6.6 7.1  --   --   HGB 8.1* 7.7* 8.9* 8.0* 7.4*  HCT 26.2* 24.8* 29.1* 26.0* 24.3*  MCV 104.8* 105.5* 105.8* 102.0* 102.5*  PLT 91* 92* 118* 114* 95*    Cardiac Enzymes: Recent Labs  Lab 10/20/19 1348  CKTOTAL 42*    BNP: Invalid input(s): POCBNP  CBG: Recent Labs  Lab 10/26/19 1215 10/26/19 1639 10/26/19 2320 10/27/19 0600 10/27/19 0815  GLUCAP 192* 199* 144* 260* 276*     Microbiology: Results for orders placed or performed during the hospital encounter of 10/18/19  SARS CORONAVIRUS 2 (TAT 6-24 HRS) Nasopharyngeal Nasopharyngeal Swab     Status: None   Collection Time: 10/18/19  2:49 PM   Specimen: Nasopharyngeal Swab  Result Value Ref Range Status   SARS Coronavirus 2 NEGATIVE NEGATIVE Final    Comment: (NOTE) SARS-CoV-2 target nucleic acids are NOT DETECTED. The SARS-CoV-2 RNA is generally detectable in upper and lower respiratory specimens during the acute phase of infection. Negative results do not preclude SARS-CoV-2 infection, do not rule out co-infections with other pathogens, and should not be used as the sole basis for treatment or other patient management decisions. Negative results must be combined with clinical observations, patient history, and epidemiological information. The expected result is Negative. Fact Sheet for Patients: https://www.fda.gov/media/138098/download Fact Sheet for Healthcare Providers: https://www.fda.gov/media/138095/download This test is not yet approved or cleared by the [place] FDA and  has been authorized for detection and/or diagnosis of SARS-CoV-2 by FDA under an Emergency Use Authorization (EUA). This EUA will remain  in effect (meaning this test can be used) for the duration of the COVID-19 declaration under Section 56 4(b)(1) of the Act, 21 U.S.C. section 360bbb-3(b)(1), unless the authorization is terminated or revoked sooner. Performed at [facility], [address] [place], [place] [address]     Coagulation Studies: No results for input(s): LABPROT, INR in the last 72 hours.  Urinalysis: No results for input(s): COLORURINE, LABSPEC, PHURINE, GLUCOSEU, HGBUR, BILIRUBINUR, KETONESUR, PROTEINUR, UROBILINOGEN, NITRITE, LEUKOCYTESUR in the last 72 hours.  Invalid input(s): APPERANCEUR    Imaging: IR Fluoro Guide CV Line Right  Result Date: 10/26/2019 CLINICAL DATA:  Renal  failure and status post placement of tunneled hemodialysis catheter yesterday with subsequent pulling and complete dislodgement of the catheter by the patient due to confusion and combativeness. He requires another catheter to be placed for emergent hemodialysis. EXAM: TUNNELED CENTRAL VENOUS HEMODIALYSIS CATHETER PLACEMENT WITH ULTRASOUND AND FLUOROSCOPIC GUIDANCE ANESTHESIA/SEDATION: 0.5 mg IV Versed; 25 mcg IV Fentanyl. Total Moderate Sedation Time:   19 minutes. The patient's level of consciousness and physiologic status were continuously monitored during the procedure by Radiology nursing. MEDICATIONS: 2 g IV Ancef. FLUOROSCOPY TIME:  24 seconds.  3.2 mGy. PROCEDURE: The procedure, risks, benefits, and alternatives were explained   to the patient. Questions regarding the procedure were encouraged and answered. The patient understands and consents to the procedure. A timeout was performed prior to initiating the procedure. The right neck and chest were prepped with chlorhexidine in a sterile fashion, and a sterile drape was applied covering the operative field. Maximum barrier sterile technique with sterile gowns and gloves were used for the procedure. Local anesthesia was provided with 1% lidocaine. Ultrasound was used to confirm patency of the right internal jugular vein. After creating a small venotomy incision, a 21 gauge needle was advanced into the right internal jugular vein under direct, real-time ultrasound guidance. Ultrasound image documentation was performed. After securing guidewire access, an 8 Fr dilator was placed. A J-wire was kinked to measure appropriate catheter length. A Palindrome tunneled hemodialysis catheter measuring 19 cm from tip to cuff was chosen for placement. This was tunneled in a retrograde fashion from the chest wall to the venotomy incision. At the venotomy, serial dilatation was performed and a 16 Fr peel-away sheath was placed over a guidewire. The catheter was then placed  through the sheath and the sheath removed. Final catheter positioning was confirmed and documented with a fluoroscopic spot image. The catheter was aspirated, flushed with saline, and injected with appropriate volume heparin dwells. The venotomy incision was closed with subcutaneous subcuticular 4-0 Vicryl. Dermabond was applied to the incision. The catheter exit site was secured with 0-Prolene retention sutures. COMPLICATIONS: None.  No pneumothorax. FINDINGS: After catheter placement, the tip lies in the right atrium. The catheter aspirates normally and is ready for immediate use. IMPRESSION: Placement of tunneled hemodialysis catheter via the right internal jugular vein. The catheter tip lies in the right atrium. The catheter is ready for immediate use. Electronically Signed   By: Glenn  Yamagata M.D.   On: 10/26/2019 11:14   IR Fluoro Guide CV Line Right  Result Date: 10/25/2019 INDICATION: 86-year-old male in need secondary to acute kidney injury. to acute kidney injury. 86-year-old male in need of hemodialysis of hemodialysis secondary EXAM: TUNNELED CENTRAL VENOUS HEMODIALYSIS CATHETER PLACEMENT WITH ULTRASOUND AND FLUOROSCOPIC GUIDANCE MEDICATIONS: 2 g Ancef 2 g Ancef. The antibiotic was given in an appropriate time interval prior to skin puncture. ANESTHESIA/SEDATION: Moderate (conscious) sedation was employed during this procedure. A total of Versed 1 mg and Fentanyl 25 mcg was administered intravenously. Moderate Sedation Time: 14 minutes. The patient's level of consciousness and vital signs were monitored continuously by radiology nursing throughout the procedure under my direct supervision. FLUOROSCOPY TIME:  Fluoroscopy Time: 0 minutes 36 seconds (3 mGy). COMPLICATIONS: None immediate. PROCEDURE: Informed written consent was obtained from the patient after a discussion of the risks, benefits, and alternatives to treatment. Questions regarding the procedure were encouraged and answered. The right  neck and chest were prepped with chlorhexidine in a sterile fashion, and a sterile drape was applied covering the operative field. Maximum barrier sterile technique with sterile gowns and gloves were used for the procedure. A timeout was performed prior to the initiation of the procedure. After creating a small venotomy incision, a micropuncture kit was utilized to access the right internal jugular vein under direct, real-time ultrasound guidance after the overlying soft tissues were anesthetized with 1% lidocaine with epinephrine. Ultrasound image documentation was performed. The microwire was kinked to measure appropriate catheter length. A stiff Glidewire was advanced to the level of the IVC and the micropuncture sheath was exchanged for a peel-away sheath. A palindrome tunneled hemodialysis catheter measuring 19 cm from tip to cuff   was tunneled in a retrograde fashion from the anterior chest wall to the venotomy incision. The catheter was then placed through the peel-away sheath with tips ultimately positioned within the superior aspect of the right atrium. Final catheter positioning was confirmed and documented with a spot radiographic image. The catheter aspirates and flushes normally. The catheter was flushed with appropriate volume heparin dwells. The catheter exit site was secured with a 0-Prolene retention suture. The venotomy incision was closed with Dermabond. Dressings were applied. The patient tolerated the procedure well without immediate post procedural complication. IMPRESSION: Successful placement of 19 cm tip to cuff tunneled hemodialysis catheter via the right internal jugular vein with tips terminating within the superior aspect of the right atrium. The catheter is ready for immediate use. Electronically Signed   By: Jacqulynn Cadet M.D.   On: 10/25/2019 20:15   IR US Guide Vasc Access Right  Result Date: 10/26/2019 CLINICAL DATA:  Renal failure and status post placement of tunneled  hemodialysis catheter yesterday with subsequent pulling and complete dislodgement of the catheter by the patient due to confusion and combativeness. He requires another catheter to be placed for emergent hemodialysis. EXAM: TUNNELED CENTRAL VENOUS HEMODIALYSIS CATHETER PLACEMENT WITH ULTRASOUND AND FLUOROSCOPIC GUIDANCE ANESTHESIA/SEDATION: 0.5 mg IV Versed; 25 mcg IV Fentanyl. Total Moderate Sedation Time:   19 minutes. The patient's level of consciousness and physiologic status were continuously monitored during the procedure by Radiology nursing. MEDICATIONS: 2 g IV Ancef. FLUOROSCOPY TIME:  24 seconds.  3.2 mGy. PROCEDURE: The procedure, risks, benefits, and alternatives were explained to the patient. Questions regarding the procedure were encouraged and answered. The patient understands and consents to the procedure. A timeout was performed prior to initiating the procedure. The right neck and chest were prepped with chlorhexidine in a sterile fashion, and a sterile drape was applied covering the operative field. Maximum barrier sterile technique with sterile gowns and gloves were used for the procedure. Local anesthesia was provided with 1% lidocaine. Ultrasound was used to confirm patency of the right internal jugular vein. After creating a small venotomy incision, a 21 gauge needle was advanced into the right internal jugular vein under direct, real-time ultrasound guidance. Ultrasound image documentation was performed. After securing guidewire access, an 8 Fr dilator was placed. A J-wire was kinked to measure appropriate catheter length. A Palindrome tunneled hemodialysis catheter measuring 19 cm from tip to cuff was chosen for placement. This was tunneled in a retrograde fashion from the chest wall to the venotomy incision. At the venotomy, serial dilatation was performed and a 16 Fr peel-away sheath was placed over a guidewire. The catheter was then placed through the sheath and the sheath removed. Final  catheter positioning was confirmed and documented with a fluoroscopic spot image. The catheter was aspirated, flushed with saline, and injected with appropriate volume heparin dwells. The venotomy incision was closed with subcutaneous subcuticular 4-0 Vicryl. Dermabond was applied to the incision. The catheter exit site was secured with 0-Prolene retention sutures. COMPLICATIONS: None.  No pneumothorax. FINDINGS: After catheter placement, the tip lies in the right atrium. The catheter aspirates normally and is ready for immediate use. IMPRESSION: Placement of tunneled hemodialysis catheter via the right internal jugular vein. The catheter tip lies in the right atrium. The catheter is ready for immediate use. Electronically Signed   By: Aletta Edouard M.D.   On: 10/26/2019 11:14   IR US Guide Vasc Access Right  Result Date: 10/25/2019 INDICATION: 86 year old male in need secondary to acute kidney  injury. to acute kidney injury. 86 year old male in need of hemodialysis of hemodialysis secondary EXAM: TUNNELED CENTRAL VENOUS HEMODIALYSIS CATHETER PLACEMENT WITH ULTRASOUND AND FLUOROSCOPIC GUIDANCE MEDICATIONS: 2 g Ancef 2 g Ancef. The antibiotic was given in an appropriate time interval prior to skin puncture. ANESTHESIA/SEDATION: Moderate (conscious) sedation was employed during this procedure. A total of Versed 1 mg and Fentanyl 25 mcg was administered intravenously. Moderate Sedation Time: 14 minutes. The patient's level of consciousness and vital signs were monitored continuously by radiology nursing throughout the procedure under my direct supervision. FLUOROSCOPY TIME:  Fluoroscopy Time: 0 minutes 36 seconds (3 mGy). COMPLICATIONS: None immediate. PROCEDURE: Informed written consent was obtained from the patient after a discussion of the risks, benefits, and alternatives to treatment. Questions regarding the procedure were encouraged and answered. The right neck and chest were prepped with chlorhexidine in a  sterile fashion, and a sterile drape was applied covering the operative field. Maximum barrier sterile technique with sterile gowns and gloves were used for the procedure. A timeout was performed prior to the initiation of the procedure. After creating a small venotomy incision, a micropuncture kit was utilized to access the right internal jugular vein under direct, real-time ultrasound guidance after the overlying soft tissues were anesthetized with 1% lidocaine with epinephrine. Ultrasound image documentation was performed. The microwire was kinked to measure appropriate catheter length. A stiff Glidewire was advanced to the level of the IVC and the micropuncture sheath was exchanged for a peel-away sheath. A palindrome tunneled hemodialysis catheter measuring 19 cm from tip to cuff was tunneled in a retrograde fashion from the anterior chest wall to the venotomy incision. The catheter was then placed through the peel-away sheath with tips ultimately positioned within the superior aspect of the right atrium. Final catheter positioning was confirmed and documented with a spot radiographic image. The catheter aspirates and flushes normally. The catheter was flushed with appropriate volume heparin dwells. The catheter exit site was secured with a 0-Prolene retention suture. The venotomy incision was closed with Dermabond. Dressings were applied. The patient tolerated the procedure well without immediate post procedural complication. IMPRESSION: Successful placement of 19 cm tip to cuff tunneled hemodialysis catheter via the right internal jugular vein with tips terminating within the superior aspect of the right atrium. The catheter is ready for immediate use. Electronically Signed   By: Jacqulynn Cadet M.D.   On: 10/25/2019 20:15   DG CHEST PORT 1 VIEW  Result Date: 10/25/2019 CLINICAL DATA:  Placement of hemodialysis catheter with subsequent removal EXAM: PORTABLE CHEST 1 VIEW COMPARISON:  05/22/2017,  10/25/2019 FINDINGS: No central venous catheter identified on the current image. Enlarged cardiomediastinal silhouette with vascular congestion and bilateral interstitial and ground-glass opacity suspect for edema. Probable left pleural effusion. Aortic atherosclerosis. No pneumothorax. IMPRESSION: Mild cardiomegaly with vascular congestion, and diffuse interstitial and ground-glass opacity, likely pulmonary edema with probable left pleural effusion Electronically Signed   By: Donavan Foil M.D.   On: 10/25/2019 19:18   ECHOCARDIOGRAM COMPLETE  Result Date: 10/26/2019    ECHOCARDIOGRAM REPORT   Patient Name:   Benjamin Watkins Date of Exam: 10/26/2019 Medical Rec #:  [id number]         Height:       74.0 in Accession #:    [id number]        Weight:       222.2 lb Date of Birth:  12-Jan-1933         BSA:  2.273 m Patient Age:    86 years          BP:           98/53 mmHg Patient Gender: M                 HR:           99 bpm. Exam Location:  Inpatient Procedure: 2D Echo, Color Doppler, Cardiac Doppler and Intracardiac            Opacification Agent Indications:    I48.91* Unspecified atrial fibrillation  History:        Patient has no prior history of Echocardiogram examinations.                 Risk Factors:Hypertension and Diabetes.  Sonographer:    Emily Senior RDCS Referring Phys: [id number] STEPHEN K CHIU  Sonographer Comments: Technically difficult due to poor echo windows. IMPRESSIONS  1. Left ventricular ejection fraction, by estimation, is 30 to 35%. The left ventricle has moderately decreased function. The left ventricle demonstrates regional wall motion abnormalities (see scoring diagram/findings for description). The left ventricular internal cavity size was moderately dilated. Left ventricular diastolic parameters are indeterminate. There is severe hypokinesis to akinesis of all apical segments, to the mid wall level. Function at the basal segments is only mildly hypokinetic. This may be consistent  with a stress induced (takotsubo) cardiomyopathy, though multivessel CAD cannot be excluded. LV thrombus excluded by contrast.  2. Right ventricular systolic function is mildly reduced. The right ventricular size is mildly enlarged. There is mildly elevated pulmonary artery systolic pressure.  3. Left atrial size was moderately dilated.  4. Right atrial size was moderately dilated.  5. The mitral valve is grossly normal. Mild mitral valve regurgitation. No evidence of mitral stenosis.  6. The aortic valve has an indeterminant number of cusps. Aortic valve regurgitation is trivial. Mild to moderate aortic valve sclerosis/calcification is present, without any evidence of aortic stenosis. Comparison(s): No prior Echocardiogram. FINDINGS  Left Ventricle: Left ventricular ejection fraction, by estimation, is 30 to 35%. The left ventricle has moderately decreased function. The left ventricle demonstrates regional wall motion abnormalities. Definity contrast agent was given IV to delineate the left ventricular endocardial borders. The left ventricular internal cavity size was moderately dilated. There is no left ventricular hypertrophy. Left ventricular diastolic parameters are indeterminate.  LV Wall Scoring: The mid and distal anterior wall, mid and distal lateral wall, mid and distal anterior septum, entire apex, mid and distal inferior wall, mid anterolateral segment, and mid inferoseptal segment are akinetic. The basal anteroseptal segment, basal inferolateral segment, basal anterolateral segment, basal anterior segment, basal inferior segment, and basal inferoseptal segment are hypokinetic. There is severe hypokinesis to akinesis of all apical segments, to the mid wall level. Function at the basal segments is only mildly hypokinetic. This may be consistent with a stress induced (takotsubo) cardiomyopathy, though multivessel CAD cannot be excluded. LV thrombus excluded by contrast. Right Ventricle: The right  ventricular size is mildly enlarged. Right vetricular wall thickness was not assessed. Right ventricular systolic function is mildly reduced. There is mildly elevated pulmonary artery systolic pressure. The tricuspid regurgitant velocity is 2.57 m/s, and with an assumed right atrial pressure of 8 mmHg, the estimated right ventricular systolic pressure is 34.4 mmHg. Left Atrium: Left atrial size was moderately dilated. Right Atrium: Right atrial size was moderately dilated. Pericardium: Trivial pericardial effusion is present. Mitral Valve: The mitral valve is grossly normal. There is mild   thickening of the mitral valve leaflet(s). There is mild calcification of the mitral valve leaflet(s). Mild mitral annular calcification. Mild mitral valve regurgitation. No evidence of mitral valve stenosis. Tricuspid Valve: The tricuspid valve is normal in structure. Tricuspid valve regurgitation is mild . No evidence of tricuspid stenosis. Aortic Valve: The aortic valve has an indeterminant number of cusps. . There is moderate thickening and moderate calcification of the aortic valve. Aortic valve regurgitation is trivial. Mild to moderate aortic valve sclerosis/calcification is present, without any evidence of aortic stenosis. There is moderate thickening of the aortic valve. There is moderate calcification of the aortic valve. Pulmonic Valve: The pulmonic valve was not well visualized. Pulmonic valve regurgitation is not visualized. No evidence of pulmonic stenosis. Aorta: The aortic root and ascending aorta are structurally normal, with no evidence of dilitation. Pulmonary Artery: The pulmonary artery is not well seen. Venous: The inferior vena cava was not well visualized. IAS/Shunts: No atrial level shunt detected by color flow Doppler.  LEFT VENTRICLE PLAX 2D LVIDd:         5.40 cm  Diastology LVIDs:         4.30 cm  LV e' lateral: 7.18 cm/s LV PW:         1.10 cm  LV e' medial:  2.50 cm/s LV IVS:        1.10 cm LVOT diam:      2.10 cm LV SV:         53 LV SV Index:   23 LVOT Area:     3.46 cm  RIGHT VENTRICLE RV S prime:     7.62 cm/s TAPSE (M-mode): 1.6 cm LEFT ATRIUM              Index       RIGHT ATRIUM           Index LA diam:        3.60 cm  1.58 cm/m  RA Area:     28.10 cm LA Vol (A2C):   94.2 ml  41.44 ml/m RA Volume:   95.30 ml  41.93 ml/m LA Vol (A4C):   102.0 ml 44.87 ml/m LA Biplane Vol: 102.0 ml 44.87 ml/m  AORTIC VALVE LVOT Vmax:   77.40 cm/s LVOT Vmean:  56.700 cm/s LVOT VTI:    0.153 m  AORTA Ao Root diam: 3.60 cm TRICUSPID VALVE TR Peak grad:   26.4 mmHg TR Vmax:        257.00 cm/s  SHUNTS Systemic VTI:  0.15 m Systemic Diam: 2.10 cm Buford Dresser MD Electronically signed by Buford Dresser MD Signature Date/Time: 10/26/2019/3:28:10 PM    Final      Medications:   . sodium chloride    . sodium chloride    . amiodarone 30 mg/hr (10/27/19 0515)  . feeding supplement (OSMOLITE 1.5 CAL) 1,000 mL (10/27/19 0010)  . heparin 1,650 Units/hr (10/27/19 0520)  .  sodium bicarbonate (isotonic) infusion in sterile water 100 mL/hr at 10/27/19 0145   . aspirin EC  81 mg Oral Daily  . Chlorhexidine Gluconate Cloth  6 each Topical Q0600  . Chlorhexidine Gluconate Cloth  6 each Topical Q0600  . digoxin  0.25 mg Intravenous Once  . feeding supplement (PRO-STAT SUGAR FREE 64)  30 mL Per Tube BID  . heparin      . insulin aspart  0-15 Units Subcutaneous Q4H  . insulin glargine  20 Units Subcutaneous Daily  . sodium chloride flush  10-40 mL Intracatheter Q12H  .  sodium zirconium cyclosilicate  10 g Oral TID   sodium chloride, sodium chloride, acetaminophen **OR** acetaminophen, alteplase, famotidine, labetalol, lidocaine (PF), lidocaine-prilocaine, mirtazapine, ondansetron **OR** ondansetron (ZOFRAN) IV, pentafluoroprop-tetrafluoroeth, sodium chloride flush  Assessment/ Plan:   Acute on chronic kidney disease.  Etiology unclear ultrasound without obstruction possible acute interstitial  nephritis in view of rash.  Short-term dialysis anticipating some recovery of GFR.  Patient with fairly pronounced azotemia.  Dialysis catheter placed 10/25/2019 patient agitated and confused to remove dialysis catheter.  Placement of dialysis catheter 10/26/2019  Hyperkalemia emergent dialysis 10/26/2019  CLL follow-up with Dr. [name].  Would continue with close follow-up with oncology.  White blood count seems to be critically elevated.  Will discuss with oncology  Hypotension.  Atrial fibrillation with rapid ventricular rate.  This may be problematic and limits his ability to receive dialysis treatments.  Metabolic acidosis resolved with dialysis will discontinue IV sodium bicarbonate  Anemia followed by heme appreciate assistance  Atrial fibrillation started on IV amiodarone.   LOS: [place] _0 _1 :48 AM

## 2019-10-27 NOTE — Progress Notes (Signed)
Physical Therapy Treatment Patient Details Name: Benjamin Watkins MRN: [id number] DOB: [birth year] Today's Date: 10/27/2019    History of Present Illness 86 y.o. male with medical history of CLL, HTN, DM2 who presents from the oncology office for abnormal labs.  Pt admitted for acute kidney injury and dehydration    PT Comments    Pt was able to participate in skilled tx today but needed max encouragement to complete most tasks. Was able to get to edge of bed with mod a assist to sit and min for return, was actually able to stand at edge of bed, from elevated surface, with mod a and RW and was able to take some few shuffled side steps to move up at side of bed. Pt c/o of hurting but unable to give exact location stating hs body hurts, was able to ist edge of bed with BUE support approx 5-40mins. Will continue to encourage pt to complete as mch mobility a she can tolerate and sit in recliner as much as able also. D/c recommendation has been ammended.     Follow Up Recommendations  SNF;Supervision for mobility/OOB     Equipment Recommendations  Rolling walker with 5" wheels    Recommendations for Other Services       Precautions / Restrictions Precautions Precautions: Fall Precaution Comments: hx R foot drop Restrictions Weight Bearing Restrictions: No    Mobility  Bed Mobility Overal bed mobility: Needs Assistance Bed Mobility: Supine to Sit;Sit to Supine     Supine to sit: Mod assist Sit to supine: Min assist   General bed mobility comments: uses bed rails to complete  Transfers Overall transfer level: Needs assistance Equipment used: Rolling walker (2 wheeled) Transfers: Sit to/from Stand Sit to Stand: Mod assist         General transfer comment: able to stand with walker and mod a from elevated bed  Ambulation/Gait Ambulation/Gait assistance: Mod assist           General Gait Details: only able to take small shuffled steps at edge of bed to scoot up in  bed   Stairs             Wheelchair Mobility    Modified Rankin (Stroke Patients Only)       Balance Overall balance assessment: Needs assistance Sitting-balance support: Feet supported;Bilateral upper extremity supported Sitting balance-Leahy Scale: Fair     Standing balance support: During functional activity;Bilateral upper extremity supported Standing balance-Leahy Scale: Poor                              Cognition Arousal/Alertness: Lethargic Behavior During Therapy: WFL for tasks assessed/performed Overall Cognitive Status: No family/caregiver present to determine baseline cognitive functioning                                        Exercises      General Comments General comments (skin integrity, edema, etc.): HR in low 80s at rest and increase to max 102bpm w/ supine to sitting edge of bed, 02 sats in 90s when able to get reading      Pertinent Vitals/Pain Pain Assessment: 0-10 Pain Score: 5  Pain Location: "my body" Pain Descriptors / Indicators: Grimacing;Guarding Pain Intervention(s): Limited activity within patient's tolerance;Monitored during session    Home Living  Prior Function            PT Goals (current goals can now be found in the care plan section) Acute Rehab PT Goals Patient Stated Goal: none stated PT Goal Formulation: With patient/family Time For Goal Achievement: 11/09/19 Potential to Achieve Goals: Fair Progress towards PT goals: Not progressing toward goals - comment    Frequency    Min 2X/week      PT Plan Discharge plan needs to be updated    Co-evaluation              AM-PAC PT "6 Clicks" Mobility   Outcome Measure  Help needed turning from your back to your side while in a flat bed without using bedrails?: A Little Help needed moving from lying on your back to sitting on the side of a flat bed without using bedrails?: A Little Help needed  moving to and from a bed to a chair (including a wheelchair)?: A Lot Help needed standing up from a chair using your arms (e.g., wheelchair or bedside chair)?: A Lot Help needed to walk in hospital room?: A Lot Help needed climbing 3-5 steps with a railing? : Total 6 Click Score: 13    End of Session Equipment Utilized During Treatment: Gait belt Activity Tolerance: Patient limited by fatigue;Patient limited by lethargy Patient left: in bed;with call bell/phone within reach;with bed alarm set   PT Visit Diagnosis: Difficulty in walking, not elsewhere classified (R26.2);Muscle weakness (generalized) (M62.81)     Time: 7371-0626 PT Time Calculation (min) (ACUTE ONLY): 30 min  Charges:  $Therapeutic Activity: 23-37 mins                     Horald Chestnut, PT    Delford Field 10/27/2019, 12:58 PM

## 2019-10-28 DIAGNOSIS — I4819 Other persistent atrial fibrillation: Secondary | ICD-10-CM

## 2019-10-28 LAB — RENAL FUNCTION PANEL
Albumin: 2.5 g/dL — ABNORMAL LOW (ref 3.5–5.0)
Anion gap: 16 — ABNORMAL HIGH (ref 5–15)
BUN: 134 mg/dL — ABNORMAL HIGH (ref 8–23)
CO2: 23 mmol/L (ref 22–32)
Calcium: 7.1 mg/dL — ABNORMAL LOW (ref 8.9–10.3)
Chloride: 103 mmol/L (ref 98–111)
Creatinine, Ser: 7.4 mg/dL — ABNORMAL HIGH (ref 0.61–1.24)
GFR calc Af Amer: 7 mL/min — ABNORMAL LOW (ref >60)
GFR calc non Af Amer: 6 mL/min — ABNORMAL LOW (ref >60)
Glucose, Bld: 258 mg/dL — ABNORMAL HIGH (ref 70–99)
Phosphorus: 6.9 mg/dL — ABNORMAL HIGH (ref 2.5–4.6)
Potassium: 7.1 mmol/L (ref 3.5–5.1)
Sodium: 142 mmol/L (ref 135–145)

## 2019-10-28 LAB — GLUCOSE, CAPILLARY
Glucose-Capillary: 229 mg/dL — ABNORMAL HIGH (ref 70–99)
Glucose-Capillary: 247 mg/dL — ABNORMAL HIGH (ref 70–99)
Glucose-Capillary: 315 mg/dL — ABNORMAL HIGH (ref 70–99)
Glucose-Capillary: 246 mg/dL — ABNORMAL HIGH (ref 70–99)
Glucose-Capillary: 319 mg/dL — ABNORMAL HIGH (ref 70–99)

## 2019-10-28 LAB — CBC
HCT: 23.4 % — ABNORMAL LOW (ref 39.0–52.0)
HCT: 20.8 % — ABNORMAL LOW (ref 39.0–52.0)
Hemoglobin: 7.2 g/dL — ABNORMAL LOW (ref 13.0–17.0)
Hemoglobin: 6.4 g/dL — CL (ref 13.0–17.0)
MCH: 31.9 pg (ref 26.0–34.0)
MCH: 31.2 pg (ref 26.0–34.0)
MCHC: 30.8 g/dL (ref 30.0–36.0)
MCHC: 30.8 g/dL (ref 30.0–36.0)
MCV: 103.5 fL — ABNORMAL HIGH (ref 80.0–100.0)
MCV: 101.5 fL — ABNORMAL HIGH (ref 80.0–100.0)
Platelets: 92 10*3/uL — ABNORMAL LOW (ref 150–400)
Platelets: 79 10*3/uL — ABNORMAL LOW (ref 150–400)
RBC: 2.26 MIL/uL — ABNORMAL LOW (ref 4.22–5.81)
RBC: 2.05 MIL/uL — ABNORMAL LOW (ref 4.22–5.81)
RDW: 19.9 % — ABNORMAL HIGH (ref 11.5–15.5)
RDW: 19.7 % — ABNORMAL HIGH (ref 11.5–15.5)
WBC: 175.3 10*3/uL (ref 4.0–10.5)
WBC: 161.1 10*3/uL (ref 4.0–10.5)
nRBC: 0.1 % (ref 0.0–0.2)
nRBC: 0.1 % (ref 0.0–0.2)

## 2019-10-28 LAB — PREPARE RBC (CROSSMATCH)

## 2019-10-28 LAB — ABO/RH: ABO/RH(D): O POS

## 2019-10-28 LAB — HEPARIN LEVEL (UNFRACTIONATED): Heparin Unfractionated: 0.52 IU/mL (ref 0.30–0.70)

## 2019-10-28 MED ORDER — HEPARIN SODIUM (PORCINE) 1000 UNIT/ML DIALYSIS
1000.0000 [IU] | INTRAMUSCULAR | Status: DC | PRN
  Administered 2019-10-28: 3200 [IU] via INTRAVENOUS_CENTRAL

## 2019-10-28 MED ORDER — PENTAFLUOROPROP-TETRAFLUOROETH EX AERO: 1.0000 "application " | INHALATION_SPRAY | CUTANEOUS | Status: DC | PRN

## 2019-10-28 MED ORDER — ALTEPLASE 2 MG IJ SOLR: 2.0000 mg | Freq: Once | INTRAMUSCULAR | Status: DC | PRN

## 2019-10-28 MED ORDER — LIDOCAINE HCL (PF) 1 % IJ SOLN: 5.0000 mL | INTRAMUSCULAR | Status: DC | PRN

## 2019-10-28 MED ORDER — LIDOCAINE-PRILOCAINE 2.5-2.5 % EX CREA: 1.0000 "application " | TOPICAL_CREAM | CUTANEOUS | Status: DC | PRN

## 2019-10-28 MED ORDER — SODIUM CHLORIDE 0.9 % IV SOLN: 100.0000 mL | INTRAVENOUS | Status: DC | PRN

## 2019-10-28 MED ORDER — SODIUM CHLORIDE 0.9% IV SOLUTION: Freq: Once | INTRAVENOUS | Status: AC

## 2019-10-28 MED ORDER — HEPARIN SODIUM (PORCINE) 1000 UNIT/ML IJ SOLN
INTRAMUSCULAR | Status: AC
  Filled 2019-10-28: qty 4

## 2019-10-28 MED ORDER — INSULIN GLARGINE 100 UNIT/ML ~~LOC~~ SOLN
30.0000 [IU] | Freq: Every day | SUBCUTANEOUS | Status: DC
  Administered 2019-10-28: 30 [IU] via SUBCUTANEOUS
  Filled 2019-10-28 (×2): qty 0.3

## 2019-10-28 MED ORDER — FAMOTIDINE 20 MG PO TABS
20.0000 mg | ORAL_TABLET | Freq: Every day | ORAL | Status: DC | PRN
  Administered 2019-10-28: 21:00:00 20 mg

## 2019-10-28 NOTE — Progress Notes (Addendum)
CRITICAL VALUE ALERT  Critical Value: Hgb 6.4. WBC 161.1, K 6.0  Date & Time Notied:  10/28/19 1758hrs  Provider Notified: Dr. Doristine Bosworth  Orders Received/Actions taken: Awaiting call back.  Order to transfuse PRBC.

## 2019-10-28 NOTE — Progress Notes (Signed)
Patient back from HD at 1522hrs.

## 2019-10-28 NOTE — Progress Notes (Signed)
Patient taken for HD at 1200hrs via bed.

## 2019-10-28 NOTE — Progress Notes (Addendum)
Physical Therapy Treatment Patient Details Name: Benjamin Watkins MRN: [id number] DOB: 08/31/1932 Today's Date: 10/28/2019    History of Present Illness 86 y.o. male with medical history of CLL, HTN, DM2 who presents from the oncology office for abnormal labs.  Pt admitted for acute kidney injury and dehydration    PT Comments    Pt K level very high this am and he is to have dialysis later on in day, he is in bed and currently asymptomatic, was agreeable to supine therapeutic exercises and repositioning in bed. Completed supine heel slides, SLR and ankle pumps this took extremely long time to complete as pt needed continued encouragement and contained cues and re-direction to task. Pt was able to assist with bed mobility and roll left and right with minimal assistance, also scoot up in bed, bed trended and pt used bed rails but did very well functionally. Functionally pt needs max encouragement to complete most tasks but he appears to be quite capable of more than exhibited. He has been educated and highly encouraged to work on exercises in bed during day time, to work on repositioning also to prevent skin breakdown, pt does very well with bridging and also rolling. D/C recommendation remains valid.     Follow Up Recommendations  SNF;Supervision for mobility/OOB     Equipment Recommendations  Rolling walker with 5" wheels    Recommendations for Other Services       Precautions / Restrictions Precautions Precautions: Fall Precaution Comments: monitor HR and BP, K 7.1 this am Restrictions Weight Bearing Restrictions: No    Mobility  Bed Mobility Overal bed mobility: Needs Assistance Bed Mobility: Rolling Rolling: Min assist         General bed mobility comments: with bed positioning able to roll left and right very well using bed rails also able to scoot himself up in bed using bed rails, does take increased time to do so needing increased cues also  Transfers                  General transfer comment: did not attempt transfers today sec to pt K being so high  Ambulation/Gait             General Gait Details: did not attempt   Stairs             Wheelchair Mobility    Modified Rankin (Stroke Patients Only)       Balance                                            Cognition Arousal/Alertness: Lethargic Behavior During Therapy: WFL for tasks assessed/performed Overall Cognitive Status: No family/caregiver present to determine baseline cognitive functioning                                 General Comments: needs cues and instructions reiterated multiple times and needs re-direction to task frequently      Exercises General Exercises - Lower Extremity Ankle Circles/Pumps: AROM;Strengthening;Both;10 reps Heel Slides: AROM;Strengthening;Both;10 reps Straight Leg Raises: AROM;Strengthening;Both;10 reps    General Comments        Pertinent Vitals/Pain Pain Assessment: No/denies pain Pain Location: denies pain but moaning throughout session    Home Living  Prior Function            PT Goals (current goals can now be found in the care plan section) Acute Rehab PT Goals Patient Stated Goal: none stated PT Goal Formulation: With patient/family Time For Goal Achievement: 11/09/19 Potential to Achieve Goals: Fair Progress towards PT goals: Not progressing toward goals - comment    Frequency    Min 2X/week      PT Plan Discharge plan needs to be updated    Co-evaluation              AM-PAC PT "6 Clicks" Mobility   Outcome Measure  Help needed turning from your back to your side while in a flat bed without using bedrails?: A Little Help needed moving from lying on your back to sitting on the side of a flat bed without using bedrails?: A Little Help needed moving to and from a bed to a chair (including a wheelchair)?: A Lot Help needed standing  up from a chair using your arms (e.g., wheelchair or bedside chair)?: A Lot Help needed to walk in hospital room?: A Lot Help needed climbing 3-5 steps with a railing? : Total 6 Click Score: 13    End of Session   Activity Tolerance: Patient limited by fatigue;Patient limited by lethargy;Patient limited by pain;Treatment limited secondary to medical complications (Comment) Patient left: in bed;with call bell/phone within reach;with bed alarm set Nurse Communication: Mobility status PT Visit Diagnosis: Difficulty in walking, not elsewhere classified (R26.2);Muscle weakness (generalized) (M62.81)     Time: 8270-7867 PT Time Calculation (min) (ACUTE ONLY): 16 min  Charges:  $Therapeutic Exercise: 8-22 mins                     Horald Chestnut, PT    Delford Field 10/28/2019, 10:27 AM

## 2019-10-28 NOTE — Progress Notes (Signed)
CRITICAL VALUE ALERT  Critical Value:  K 7.1  Date & Time Notied:  10/28/19 0839hrs  Provider Notified: DR. Doristine Bosworth  Orders Received/Actions taken: Dr. Doristine Bosworth has notified nephrology. Patient for HD today.  No new orders at this time.

## 2019-10-28 NOTE — Progress Notes (Signed)
[facility] ROUNDING NOTE   Subjective:   This is an 86-year gentleman with a history of CLL with acute on chronic kidney disease stage III.  He was admitted from the oncology office for abnormal labs including a creatinine of 4.58.  On 10/03/2019 blood work showed a creatinine of 2.59.  At that time he also had a rash having recently had venetoclax for treatment of his CLL.  He was started on oral steroids.  In addition to this the patient was also on chlorthalidone and PPI all of which could have contributed to the rash.  Is acute on chronic kidney failure was thought to be secondary to either acute interstitial nephritis or acute tubular necrosis.  He was transferred from [facility] hospital 10/24/2019 to [facility] for initiation of dialysis.  It was thought that his fluctuating mental status was secondary to uremia.  Patient has become increasingly more confused.  He pulled out his  dialysis cath 10/24/2029.   He had another dialysis catheter placed 10/27/2019 and received dialysis 10/27/2019.  Scheduled for another dialysis treatment 10/28/2019.  Blood pressure 105/55 pulse 93 temperature 97.7 O2 sats 100% room air  Sodium 142 potassium 7.1 chloride 103 CO2 23 BUN 134 creatinine 7.4 glucose 258 calcium 7.1 phosphorus 6.9 albumin 2.5 White blood count 175 hemoglobin 7.2 platelets 92  Aspirin 81 mg daily, insulin sliding scale, amiodarone IV 30 mg/h    Objective:  Vital signs in last 24 hours:  Temp:  [97.5 F (36.4 C)-98.5 F (36.9 C)] 97.7 F (36.5 C) (04/02 0859) Pulse Rate:  [62-104] 85 (04/02 0859) Resp:  [12-19] 12 (04/02 0859) BP: (92-119)/(40-56) 107/41 (04/02 0859) SpO2:  [98 %-100 %] 100 % (04/02 0859) Weight:  [102.2 kg] 102.2 kg (04/02 0357)  Weight change: 1.3 kg Filed Weights   10/26/19 2300 10/27/19 0544 10/28/19 0357  Weight: 100.9 kg 104 kg 102.2 kg    Intake/Output: I/O last 3 completed shifts: In: 2246.7 [I.V.:1406.7; NG/GT:840] Out: 635  [Urine:625; Other:10]   Intake/Output this shift:  Total I/O In: 65 [NG/GT:65] Out: -   Elderly male nondistressed  CVS- RRR no rubs RS-clear bilaterally ABD- BS present soft non-distended Foley catheter EXT- no edema   Basic Metabolic Panel: Recent Labs  Lab 10/24/19 0539 10/24/19 0539 10/26/19 0508 10/26/19 0508 10/26/19 1436 10/26/19 1436 10/26/19 1807 10/27/19 0342 10/28/19 0702  NA 145  142   < > 142  --  147*  --  148* 146* 142  K 4.9  4.6   < > 6.5*  --  6.2*  --  6.6* 4.6 7.1*  CL 112*  111   < > 112*  --  112*  --  113* 106 103  CO2 15*  16*   < > 15*  --  17*  --  18* 21* 23  GLUCOSE 225*  228*   < > 171*  --  242*  --  223* 254* 258*  BUN 112*  111*   < > 141*  --  146*  --  149* 98* 134*  CREATININE 7.56*  7.51*   < > 8.30*  --  8.58*  --  8.61* 6.11* 7.40*  CALCIUM 7.9*  7.6*   < > 7.0*   < > 7.4*   < > 7.1* 7.2* 7.1*  MG  --   --  2.1  --   --   --   --   --   --   PHOS 5.8*  --  6.6*  --  7.2*  --   --  5.6* 6.9*   < > = values in this interval not displayed.    Liver Function Tests: Recent Labs  Lab 10/24/19 0539 10/26/19 0508 10/26/19 1436 10/27/19 0342 10/28/19 0702  ALBUMIN 3.0* 2.5* 2.8* 2.5* 2.5*   No results for input(s): LIPASE, AMYLASE in the last 168 hours. No results for input(s): AMMONIA in the last 168 hours.  CBC: Recent Labs  Lab 10/22/19 0449 10/22/19 0449 10/23/19 0351 10/24/19 0539 10/26/19 0508 10/27/19 0342 10/28/19 0702  WBC 75.4*   < > 83.1* 100.0* 135.4* 149.1* 175.3*  NEUTROABS 6.2  --  6.6 7.1  --   --   --   HGB 8.1*   < > 7.7* 8.9* 8.0* 7.4* 7.2*  HCT 26.2*   < > 24.8* 29.1* 26.0* 24.3* 23.4*  MCV 104.8*   < > 105.5* 105.8* 102.0* 102.5* 103.5*  PLT 91*   < > 92* 118* 114* 95* 92*   < > = values in this interval not displayed.    Cardiac Enzymes: No results for input(s): CKTOTAL, CKMB, CKMBINDEX, TROPONINI in the last 168 hours.  BNP: Invalid input(s): POCBNP  CBG: Recent Labs  Lab  10/27/19 1611 10/27/19 2026 10/27/19 2335 10/28/19 0359 10/28/19 0818  GLUCAP 301* 262* 252* 229* 247*    Microbiology: Results for orders placed or performed during the hospital encounter of 10/18/19  SARS CORONAVIRUS 2 (TAT 6-24 HRS) Nasopharyngeal Nasopharyngeal Swab     Status: None   Collection Time: 10/18/19  2:49 PM   Specimen: Nasopharyngeal Swab  Result Value Ref Range Status   SARS Coronavirus 2 NEGATIVE NEGATIVE Final    Comment: (NOTE) SARS-CoV-2 target nucleic acids are NOT DETECTED. The SARS-CoV-2 RNA is generally detectable in upper and lower respiratory specimens during the acute phase of infection. Negative results do not preclude SARS-CoV-2 infection, do not rule out co-infections with other pathogens, and should not be used as the sole basis for treatment or other patient management decisions. Negative results must be combined with clinical observations, patient history, and epidemiological information. The expected result is Negative. Fact Sheet for Patients: SugarRoll.be Fact Sheet for Healthcare Providers: https://www.woods-mathews.com/ This test is not yet approved or cleared by the [place] FDA and  has been authorized for detection and/or diagnosis of SARS-CoV-2 by FDA under an Emergency Use Authorization (EUA). This EUA will remain  in effect (meaning this test can be used) for the duration of the COVID-19 declaration under Section 56 4(b)(1) of the Act, 21 U.S.C. section 360bbb-3(b)(1), unless the authorization is terminated or revoked sooner. Performed at [facility], [place] [address] [place], [place] [address]     Coagulation Studies: No results for input(s): LABPROT, INR in the last 72 hours.  Urinalysis: No results for input(s): COLORURINE, LABSPEC, PHURINE, GLUCOSEU, HGBUR, BILIRUBINUR, KETONESUR, PROTEINUR, UROBILINOGEN, NITRITE, LEUKOCYTESUR in the last 72 hours.  Invalid input(s):  APPERANCEUR    Imaging: No results found.   Medications:   . sodium chloride    . sodium chloride    . amiodarone 30 mg/hr (10/28/19 0606)  . feeding supplement (OSMOLITE 1.5 CAL) 65 mL/hr at 10/28/19 0859  . heparin 1,650 Units/hr (10/27/19 2026)   . aspirin EC  81 mg Oral Daily  . Chlorhexidine Gluconate Cloth  6 each Topical Q0600  . Chlorhexidine Gluconate Cloth  6 each Topical Q0600  . digoxin  0.25 mg Intravenous Once  . feeding supplement (PRO-STAT SUGAR FREE  64)  30 mL Per Tube BID  . insulin aspart  0-15 Units Subcutaneous Q4H  . insulin glargine  30 Units Subcutaneous Daily  . sodium chloride flush  10-40 mL Intracatheter Q12H   sodium chloride, sodium chloride, acetaminophen **OR** acetaminophen, alteplase, famotidine, labetalol, lidocaine (PF), lidocaine-prilocaine, mirtazapine, ondansetron **OR** ondansetron (ZOFRAN) IV, pentafluoroprop-tetrafluoroeth, sodium chloride flush  Assessment/ Plan:   Acute on chronic kidney disease.  Etiology unclear ultrasound without obstruction possible acute interstitial nephritis in view of rash.  Short-term dialysis anticipating some recovery of GFR.  Patient with fairly pronounced azotemia.  Dialysis catheter placed 10/25/2019 patient agitated and confused to remove dialysis catheter.  Placement of dialysis catheter 10/26/2019  Hyperkalemia this may be spurious hyperkalemia.  In any case we are going to dialyze patient.  I am not sure that special investigations need to be done at this point  CLL follow-up with Dr. [name].  Would continue with close follow-up with oncology.  White blood count seems to be critically elevated agree with close supervision by oncology.  Appreciate assistance from Dr. [name].  Leukostasis appears to be rare below 400,000.  Hypotension.  Atrial fibrillation with rapid ventricular rate.  This may be problematic and limits his ability to receive dialysis treatments.  Metabolic acidosis resolved    Anemia  followed by heme appreciate assistance  Atrial fibrillation started on IV amiodarone.   LOS: [place] @TODAY @10 :53 AM

## 2019-10-28 NOTE — Progress Notes (Addendum)
PROGRESS NOTE    Benjamin Watkins  VOJ:[id number] DOB: 1932/10/27 DOA: 10/18/2019 PCP: Benjamin Neer, MD    Brief Narrative:  86 y.o.malewith PMH of CLL, HTN, DM2. Patient was sent to ED from oncology office for abnormal labs including creatinine of 4.58.  Patient follows up with Benjamin Watkins for CLL. He was seen on 3/8, underwent blood work hemoglobin was 8.5, creatinine was 2.59. One of the CLL medications by the name of venetoclax was held.  He was started on oral prednisone for treatment of rash. Patient was asked to drink plenty water but apparently he was not able to drink enough water because he remained sleepy most of the hours in the day.  Repeat blood work was obtained on 3/23. Hemoglobin was noted to be low at 8.1, creatinine elevated to 4.58. Patient was admitted to hospitalist service for acute kidney injury on CKD stage 3.  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 -Baseline creatinine between 2-2.2 -Presented with a creatinine of 4.75, had been trending up to a peak of 8.30 on 3/31 despite IV hydration. -Nephrology following. Suspected drug-induced kidney injury secondary to [place], -patient was also on chlorthalidone, PPI. -Offending meds have been stopped. -Pt was transferred to [facility] to initiate HD.  -Pt s/p RIJ tunneled cath 3/30 by IR however patient pulled that out.  He had another right IJ tunnel catheter placement on 10/26/2019.  Received his first dialysis session on the evening of 10/26/2019.  Acute metabolic encephalopathy -Much better.  Much more alert and oriented x3 today.  This was likely due to uremia as he has turned around the corner after his dialysis.    CLL (chronic lymphocytic leukemia) -WBC was 82.5  on admission, progressively increasing and now 135,000 today.  It was in 30-60 range in the past -Benjamin Watkins discussed with oncologist Benjamin Watkins.  He states that patient's CLL is not immediately life-threatening.  His treatment can be held till other medical conditions are stabilized.  -Venclexta has been on hold.  White blood cell up to 175k today.  I personally spoke to Benjamin Watkins over the phone who once again was not concerned about rising white blood cells and recommended to treat his other problems and he will see patient on Monday if he is still here.  He recommended to transfuse for anemia if indicated.  Anemia of chronic disease -Hb 8.1 on presentation. 1 unit of PRBC was given on the day of admission. Hemoglobin down to 7.2 today.  Will recheck in the morning.  Transfuse if less than 7.  DM (diabetes mellitus), type 2  -Home meds include glimepiride, ReliOn 70/30 insulin, 70 units twice a day -Insulin requirement is currently low because of poor creatinine clearance. -Blood sugar still elevated.  Will increase Lantus to 30 units and continue SSI.  Essential hypertension: -Home meds include Coreg 25 mg twice daily and chlorthalidone.  Coreg was stopped on 10/25/2019 when he developed atrial fibrillation and was switched to amiodarone.  Blood pressure remains on the low side.  We will continue to hold that as well as chlorthalidone.  New onset atrial fibrillation with rapid ventricular response: Late afternoon of 10/25/2019, patient developed new onset atrial fibrillation with rapid ventricular response.  Due to low blood pressure, he was started on amiodarone drip as well as heparin drip.  Overnight, he flipped into sinus rhythm.  His IV had infiltrated so amiodarone was not resumed.  Patient remained mostly in the normal sinus rhythm up until this afternoon when he went to atrial fibrillation with heart rates between 120 and 140.  Consulted cardiology on 10/26/2019.  Currently he is only on  heparin and amiodarone drip.  Appreciate cardiology help and management deferred to them.  Combined systolic and diastolic congestive heart failure: Echo shows 30 to 35% ejection fraction.  Does not look volume overloaded though.  Per cardiology, likely stress cardiomyopathy however cannot rule out coronary artery disease.  Will defer further management to cardiology.  Hyperkalemia: 7.1.  Notified nephrology this morning.  Patient for HD today.  DVT prophylaxis: Heparin drip Code Status: Full Family Communication: Pt in room, family not at bedside Disposition Plan: SNF once medically stable and cleared by cardiology and nephrology.  Consultants:   Nephrology  IR  Cardiology  Procedures:   Tunneled HD cath placed by IR 10/25/19  Repeat tunneled HD catheter placement by IR on 10/26/2019  Antimicrobials: Anti-infectives (From admission, onward)   Start     Dose/Rate Route Frequency Ordered Stop   10/26/19 0753  ceFAZolin (ANCEF) 2-4 GM/100ML-% IVPB    Note to Pharmacy: Benjamin Watkins   : cabinet override      10/26/19 0753 10/26/19 1959   10/25/19 1500  ceFAZolin (ANCEF) IVPB 2g/100 mL premix  Status:  Discontinued     2 g 200 mL/hr over 30 Minutes Intravenous To Radiology 10/24/19 1619 10/25/19 1916   10/25/19 1228  ceFAZolin (ANCEF) IVPB 1 g/50 mL premix     over 30 Minutes  Continuous PRN 10/25/19 1233 10/25/19 1228   10/25/19 1217  ceFAZolin (ANCEF) 2-4 GM/100ML-% IVPB    Note to Pharmacy: Benjamin Watkins   : cabinet override      10/25/19 1217 10/26/19 0029      Subjective: Seen and examined this morning.  Much more alert and oriented x3.  No complaints.  Objective: Vitals:   10/28/19 0733 10/28/19 0859 10/28/19 1000 10/28/19 1059  BP: (!) 96/50 (!) 107/41 (!) 105/55 (!) 101/47  Pulse: 99 85 91   Resp: (!) 22 12 (!) 22 13  Temp:  97.7 F (36.5 C)    TempSrc:  Axillary    SpO2: 99% 100% 100%   Weight:      Height:        Intake/Output Summary (Last 24 hours)  at 10/28/2019 1352 Last data filed at 10/28/2019 1200 Gross per 24 hour  Intake 2761.13 ml  Output 475 ml  Net 2286.13 ml   Filed Weights   10/26/19 2300 10/27/19 0544 10/28/19 0357  Weight: 100.9 kg 104 kg 102.2 kg    Examination:  General exam: Appears calm and comfortable but weak Respiratory system: Clear to auscultation. Respiratory effort normal. Cardiovascular system: S1 & S2 heard, irregularly irregular rate and rhythm. No JVD, murmurs, rubs, gallops or clicks.  Trace edema Gastrointestinal system: Abdomen is nondistended, soft and nontender. No organomegaly or masses felt. Normal bowel sounds heard. Central nervous system: Alert and oriented. No focal neurological deficits. Extremities: Symmetric 5 x 5 power. Skin: No rashes, lesions or ulcers.  Psychiatry: Judgement and insight appear normal. Mood & affect flat.    Data Reviewed: I have personally reviewed following labs and imaging studies  CBC: Recent Labs  Lab 10/22/19 0449 10/22/19 0449  10/23/19 0351 10/24/19 0539 10/26/19 0508 10/27/19 0342 10/28/19 0702  WBC 75.4*   < > 83.1* 100.0* 135.4* 149.1* 175.3*  NEUTROABS 6.2  --  6.6 7.1  --   --   --   HGB 8.1*   < > 7.7* 8.9* 8.0* 7.4* 7.2*  HCT 26.2*   < > 24.8* 29.1* 26.0* 24.3* 23.4*  MCV 104.8*   < > 105.5* 105.8* 102.0* 102.5* 103.5*  PLT 91*   < > 92* 118* 114* 95* 92*   < > = values in this interval not displayed.   Basic Metabolic Panel: Recent Labs  Lab 10/24/19 0539 10/24/19 0539 10/26/19 0508 10/26/19 1436 10/26/19 1807 10/27/19 0342 10/28/19 0702  NA 145  142   < > 142 147* 148* 146* 142  K 4.9  4.6   < > 6.5* 6.2* 6.6* 4.6 7.1*  CL 112*  111   < > 112* 112* 113* 106 103  CO2 15*  16*   < > 15* 17* 18* 21* 23  GLUCOSE 225*  228*   < > 171* 242* 223* 254* 258*  BUN 112*  111*   < > 141* 146* 149* 98* 134*  CREATININE 7.56*  7.51*   < > 8.30* 8.58* 8.61* 6.11* 7.40*  CALCIUM 7.9*  7.6*   < > 7.0* 7.4* 7.1* 7.2* 7.1*  MG  --   --   2.1  --   --   --   --   PHOS 5.8*  --  6.6* 7.2*  --  5.6* 6.9*   < > = values in this interval not displayed.   GFR: Estimated Creatinine Clearance: 9.5 mL/min (A) (by C-G formula based on SCr of 7.4 mg/dL (H)). Liver Function Tests: Recent Labs  Lab 10/24/19 0539 10/26/19 0508 10/26/19 1436 10/27/19 0342 10/28/19 0702  ALBUMIN 3.0* 2.5* 2.8* 2.5* 2.5*   No results for input(s): LIPASE, AMYLASE in the last 168 hours. No results for input(s): AMMONIA in the last 168 hours. Coagulation Profile: No results for input(s): INR, PROTIME in the last 168 hours. Cardiac Enzymes: No results for input(s): CKTOTAL, CKMB, CKMBINDEX, TROPONINI in the last 168 hours. BNP (last 3 results) No results for input(s): PROBNP in the last 8760 hours. HbA1C: No results for input(s): HGBA1C in the last 72 hours. CBG: Recent Labs  Lab 10/27/19 2026 10/27/19 2335 10/28/19 0359 10/28/19 0818 10/28/19 1144  GLUCAP 262* 252* 229* 247* 315*   Lipid Profile: No results for input(s): CHOL, HDL, LDLCALC, TRIG, CHOLHDL, LDLDIRECT in the last 72 hours. Thyroid Function Tests: No results for input(s): TSH, T4TOTAL, FREET4, T3FREE, THYROIDAB in the last 72 hours. Anemia Panel: No results for input(s): VITAMINB12, FOLATE, FERRITIN, TIBC, IRON, RETICCTPCT in the last 72 hours. Sepsis Labs: No results for input(s): PROCALCITON, LATICACIDVEN in the last 168 hours.  Recent Results (from the past 240 hour(s))  SARS CORONAVIRUS 2 (TAT 6-24 HRS) Nasopharyngeal Nasopharyngeal Swab     Status: None   Collection Time: 10/18/19  2:49 PM   Specimen: Nasopharyngeal Swab  Result Value Ref Range Status   SARS Coronavirus 2 NEGATIVE NEGATIVE Final    Comment: (NOTE) SARS-CoV-2 target nucleic acids are NOT DETECTED. The SARS-CoV-2 RNA is generally detectable in upper and lower respiratory specimens during the acute phase of infection. Negative results do not preclude SARS-CoV-2 infection, do not rule  out co-infections with other pathogens, and should not be used as the sole basis for treatment or other patient management decisions. Negative  results must be combined with clinical observations, patient history, and epidemiological information. The expected result is Negative. Fact Sheet for Patients: SugarRoll.be Fact Sheet for Healthcare Providers: https://www.woods-mathews.com/ This test is not yet approved or cleared by the [place] FDA and  has been authorized for detection and/or diagnosis of SARS-CoV-2 by FDA under an Emergency Use Authorization (EUA). This EUA will remain  in effect (meaning this test can be used) for the duration of the COVID-19 declaration under Section 56 4(b)(1) of the Act, 21 U.S.C. section 360bbb-3(b)(1), unless the authorization is terminated or revoked sooner. Performed at [facility], [place] [address] [place], [place] [address]      Radiology Studies: No results found.  Scheduled Meds: . aspirin EC  81 mg Oral Daily  . Chlorhexidine Gluconate Cloth  6 each Topical Q0600  . Chlorhexidine Gluconate Cloth  6 each Topical Q0600  . digoxin  0.25 mg Intravenous Once  . feeding supplement (PRO-STAT SUGAR FREE 64)  30 mL Per Tube BID  . insulin aspart  0-15 Units Subcutaneous Q4H  . insulin glargine  30 Units Subcutaneous Daily  . sodium chloride flush  10-40 mL Intracatheter Q12H   Continuous Infusions: . sodium chloride    . sodium chloride    . sodium chloride    . sodium chloride    . amiodarone 30 mg/hr (10/28/19 1200)  . feeding supplement (OSMOLITE 1.5 CAL) 65 mL/hr at 10/28/19 0859  . heparin 1,650 Units/hr (10/28/19 1200)     LOS: 10 days   Total time spent 31 minutes  Darliss Cheney, MD [facility] Pager On Amion  If 7PM-7AM, please contact night-coverage 10/28/2019, 1:52 PM

## 2019-10-28 NOTE — Progress Notes (Signed)
Nutrition Follow-up  DOCUMENTATION CODES:   Non-severe (moderate) malnutrition in context of chronic illness  INTERVENTION:   Continue Osmolite 1.5 @ 32ml/hr (1554ml) via Cortrak tube  87ml Pro-stat BID  Provides 2540 kcal, 127 grams protein, 1188 ml free water  NUTRITION DIAGNOSIS:   Moderate Malnutrition related to chronic illness(cancer) as evidenced by percent weight loss, mild muscle depletion, mild fat depletion. Ongoing.   GOAL:   Patient will meet greater than or equal to 90% of their needs Meeting with TF  MONITOR:   TF tolerance, Weight trends, Labs, I & O's  REASON FOR ASSESSMENT:   Malnutrition Screening Tool    ASSESSMENT:   86 year old male with past medical history of chronic lymphocytic leukemia, HTN, DM2, and diverticulosis presented from outpatient oncology office for evaluation of abnormal labs (BUN 69, Cr 4.58, WBC 82.5, Hgb 8.1, plt 79) Patient reports feeling weak, decreased po intake and unable to drink as much water as needed due to sleeping all day, occasionally feeling light headed when he walks and has had elevated sugars in the high 200s over the past couple of weeks.  Per nephrology hyperkalemia may be spurious but currently receiving HD TF at goal rate  3/30 tunneled HD catheter placed; pt pulled  3/31 Cortrak placed 3/31 HD cath placed - received HD late evening  4/2 iHD today (currently in HD)  Medications reviewed and include:  Novolog, 30 units Lantus daily Labs reviewed: Potassium 7.1 (H), Phosphorus 6.9 (H)  UOP: 625 ml x24 hours I/O: +1849 ml since admit UF: 510 ml (3/31)  Diet Order:   Diet Order            Diet NPO time specified Except for: Sips with Meds  Diet effective midnight              EDUCATION NEEDS:   Not appropriate for education at this time  Skin:  Skin Assessment: Skin Integrity Issues: Skin Integrity Issues:: Stage II Stage II: R buttocks  Last BM:  4/2  Height:   Ht Readings from Last 1  Encounters:  10/18/19 6\' 2"  (1.88 m)    Weight:   Wt Readings from Last 1 Encounters:  10/28/19 102.2 kg    BMI:  Body mass index is 28.93 kg/m.  Estimated Nutritional Needs:   Kcal:  2400-2600  Protein:  120-135 grams  Fluid:  1021ml + UOP  Elian Gloster P., RD, LDN, CNSC See AMiON for contact information

## 2019-10-28 NOTE — Progress Notes (Signed)
[place] for Heparin Indication: atrial fibrillation  Allergies  Allergen Reactions  . Hydrochlorothiazide Other (See Comments)    "It just bothers me"  . Lisinopril Other (See Comments)    "Takes away energy"    Patient Measurements: Height: 6\' 2"  (188 cm) Weight: 102.2 kg (225 lb 5 oz) IBW/kg (Calculated) : 82.2 Heparin Dosing Weight: 103 kg  Vital Signs: Temp: 97.7 F (36.5 C) (04/02 0859) Temp Source: Axillary (04/02 0859) BP: 101/47 (04/02 1059) Pulse Rate: 91 (04/02 1000)  Labs: Recent Labs    10/26/19 0508 10/26/19 0508 10/26/19 1436 10/26/19 1807 10/27/19 0342 10/28/19 0702  HGB 8.0*   < >  --   --  7.4* 7.2*  HCT 26.0*  --   --   --  24.3* 23.4*  PLT 114*  --   --   --  95* 92*  HEPARINUNFRC 0.18*  --   --  0.44 0.55 0.52  CREATININE 8.30*  --    < > 8.61* 6.11* 7.40*   < > = values in this interval not displayed.    Estimated Creatinine Clearance: 9.5 mL/min (A) (by C-G formula based on SCr of 7.4 mg/dL (H)).   Medical History: Past Medical History:  Diagnosis Date  . CLL (chronic lymphocytic leukemia) ([place])   . Diabetes mellitus without complication ([place])   . Diverticulosis   . History of colon polyps   . Hypertension   . Low back pain   . Right foot drop     Assessment: 86 yr old male admitted on 10/18/19 with AKI/dehydration, now with a fib with RVR. Pharmacy was consulted to dose heparin. Pt was on no anticoagulants PTA.   Heparin continues to be at goal this morning. hgb down slightly from 8>7.4>7.2, platelets 92. No issues with IV or bleeding observed.  Goal of Therapy:  Heparin level 0.3-0.7 units/ml Monitor platelets by anticoagulation protocol: Yes   Plan:  Continue heparin infusion at 1650 units/hr Monitor daily heparin level, CBC Monitor for signs/symptoms of bleeding  Erin Hearing PharmD., BCPS Clinical Pharmacist 10/28/2019 12:49 PM

## 2019-10-28 NOTE — Progress Notes (Signed)
Progress Note  Patient Name: JESSY CYBULSKI Date of Encounter: 10/28/2019  Primary Cardiologist: Mertie Moores, MD   Subjective   Patient alert but still somewhat confused. Keeps moaning but states he is not in any pain and does not know why he is moaning. Denies any shortness of breath, chest pain, or palpitations.   Inpatient Medications    Scheduled Meds: . aspirin EC  81 mg Oral Daily  . Chlorhexidine Gluconate Cloth  6 each Topical Q0600  . Chlorhexidine Gluconate Cloth  6 each Topical Q0600  . digoxin  0.25 mg Intravenous Once  . feeding supplement (PRO-STAT SUGAR FREE 64)  30 mL Per Tube BID  . insulin aspart  0-15 Units Subcutaneous Q4H  . insulin glargine  30 Units Subcutaneous Daily  . sodium chloride flush  10-40 mL Intracatheter Q12H   Continuous Infusions: . sodium chloride    . sodium chloride    . amiodarone 30 mg/hr (10/28/19 0606)  . feeding supplement (OSMOLITE 1.5 CAL) 65 mL/hr at 10/28/19 0859  . heparin 1,650 Units/hr (10/27/19 2026)   PRN Meds: sodium chloride, sodium chloride, acetaminophen **OR** acetaminophen, alteplase, famotidine, labetalol, lidocaine (PF), lidocaine-prilocaine, mirtazapine, ondansetron **OR** ondansetron (ZOFRAN) IV, pentafluoroprop-tetrafluoroeth, sodium chloride flush   Vital Signs    Vitals:   10/27/19 2011 10/27/19 2311 10/28/19 0357 10/28/19 0859  BP: (!) 111/51 (!) 108/53 (!) 92/56 (!) 107/41  Pulse: 69 66 (!) 104 85  Resp: 19 19 19 12   Temp: 98.1 F (36.7 C) 98.5 F (36.9 C) 97.8 F (36.6 C) 97.7 F (36.5 C)  TempSrc: Oral Axillary Axillary Axillary  SpO2: 99% 98% 100% 100%  Weight:   102.2 kg   Height:        Intake/Output Summary (Last 24 hours) at 10/28/2019 0944 Last data filed at 10/28/2019 0900 Gross per 24 hour  Intake 2111.66 ml  Output 375 ml  Net 1736.66 ml   Last 3 Weights 10/28/2019 10/27/2019 10/26/2019  Weight (lbs) 225 lb 5 oz 229 lb 4.5 oz 222 lb 7.1 oz  Weight (kg) 102.2 kg 104 kg 100.9 kg     Telemetry    Atrial fibrillation with baseline rates in the 80's to 90's with spikes to the 120's to 130's.  - Personally Reviewed  ECG    No new EKG tracing. - Personally Reviewed  Physical Exam   GEN: No acute distress.   Neck: Supple. No JVD Cardiac: Irregularly irregular rhythm. No murmurs, rubs, or gallops appreciated. Respiratory: Clear to auscultation bilaterally. GI: Soft, non-distended, and non-tender. MS: Mild lower extremity edema (left > right) with hyperpigmentation noted on anterior aspect of left leg consistent with chronic venous stasis. No deformity. Neuro: Confused. Psych: Confused.  Labs    High Sensitivity Troponin:  No results for input(s): TROPONINIHS in the last 720 hours.    Chemistry Recent Labs  Lab 10/26/19 1436 10/26/19 1436 10/26/19 1807 10/27/19 0342 10/28/19 0702  NA 147*   < > 148* 146* 142  K 6.2*   < > 6.6* 4.6 7.1*  CL 112*   < > 113* 106 103  CO2 17*   < > 18* 21* 23  GLUCOSE 242*   < > 223* 254* 258*  BUN 146*   < > 149* 98* 134*  CREATININE 8.58*   < > 8.61* 6.11* 7.40*  CALCIUM 7.4*   < > 7.1* 7.2* 7.1*  ALBUMIN 2.8*  --   --  2.5* 2.5*  GFRNONAA 5*   < >  5* 8* 6*  GFRAA 6*   < > 6* 9* 7*  ANIONGAP 18*   < > 17* 19* 16*   < > = values in this interval not displayed.     Hematology Recent Labs  Lab 10/26/19 0508 10/27/19 0342 10/28/19 0702  WBC 135.4* 149.1* 175.3*  RBC 2.55* 2.37* 2.26*  HGB 8.0* 7.4* 7.2*  HCT 26.0* 24.3* 23.4*  MCV 102.0* 102.5* 103.5*  MCH 31.4 31.2 31.9  MCHC 30.8 30.5 30.8  RDW 20.0* 20.0* 19.9*  PLT 114* 95* 92*    BNPNo results for input(s): BNP, PROBNP in the last 168 hours.   DDimer No results for input(s): DDIMER in the last 168 hours.   Radiology    ECHOCARDIOGRAM COMPLETE  Result Date: 10/26/2019    ECHOCARDIOGRAM REPORT   Patient Name:   ZACKRY DEINES Date of Exam: 10/26/2019 Medical Rec #:  [id number]         Height:       74.0 in Accession #:    [id number]        Weight:        222.2 lb Date of Birth:  01/06/1933         BSA:          2.273 m Patient Age:    86 years          BP:           98/53 mmHg Patient Gender: M                 HR:           99 bpm. Exam Location:  Inpatient Procedure: 2D Echo, Color Doppler, Cardiac Doppler and Intracardiac            Opacification Agent Indications:    I48.91* Unspecified atrial fibrillation  History:        Patient has no prior history of Echocardiogram examinations.                 Risk Factors:Hypertension and Diabetes.  Sonographer:    Raquel Sarna Senior RDCS Referring Phys: [place]  Sonographer Comments: Technically difficult due to poor echo windows. IMPRESSIONS  1. Left ventricular ejection fraction, by estimation, is 30 to 35%. The left ventricle has moderately decreased function. The left ventricle demonstrates regional wall motion abnormalities (see scoring diagram/findings for description). The left ventricular internal cavity size was moderately dilated. Left ventricular diastolic parameters are indeterminate. There is severe hypokinesis to akinesis of all apical segments, to the mid wall level. Function at the basal segments is only mildly hypokinetic. This may be consistent with a stress induced (takotsubo) cardiomyopathy, though multivessel CAD cannot be excluded. LV thrombus excluded by contrast.  2. Right ventricular systolic function is mildly reduced. The right ventricular size is mildly enlarged. There is mildly elevated pulmonary artery systolic pressure.  3. Left atrial size was moderately dilated.  4. Right atrial size was moderately dilated.  5. The mitral valve is grossly normal. Mild mitral valve regurgitation. No evidence of mitral stenosis.  6. The aortic valve has an indeterminant number of cusps. Aortic valve regurgitation is trivial. Mild to moderate aortic valve sclerosis/calcification is present, without any evidence of aortic stenosis. Comparison(s): No prior Echocardiogram. FINDINGS  Left Ventricle:  Left ventricular ejection fraction, by estimation, is 30 to 35%. The left ventricle has moderately decreased function. The left ventricle demonstrates regional wall motion abnormalities. Definity contrast agent was given IV to delineate the  left ventricular endocardial borders. The left ventricular internal cavity size was moderately dilated. There is no left ventricular hypertrophy. Left ventricular diastolic parameters are indeterminate.  LV Wall Scoring: The mid and distal anterior wall, mid and distal lateral wall, mid and distal anterior septum, entire apex, mid and distal inferior wall, mid anterolateral segment, and mid inferoseptal segment are akinetic. The basal anteroseptal segment, basal inferolateral segment, basal anterolateral segment, basal anterior segment, basal inferior segment, and basal inferoseptal segment are hypokinetic. There is severe hypokinesis to akinesis of all apical segments, to the mid wall level. Function at the basal segments is only mildly hypokinetic. This may be consistent with a stress induced (takotsubo) cardiomyopathy, though multivessel CAD cannot be excluded. LV thrombus excluded by contrast. Right Ventricle: The right ventricular size is mildly enlarged. Right vetricular wall thickness was not assessed. Right ventricular systolic function is mildly reduced. There is mildly elevated pulmonary artery systolic pressure. The tricuspid regurgitant velocity is 2.57 m/s, and with an assumed right atrial pressure of 8 mmHg, the estimated right ventricular systolic pressure is 81.1 mmHg. Left Atrium: Left atrial size was moderately dilated. Right Atrium: Right atrial size was moderately dilated. Pericardium: Trivial pericardial effusion is present. Mitral Valve: The mitral valve is grossly normal. There is mild thickening of the mitral valve leaflet(s). There is mild calcification of the mitral valve leaflet(s). Mild mitral annular calcification. Mild mitral valve regurgitation. No  evidence of mitral valve stenosis. Tricuspid Valve: The tricuspid valve is normal in structure. Tricuspid valve regurgitation is mild . No evidence of tricuspid stenosis. Aortic Valve: The aortic valve has an indeterminant number of cusps. . There is moderate thickening and moderate calcification of the aortic valve. Aortic valve regurgitation is trivial. Mild to moderate aortic valve sclerosis/calcification is present, without any evidence of aortic stenosis. There is moderate thickening of the aortic valve. There is moderate calcification of the aortic valve. Pulmonic Valve: The pulmonic valve was not well visualized. Pulmonic valve regurgitation is not visualized. No evidence of pulmonic stenosis. Aorta: The aortic root and ascending aorta are structurally normal, with no evidence of dilitation. Pulmonary Artery: The pulmonary artery is not well seen. Venous: The inferior vena cava was not well visualized. IAS/Shunts: No atrial level shunt detected by color flow Doppler.  LEFT VENTRICLE PLAX 2D LVIDd:         5.40 cm  Diastology LVIDs:         4.30 cm  LV e' lateral: 7.18 cm/s LV PW:         1.10 cm  LV e' medial:  2.50 cm/s LV IVS:        1.10 cm LVOT diam:     2.10 cm LV SV:         53 LV SV Index:   23 LVOT Area:     3.46 cm  RIGHT VENTRICLE RV S prime:     7.62 cm/s TAPSE (M-mode): 1.6 cm LEFT ATRIUM              Index       RIGHT ATRIUM           Index LA diam:        3.60 cm  1.58 cm/m  RA Area:     28.10 cm LA Vol (A2C):   94.2 ml  41.44 ml/m RA Volume:   95.30 ml  41.93 ml/m LA Vol (A4C):   102.0 ml 44.87 ml/m LA Biplane Vol: 102.0 ml 44.87 ml/m  AORTIC VALVE LVOT  Vmax:   77.40 cm/s LVOT Vmean:  56.700 cm/s LVOT VTI:    0.153 m  AORTA Ao Root diam: 3.60 cm TRICUSPID VALVE TR Peak grad:   26.4 mmHg TR Vmax:        257.00 cm/s  SHUNTS Systemic VTI:  0.15 m Systemic Diam: 2.10 cm Buford Dresser MD Electronically signed by Buford Dresser MD Signature Date/Time: 10/26/2019/3:28:10 PM     Final     Cardiac Studies   Echocardiogram 10/26/2019: Impressions: 1. Left ventricular ejection fraction, by estimation, is 30 to 35%. The  left ventricle has moderately decreased function. The left ventricle  demonstrates regional wall motion abnormalities (see scoring  diagram/findings for description). The left  ventricular internal cavity size was moderately dilated. Left ventricular  diastolic parameters are indeterminate. There is severe hypokinesis to  akinesis of all apical segments, to the mid wall level. Function at the  basal segments is only mildly  hypokinetic. This may be consistent with a stress induced (takotsubo)  cardiomyopathy, though multivessel CAD cannot be excluded. LV thrombus  excluded by contrast.  2. Right ventricular systolic function is mildly reduced. The right  ventricular size is mildly enlarged. There is mildly elevated pulmonary  artery systolic pressure.  3. Left atrial size was moderately dilated.  4. Right atrial size was moderately dilated.  5. The mitral valve is grossly normal. Mild mitral valve regurgitation.  No evidence of mitral stenosis.  6. The aortic valve has an indeterminant number of cusps. Aortic valve  regurgitation is trivial. Mild to moderate aortic valve  sclerosis/calcification is present, without any evidence of aortic  stenosis.   Comparison(s): No prior Echocardiogram.   Patient Profile   Mr. Lawrance is a 86 y.o. male with a history of hypertension, diabetes mellitus, chronic anemia, and CKD who is being seen today for the evaluation of new onset atrial fibrillation at the request of Dr. Doristine Bosworth.  Assessment & Plan    New Onset Paroxysmal Atrial Fibrillation  - Telemetry reviewed and shows atrial fibrillation with baseline rates in the 80's to 90's with spikes to the 120's to 130's.  - Potassium 7.1 today. Patient for HD today. - Magnesium 2.1 on 10/26/2019. - Will check TSH. - Echo shows LVEF of 30-35%. -  Continue Amiodarone drip.  - BP soft at time but recommended starting low dose Metoprolol when able. - Continue IV Heparin. - Will repeat EKG given hyperkalemia.  Acute on Chronic Combined CHF - Echo this admission shows LVEF of 30-35% with severe hypokinesis of all apical segments consistent with stress induced cardiomyopathy but multivessel CAD cannot be excluded. Also noted mild MR, mild TR, and mildly elevated PASP. - Patient ultimately need to be started on evidence based CHF medications. Would start with beta-blocker when able from a BP standpoint. - Does not appear significantly volume overloaded on exam today but looks like plan is for another hemodialysis session today due to hyperkalemia.   CLL - WBC 175 today. - Management per Oncology.  Chronic Anemia - Hemoglobin 7.2 today. - Management per primary team and Nephrology.  Type 2 Diabetes Mellitus - Management per primary team.  Acute on Chronic Kidney Disease - Presented with creatinine of 4.75 and rose to 8.30 on 3/31. Baseline 2 to 2.2. - Started on dialysis this admission. - Looks like plan is for another session today due to hyperkalemia. - Management per Nephrology.  Acute Metabolic Encephalopathy - Management per primary team.    For questions or updates, please contact [facility] Please consult [email or web address] for contact info under        Signed, Darreld Mclean, PA-C  10/28/2019, 9:44 AM     Attending Note:   The patient was seen and examined.  Agree with assessment and plan as noted above.  Changes made to the above note as needed.  Patient seen and independently examined with Sande Rives, PA .   We discussed all aspects of the encounter. I agree with the assessment and plan as stated above.  1.  New onset atrial fibrillation: Patient has new onset atrial fibrillation.  His heart rate is better on amiodarone.    He is currently on heparin drip. Continue current medications.  2.  End-stage  renal disease: At present his potassium is 7.1 and his creatinine is elevated.  He needs to go for dialysis fairly urgently.  He.  Acute on chronic combined systolic and diastolic congestive heart failure: His ejection fraction is between 30 and 35%.  He has mildly elevated pulmonary artery pressures.  He will need further evaluation of his CHF but will need to wait till after his kidneys have stabilized.   I have spent a total of 40 minutes with patient reviewing hospital  notes , telemetry, EKGs, labs and examining patient as well as establishing an assessment and plan that was discussed with the patient. > 50% of time was spent in direct patient care.    Thayer Headings, Brooke Bonito., MD, [facility] 10/28/2019, 11:38 AM [address],  [place] Pager [phone]

## 2019-10-28 NOTE — Plan of Care (Signed)

## 2019-10-29 LAB — RENAL FUNCTION PANEL
Albumin: 2.4 g/dL — ABNORMAL LOW (ref 3.5–5.0)
Albumin: 2.6 g/dL — ABNORMAL LOW (ref 3.5–5.0)
Anion gap: 13 (ref 5–15)
Anion gap: 15 (ref 5–15)
BUN: 81 mg/dL — ABNORMAL HIGH (ref 8–23)
BUN: 104 mg/dL — ABNORMAL HIGH (ref 8–23)
CO2: 24 mmol/L (ref 22–32)
CO2: 24 mmol/L (ref 22–32)
Calcium: 7 mg/dL — ABNORMAL LOW (ref 8.9–10.3)
Calcium: 7.2 mg/dL — ABNORMAL LOW (ref 8.9–10.3)
Chloride: 105 mmol/L (ref 98–111)
Chloride: 102 mmol/L (ref 98–111)
Creatinine, Ser: 4.75 mg/dL — ABNORMAL HIGH (ref 0.61–1.24)
Creatinine, Ser: 5.66 mg/dL — ABNORMAL HIGH (ref 0.61–1.24)
GFR calc Af Amer: 12 mL/min — ABNORMAL LOW (ref >60)
GFR calc Af Amer: 10 mL/min — ABNORMAL LOW (ref >60)
GFR calc non Af Amer: 10 mL/min — ABNORMAL LOW (ref >60)
GFR calc non Af Amer: 8 mL/min — ABNORMAL LOW (ref >60)
Glucose, Bld: 273 mg/dL — ABNORMAL HIGH (ref 70–99)
Glucose, Bld: 308 mg/dL — ABNORMAL HIGH (ref 70–99)
Phosphorus: 4.8 mg/dL — ABNORMAL HIGH (ref 2.5–4.6)
Phosphorus: 6.2 mg/dL — ABNORMAL HIGH (ref 2.5–4.6)
Potassium: 6 mmol/L — ABNORMAL HIGH (ref 3.5–5.1)
Potassium: 5.1 mmol/L (ref 3.5–5.1)
Sodium: 142 mmol/L (ref 135–145)
Sodium: 141 mmol/L (ref 135–145)

## 2019-10-29 LAB — GLUCOSE, CAPILLARY
Glucose-Capillary: 180 mg/dL — ABNORMAL HIGH (ref 70–99)
Glucose-Capillary: 186 mg/dL — ABNORMAL HIGH (ref 70–99)
Glucose-Capillary: 192 mg/dL — ABNORMAL HIGH (ref 70–99)
Glucose-Capillary: 252 mg/dL — ABNORMAL HIGH (ref 70–99)
Glucose-Capillary: 322 mg/dL — ABNORMAL HIGH (ref 70–99)
Glucose-Capillary: 336 mg/dL — ABNORMAL HIGH (ref 70–99)

## 2019-10-29 LAB — CBC
HCT: 23.2 % — ABNORMAL LOW (ref 39.0–52.0)
Hemoglobin: 7.1 g/dL — ABNORMAL LOW (ref 13.0–17.0)
MCH: 31.6 pg (ref 26.0–34.0)
MCHC: 30.6 g/dL (ref 30.0–36.0)
MCV: 103.1 fL — ABNORMAL HIGH (ref 80.0–100.0)
Platelets: 74 10*3/uL — ABNORMAL LOW (ref 150–400)
RBC: 2.25 MIL/uL — ABNORMAL LOW (ref 4.22–5.81)
RDW: 19 % — ABNORMAL HIGH (ref 11.5–15.5)
WBC: 169.3 10*3/uL (ref 4.0–10.5)
nRBC: 0.1 % (ref 0.0–0.2)

## 2019-10-29 LAB — HEMOGLOBIN AND HEMATOCRIT, BLOOD
HCT: 21.1 % — ABNORMAL LOW (ref 39.0–52.0)
Hemoglobin: 6.5 g/dL — CL (ref 13.0–17.0)

## 2019-10-29 LAB — PREPARE RBC (CROSSMATCH)

## 2019-10-29 LAB — HEPARIN LEVEL (UNFRACTIONATED): Heparin Unfractionated: 0.67 IU/mL (ref 0.30–0.70)

## 2019-10-29 MED ORDER — LIDOCAINE HCL (PF) 1 % IJ SOLN: 5.0000 mL | INTRAMUSCULAR | Status: DC | PRN

## 2019-10-29 MED ORDER — ALTEPLASE 2 MG IJ SOLR: 2.0000 mg | Freq: Once | INTRAMUSCULAR | Status: DC | PRN

## 2019-10-29 MED ORDER — CHLORHEXIDINE GLUCONATE CLOTH 2 % EX PADS
6.0000 | MEDICATED_PAD | Freq: Every day | CUTANEOUS | Status: DC
  Administered 2019-10-29: 12:00:00 6 via TOPICAL

## 2019-10-29 MED ORDER — HEPARIN SODIUM (PORCINE) 1000 UNIT/ML DIALYSIS
1000.0000 [IU] | INTRAMUSCULAR | Status: DC | PRN
  Filled 2019-10-29 (×2): qty 1

## 2019-10-29 MED ORDER — PENTAFLUOROPROP-TETRAFLUOROETH EX AERO: 1.0000 "application " | INHALATION_SPRAY | CUTANEOUS | Status: DC | PRN

## 2019-10-29 MED ORDER — SODIUM CHLORIDE 0.9 % IV SOLN: 100.0000 mL | INTRAVENOUS | Status: DC | PRN

## 2019-10-29 MED ORDER — SODIUM CHLORIDE 0.9% IV SOLUTION: Freq: Once | INTRAVENOUS | Status: DC

## 2019-10-29 MED ORDER — LIDOCAINE-PRILOCAINE 2.5-2.5 % EX CREA
1.0000 "application " | TOPICAL_CREAM | CUTANEOUS | Status: DC | PRN
  Filled 2019-10-29: qty 5

## 2019-10-29 MED ORDER — INSULIN GLARGINE 100 UNIT/ML ~~LOC~~ SOLN
40.0000 [IU] | Freq: Every day | SUBCUTANEOUS | Status: DC
  Administered 2019-10-29: 40 [IU] via SUBCUTANEOUS
  Filled 2019-10-29 (×2): qty 0.4

## 2019-10-29 NOTE — Progress Notes (Signed)
PROGRESS NOTE    Benjamin Watkins  RDE:[id number] DOB: 1932-12-07 DOA: 10/18/2019 PCP: [name], MD    Brief Narrative:  86 y.o.malewith PMH of CLL, HTN, DM2. Patient was sent to ED from oncology office for abnormal labs including creatinine of 4.58.  Patient follows up with Dr. [name] for CLL. He was seen on 3/8, underwent blood work hemoglobin was 8.5, creatinine was 2.59. One of the CLL medications by the name of venetoclax was held.  He was started on oral prednisone for treatment of rash. Patient was asked to drink plenty water but apparently he was not able to drink enough water because he remained sleepy most of the hours in the day.  Repeat blood work was obtained on 3/23. Hemoglobin was noted to be low at 8.1, creatinine elevated to 4.58. Patient was admitted to hospitalist service for acute kidney injury on CKD stage 3.  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 -Baseline creatinine between 2-2.2 -Presented with a creatinine of 4.75, had been trending up to a peak of 8.30 on 3/31 despite IV hydration. -Nephrology following. Suspected drug-induced kidney injury secondary to [place], -patient was also on chlorthalidone, PPI. -Offending meds have been stopped. -Pt was transferred to [facility] to initiate HD.  -Pt s/p RIJ tunneled cath 3/30 by IR however patient pulled that out.  He had another right IJ tunnel catheter placement on 10/26/2019.  Received his first dialysis session on the evening of 10/26/2019.  Acute metabolic encephalopathy -Much better.  Much more alert and oriented x3 today.  This was likely due to uremia as he has turned around the corner after his dialysis.    CLL (chronic lymphocytic leukemia) -WBC was 82.5  on admission, progressively increasing and now 160 9K today.  It was in 30-60 range in the past -Dr. Pietro Cassis discussed with oncologist Dr. [name].  He states that patient's CLL is not immediately life-threatening.  His treatment can be held till other medical conditions are stabilized.  -Venclexta has been on hold.  White blood cell up to 175k today.  I personally spoke to Dr. [name] over the phone who once again on 10/27/2019 who was not concerned about rising white blood cells and recommended to treat his other problems and he will see patient on Monday if he is still here.  He recommended to transfuse for anemia if indicated.  Anemia of chronic disease -Hb 8.1 on presentation. 1 unit of PRBC was given on the day of admission. Hemoglobin down to 6.4 on 4-21.  Received another unit of transfusion.  Globin 7.1 today.  Monitor daily.  Keep hemoglobin over 7.  Transfuse as needed.    DM (diabetes mellitus), type 2  -Home meds include glimepiride, ReliOn 70/30 insulin, 70 units twice a day -Insulin requirement is currently low because of poor creatinine clearance. -Blood sugar still elevated.  Will increase Lantus to 40 units and continue SSI.  Essential hypertension: -Home meds include Coreg 25 mg twice daily and chlorthalidone.  Coreg was stopped on 10/25/2019 when he developed atrial fibrillation and was switched to amiodarone.  Blood pressure remains on the low side.  We will continue to hold that as well as chlorthalidone.  New onset atrial fibrillation with  rapid ventricular response: Late afternoon of 10/25/2019, patient developed new onset atrial fibrillation with rapid ventricular response.  Due to low blood pressure, he was started on amiodarone drip as well as heparin drip.  Overnight, he flipped into sinus rhythm.  His IV had infiltrated so amiodarone was not resumed.  Patient remained mostly in the normal sinus rhythm up until this afternoon when he went to atrial fibrillation with heart rates  between 120 and 140.  Consulted cardiology on 10/26/2019.  Currently he is only on heparin and amiodarone drip.  Appreciate cardiology help and management deferred to them.  Combined systolic and diastolic congestive heart failure: Echo shows 30 to 35% ejection fraction.  Does not look volume overloaded though.  Per cardiology, likely stress cardiomyopathy however cannot rule out coronary artery disease.  Will defer further management to cardiology.  Hyperkalemia: Resolved.  DVT prophylaxis: Heparin drip Code Status: Full Family Communication: Pt in room, family not at bedside Disposition Plan: SNF once medically stable and cleared by cardiology and nephrology.  Consultants:   Nephrology  IR  Cardiology  Procedures:   Tunneled HD cath placed by IR 10/25/19  Repeat tunneled HD catheter placement by IR on 10/26/2019  Antimicrobials: Anti-infectives (From admission, onward)   Start     Dose/Rate Route Frequency Ordered Stop   10/26/19 0753  ceFAZolin (ANCEF) 2-4 GM/100ML-% IVPB    Note to Pharmacy: [name]   : cabinet override      10/26/19 0753 10/26/19 1959   10/25/19 1500  ceFAZolin (ANCEF) IVPB 2g/100 mL premix  Status:  Discontinued     2 g 200 mL/hr over 30 Minutes Intravenous To Radiology 10/24/19 1619 10/25/19 1916   10/25/19 1228  ceFAZolin (ANCEF) IVPB 1 g/50 mL premix     over 30 Minutes  Continuous PRN 10/25/19 1233 10/25/19 1228   10/25/19 1217  ceFAZolin (ANCEF) 2-4 GM/100ML-% IVPB    Note to Pharmacy: [name]   : cabinet override      10/25/19 1217 10/26/19 0029      Subjective: Seen and examined.  Patient completely alert and oriented x4.  Has no complaints.  Objective: Vitals:   10/29/19 0350 10/29/19 0411 10/29/19 0803 10/29/19 0821  BP:  (!) 109/53 101/63 (!) 111/41  Pulse: (!) 112 75 89 87  Resp: (!) 21 (!) 28 (!) 25 16  Temp:  97.6 F (36.4 C)  97.8 F (36.6 C)  TempSrc:  Axillary  Axillary  SpO2: 100% 99% 97% 100%  Weight:  105.7  kg    Height:        Intake/Output Summary (Last 24 hours) at 10/29/2019 0945 Last data filed at 10/29/2019 0350 Gross per 24 hour  Intake 1508.71 ml  Output -839 ml  Net 2347.71 ml   Filed Weights   10/28/19 1200 10/28/19 1455 10/29/19 0411  Weight: 105.3 kg 106.5 kg 105.7 kg    Examination:  General exam: Appears calm and comfortable  Respiratory system: Clear to auscultation. Respiratory effort normal. Cardiovascular system: S1 & S2 heard, irregularly regular rate and rhythm. No JVD, murmurs, rubs, gallops or clicks.  Trace pitting edema bilateral lower extremity Gastrointestinal system: Abdomen is nondistended, soft and nontender. No organomegaly or masses felt. Normal bowel sounds heard. Central nervous system: Alert and oriented. No focal neurological deficits. Extremities: Symmetric 5 x 5 power. Skin: No rashes, lesions or ulcers.  Psychiatry: Judgement and insight appear normal. Mood & affect appropriate.   Data Reviewed: I have personally reviewed following labs  and imaging studies  CBC: Recent Labs  Lab 10/23/19 0351 10/23/19 0351 10/24/19 0539 10/24/19 0539 10/26/19 0508 10/27/19 0342 10/28/19 0702 10/28/19 1641 10/29/19 0905  WBC 83.1*   < > 100.0*   < > 135.4* 149.1* 175.3* 161.1* 169.3*  NEUTROABS 6.6  --  7.1  --   --   --   --   --   --   HGB 7.7*   < > 8.9*   < > 8.0* 7.4* 7.2* 6.4* 7.1*  HCT 24.8*   < > 29.1*   < > 26.0* 24.3* 23.4* 20.8* 23.2*  MCV 105.5*   < > 105.8*   < > 102.0* 102.5* 103.5* 101.5* 103.1*  PLT 92*   < > 118*   < > 114* 95* 92* 79* 74*   < > = values in this interval not displayed.   Basic Metabolic Panel: Recent Labs  Lab 10/26/19 0508 10/26/19 0508 10/26/19 1436 10/26/19 1436 10/26/19 1807 10/27/19 0342 10/28/19 0702 10/28/19 1641 10/29/19 0724  NA 142   < > 147*   < > 148* 146* 142 142 141  K 6.5*   < > 6.2*   < > 6.6* 4.6 7.1* 6.0* 5.1  CL 112*   < > 112*   < > 113* 106 103 105 102  CO2 15*   < > 17*   < > 18* 21*  23 24 24   GLUCOSE 171*   < > 242*   < > 223* 254* 258* 273* 308*  BUN 141*   < > 146*   < > 149* 98* 134* 81* 104*  CREATININE 8.30*   < > 8.58*   < > 8.61* 6.11* 7.40* 4.75* 5.66*  CALCIUM 7.0*   < > 7.4*   < > 7.1* 7.2* 7.1* 7.0* 7.2*  MG 2.1  --   --   --   --   --   --   --   --   PHOS 6.6*   < > 7.2*  --   --  5.6* 6.9* 4.8* 6.2*   < > = values in this interval not displayed.   GFR: Estimated Creatinine Clearance: 12.6 mL/min (A) (by C-G formula based on SCr of 5.66 mg/dL (H)). Liver Function Tests: Recent Labs  Lab 10/26/19 1436 10/27/19 0342 10/28/19 0702 10/28/19 1641 10/29/19 0724  ALBUMIN 2.8* 2.5* 2.5* 2.4* 2.6*   No results for input(s): LIPASE, AMYLASE in the last 168 hours. No results for input(s): AMMONIA in the last 168 hours. Coagulation Profile: No results for input(s): INR, PROTIME in the last 168 hours. Cardiac Enzymes: No results for input(s): CKTOTAL, CKMB, CKMBINDEX, TROPONINI in the last 168 hours. BNP (last 3 results) No results for input(s): PROBNP in the last 8760 hours. HbA1C: No results for input(s): HGBA1C in the last 72 hours. CBG: Recent Labs  Lab 10/28/19 1629 10/28/19 2101 10/29/19 0008 10/29/19 0438 10/29/19 0804  GLUCAP 246* 319* 336* 322* 252*   Lipid Profile: No results for input(s): CHOL, HDL, LDLCALC, TRIG, CHOLHDL, LDLDIRECT in the last 72 hours. Thyroid Function Tests: No results for input(s): TSH, T4TOTAL, FREET4, T3FREE, THYROIDAB in the last 72 hours. Anemia Panel: No results for input(s): VITAMINB12, FOLATE, FERRITIN, TIBC, IRON, RETICCTPCT in the last 72 hours. Sepsis Labs: No results for input(s): PROCALCITON, LATICACIDVEN in the last 168 hours.  No results found for this or any previous visit (from the past 240 hour(s)).   Radiology Studies: No results found.  Scheduled Meds: .  aspirin EC  81 mg Oral Daily  . Chlorhexidine Gluconate Cloth  6 each Topical Q0600  . Chlorhexidine Gluconate Cloth  6 each Topical  Q0600  . digoxin  0.25 mg Intravenous Once  . feeding supplement (PRO-STAT SUGAR FREE 64)  30 mL Per Tube BID  . insulin aspart  0-15 Units Subcutaneous Q4H  . insulin glargine  40 Units Subcutaneous Daily  . sodium chloride flush  10-40 mL Intracatheter Q12H   Continuous Infusions: . sodium chloride    . sodium chloride    . sodium chloride    . sodium chloride    . amiodarone 30 mg/hr (10/29/19 0554)  . feeding supplement (OSMOLITE 1.5 CAL) Stopped (10/29/19 0001)  . heparin 1,650 Units/hr (10/29/19 0329)     LOS: 11 days   Total time spent 29 minutes  Darliss Cheney, MD [facility] Pager On Amion  If 7PM-7AM, please contact night-coverage 10/29/2019, 9:45 AM

## 2019-10-29 NOTE — Progress Notes (Signed)
ANTICOAGULATION CONSULT NOTE Pharmacy Consult for Heparin Indication: atrial fibrillation  Patient Measurements: Height: 6\' 2"  (188 cm) Weight: 105.7 kg (233 lb) IBW/kg (Calculated) : 82.2 Heparin Dosing Weight: 103 kg  Vital Signs: Temp: 97.8 F (36.6 C) (04/03 0821) Temp Source: Axillary (04/03 0821) BP: 111/41 (04/03 0821) Pulse Rate: 87 (04/03 0821)  Labs: Recent Labs    10/27/19 0342 10/27/19 0342 10/28/19 0702 10/28/19 0702 10/28/19 1641 10/29/19 0724 10/29/19 0905  HGB 7.4*   < > 7.2*   < > 6.4*  --  7.1*  HCT 24.3*   < > 23.4*  --  20.8*  --  23.2*  PLT 95*   < > 92*  --  79*  --  74*  HEPARINUNFRC 0.55  --  0.52  --   --  0.67  --   CREATININE 6.11*   < > 7.40*  --  4.75* 5.66*  --    < > = values in this interval not displayed.    Medical History: Past Medical History:  Diagnosis Date  . CLL (chronic lymphocytic leukemia) ([place])   . Diabetes mellitus without complication ([place])   . Diverticulosis   . History of colon polyps   . Hypertension   . Low back pain   . Right foot drop     Assessment: 86 yr old male admitted on 10/18/19 with AKI/dehydration, now with a fib with RVR. Patient's PMH includes CLL and CKD.  Pharmacy was consulted to dose heparin. Pt was on no anticoagulants PTA.   Heparin continues to be therapeutic, levels are within goal range this morning. hgb fell to < 7 yesterday - he received 1 unit pRBC, hgb rose to 7.1 today.  Goal of Therapy:  Heparin level 0.3-0.7 units/ml Monitor platelets by anticoagulation protocol: Yes   Plan:  Continue heparin infusion at 1650 units/hr Monitor daily heparin level, CBC Monitor for signs/symptoms of bleeding  Harvel Quale 10/29/2019 10:02 AM

## 2019-10-29 NOTE — Evaluation (Signed)
Clinical/Bedside Swallow Evaluation Patient Details  Name: Benjamin Watkins MRN: [id number] Date of Birth: 1932/09/11  Today's Date: 10/29/2019 Time: SLP Start Time (ACUTE ONLY): 67 SLP Stop Time (ACUTE ONLY): 1040 SLP Time Calculation (min) (ACUTE ONLY): 20 min  Past Medical History:  Past Medical History:  Diagnosis Date  . CLL (chronic lymphocytic leukemia) ([place])   . Diabetes mellitus without complication ([place])   . Diverticulosis   . History of colon polyps   . Hypertension   . Low back pain   . Right foot drop    Past Surgical History:  Past Surgical History:  Procedure Laterality Date  . CHOLECYSTECTOMY     laparoscopic  . COLONOSCOPY WITH PROPOFOL N/A 12/10/2015   Procedure: COLONOSCOPY WITH PROPOFOL;  Surgeon: Garlan Fair, MD;  Location: [facility];  Service: Endoscopy;  Laterality: N/A;  . EYE SURGERY Bilateral    bleeding retina  . IR FLUORO GUIDE CV LINE RIGHT  10/25/2019  . IR FLUORO GUIDE CV LINE RIGHT  10/26/2019  . IR US GUIDE VASC ACCESS RIGHT  10/25/2019  . IR US GUIDE VASC ACCESS RIGHT  10/26/2019  . TONSILLECTOMY     HPI:  Patient is an 86 y.o. male with PMH: CLL, HTN, DM-2 who presents from oncology office for abnormal labs.  Patient reported to admitting MD that he was told to drink more water but he cannot as he sleeps all day. He also reported he has not been eating much, feels weak and is occasionally light-headed when he walks. Patient was admitted for acute kidney injury and dehydration.   Assessment / Plan / Recommendation Clinical Impression  Patient presents with a mild oropharygneal dysphagia characterized by decreased mastication and oral transit of regular solids, swallow initiation delays with thin liquids, but without overt s/s of aspiration or penetration. Patient did not have any oral residuals post Watkins. Prior to PO's, SLP performed oral care and removed thick secretions from gumline as well as soft palate. SLP Visit Diagnosis:  Dysphagia, unspecified (R13.10)    Aspiration Risk  Mild aspiration risk    Diet Recommendation Dysphagia 1 (Puree);Thin liquid   Liquid Administration via: Straw;Cup Medication Administration: Crushed with puree Supervision: Full supervision/cueing for compensatory strategies;Staff to assist with self feeding Compensations: Minimize environmental distractions;Slow rate;Small sips/bites    Other  Recommendations Oral Care Recommendations: Oral care QID   Follow up Recommendations Other (comment)(TBD)      Frequency and Duration Min 1x/week 1 week       Prognosis Prognosis for Safe Diet Advancement: Good      Swallow Study   General Date of Onset: 10/27/19 HPI: Patient is an 86 y.o. male with PMH: CLL, HTN, DM-2 who presents from oncology office for abnormal labs.  Patient reported to admitting MD that he was told to drink more water but he cannot as he sleeps all day. He also reported he has not been eating much, feels weak and is occasionally light-headed when he walks. Patient was admitted for acute kidney injury and dehydration. Type of Study: Bedside Swallow Evaluation Previous Swallow Assessment: N/A Diet Prior to this Study: NPO Temperature Spikes Noted: No Respiratory Status: Room air History of Recent Intubation: No Behavior/Cognition: Alert;Cooperative;Pleasant mood;Lethargic/Drowsy Oral Cavity Assessment: Dried secretions Oral Care Completed by SLP: Yes Oral Cavity - Dentition: Adequate natural dentition Vision: Functional for self-feeding Self-Feeding Abilities: Needs assist;Needs set up Patient Positioning: Upright in bed Baseline Vocal Quality: Low vocal intensity Volitional Cough: Weak Volitional Swallow: Able to elicit  Oral/Motor/Sensory Function Overall Oral Motor/Sensory Function: Within functional limits   Ice Chips     Thin Liquid Thin Liquid: Impaired Presentation: Straw Pharyngeal  Phase Impairments: Suspected delayed Swallow Other Comments:  No overt s/s of aspiration or penetration observed    Nectar Thick     Honey Thick     Puree Puree: Within functional limits Presentation: Spoon   Solid     Solid: Impaired Oral Phase Functional Implications: Impaired mastication;Prolonged oral transit      Sonia Baller, MA, CCC-SLP Speech Therapy [facility] Pager: [phone]

## 2019-10-29 NOTE — Progress Notes (Signed)
Progress Note  Patient Name: Benjamin Watkins Date of Encounter: 10/29/2019  Primary Cardiologist: Mertie Moores, MD   Subjective   Remains somewhat confused but feeling better.  Is fortunately converted to sinus rhythm with PACs.  Received dialysis yesterday with improvement in potassium.  Inpatient Medications    Scheduled Meds: . aspirin EC  81 mg Oral Daily  . Chlorhexidine Gluconate Cloth  6 each Topical Q0600  . Chlorhexidine Gluconate Cloth  6 each Topical Q0600  . digoxin  0.25 mg Intravenous Once  . feeding supplement (PRO-STAT SUGAR FREE 64)  30 mL Per Tube BID  . insulin aspart  0-15 Units Subcutaneous Q4H  . insulin glargine  40 Units Subcutaneous Daily  . sodium chloride flush  10-40 mL Intracatheter Q12H   Continuous Infusions: . sodium chloride    . sodium chloride    . sodium chloride    . sodium chloride    . amiodarone 30 mg/hr (10/29/19 0554)  . feeding supplement (OSMOLITE 1.5 CAL) Stopped (10/29/19 0001)  . heparin 1,650 Units/hr (10/29/19 0329)   PRN Meds: sodium chloride, sodium chloride, sodium chloride, sodium chloride, acetaminophen **OR** acetaminophen, alteplase, famotidine, heparin, labetalol, lidocaine (PF), lidocaine-prilocaine, mirtazapine, ondansetron **OR** ondansetron (ZOFRAN) IV, pentafluoroprop-tetrafluoroeth, sodium chloride flush   Vital Signs    Vitals:   10/29/19 0350 10/29/19 0411 10/29/19 0803 10/29/19 0821  BP:  (!) 109/53 101/63 (!) 111/41  Pulse: (!) 112 75 89 87  Resp: (!) 21 (!) 28 (!) 25 16  Temp:  97.6 F (36.4 C)  97.8 F (36.6 C)  TempSrc:  Axillary  Axillary  SpO2: 100% 99% 97% 100%  Weight:  105.7 kg    Height:        Intake/Output Summary (Last 24 hours) at 10/29/2019 0945 Last data filed at 10/29/2019 0350 Gross per 24 hour  Intake 1508.71 ml  Output -839 ml  Net 2347.71 ml   Last 3 Weights 10/29/2019 10/28/2019 10/28/2019  Weight (lbs) 233 lb 234 lb 12.6 oz 232 lb 2.3 oz  Weight (kg) 105.688 kg 106.5 kg  105.3 kg      Telemetry    Sinus rhythm with PACs, left bundle branch block- Personally Reviewed  ECG    Sinus rhythm, left bundle branch block- Personally Reviewed  Physical Exam   GEN: No acute distress.   Neck: Supple. No JVD Cardiac: Irregularly irregular rhythm. No murmurs, rubs, or gallops appreciated. Respiratory: Clear to auscultation bilaterally. GI: Soft, non-distended, and non-tender. MS: Mild lower extremity edema (left > right) with hyperpigmentation noted on anterior aspect of left leg consistent with chronic venous stasis. No deformity. Neuro: Confused. Psych: Confused.  GEN: Well nourished, well developed, in no acute distress  HEENT: normal  Neck: no JVD, carotid bruits, or masses Cardiac: Irregular; no murmurs, rubs, or gallops,no edema  Respiratory:  clear to auscultation bilaterally, normal work of breathing GI: soft, nontender, nondistended, + BS MS: no deformity or atrophy  Skin: warm and dry Neuro:  Strength and sensation are intact Psych: Confused   Labs    High Sensitivity Troponin:  No results for input(s): TROPONINIHS in the last 720 hours.    Chemistry Recent Labs  Lab 10/28/19 0702 10/28/19 1641 10/29/19 0724  NA 142 142 141  K 7.1* 6.0* 5.1  CL 103 105 102  CO2 23 24 24   GLUCOSE 258* 273* 308*  BUN 134* 81* 104*  CREATININE 7.40* 4.75* 5.66*  CALCIUM 7.1* 7.0* 7.2*  ALBUMIN 2.5* 2.4* 2.6*  GFRNONAA  6* 10* 8*  GFRAA 7* 12* 10*  ANIONGAP 16* 13 15     Hematology Recent Labs  Lab 10/28/19 0702 10/28/19 1641 10/29/19 0905  WBC 175.3* 161.1* 169.3*  RBC 2.26* 2.05* 2.25*  HGB 7.2* 6.4* 7.1*  HCT 23.4* 20.8* 23.2*  MCV 103.5* 101.5* 103.1*  MCH 31.9 31.2 31.6  MCHC 30.8 30.8 30.6  RDW 19.9* 19.7* 19.0*  PLT 92* 79* 74*    BNPNo results for input(s): BNP, PROBNP in the last 168 hours.   DDimer No results for input(s): DDIMER in the last 168 hours.   Radiology    No results found.  Cardiac Studies    Echocardiogram 10/26/2019: Impressions: 1. Left ventricular ejection fraction, by estimation, is 30 to 35%. The  left ventricle has moderately decreased function. The left ventricle  demonstrates regional wall motion abnormalities (see scoring  diagram/findings for description). The left  ventricular internal cavity size was moderately dilated. Left ventricular  diastolic parameters are indeterminate. There is severe hypokinesis to  akinesis of all apical segments, to the mid wall level. Function at the  basal segments is only mildly  hypokinetic. This may be consistent with a stress induced (takotsubo)  cardiomyopathy, though multivessel CAD cannot be excluded. LV thrombus  excluded by contrast.  2. Right ventricular systolic function is mildly reduced. The right  ventricular size is mildly enlarged. There is mildly elevated pulmonary  artery systolic pressure.  3. Left atrial size was moderately dilated.  4. Right atrial size was moderately dilated.  5. The mitral valve is grossly normal. Mild mitral valve regurgitation.  No evidence of mitral stenosis.  6. The aortic valve has an indeterminant number of cusps. Aortic valve  regurgitation is trivial. Mild to moderate aortic valve  sclerosis/calcification is present, without any evidence of aortic  stenosis.   Comparison(s): No prior Echocardiogram.   Patient Profile   [name] is a 86 y.o. male with a history of hypertension, diabetes mellitus, chronic anemia, and CKD who is being seen today for the evaluation of new onset atrial fibrillation at the request of Dr. Doristine Bosworth.  Assessment & Plan    New Onset Paroxysmal Atrial Fibrillation  Currently on amiodarone and heparin.  Fortunately he has converted to sinus rhythm.  He did receive dialysis yesterday and his potassium is improved down to 5.1.  We Cynda Soule continue his amiodarone drip as he is having quite a few PACs and his chance of going back into atrial fibrillation is  high.  His blood pressures remain in the 1 teens.  If they remain at that level, may be able to start low-dose beta-blocker.  May switch to p.o. amiodarone tomorrow.   Acute on Chronic Combined CHF Ejection fraction 30 to 35% in a pattern that appears to be stress-induced but coronary disease cannot be ruled out.  Reid Nawrot likely need to be on evidence-based therapy for heart failure once blood pressure improves.  No obvious volume overload as recently had dialysis.  CLL Elevated white count to 169 today.  Management per oncology.  Chronic Anemia Chronic likely due to end-stage renal disease.  Type 2 Diabetes Mellitus Management per primary team.  Acute on Chronic Kidney Disease Started on dialysis this admission.  Plan per nephrology.  Acute Metabolic Encephalopathy Management per primary team.   For questions or updates, please contact [place] Please consult [email or web address] for contact info under        Signed, Carleta Woodrow Meredith Leeds, MD  10/29/2019, 9:45 AM

## 2019-10-29 NOTE — Progress Notes (Signed)
Pt has black loose stool, on IV hep, HGB 7.1 post transfusion. Pahwani, MD made aware.

## 2019-10-29 NOTE — Progress Notes (Addendum)
CRITICAL VALUE ALERT  Critical Value: HGB 6.5  Date & Time Notied: 10/29/19 1605  Provider Notified: Doristine Bosworth, MD   Orders Received/Actions taken: Stop IV heparin, Transfuse 1unit of RBC

## 2019-10-29 NOTE — Progress Notes (Signed)
[facility] ROUNDING NOTE   Subjective:   This is an 86-year gentleman with a history of CLL with acute on chronic kidney disease stage III.  He was admitted from the oncology office for abnormal labs including a creatinine of 4.58.  On 10/03/2019 blood work showed a creatinine of 2.59.  At that time he also had a rash having recently had venetoclax for treatment of his CLL.  He was started on oral steroids.  In addition to this the patient was also on chlorthalidone and PPI all of which could have contributed to the rash.  Is acute on chronic kidney failure was thought to be secondary to either acute interstitial nephritis or acute tubular necrosis.  He was transferred from [facility] long hospital 10/24/2019 to [facility] for initiation of dialysis.  It was thought that his fluctuating mental status was secondary to uremia.  Patient has become increasingly more confused.  He pulled out his  dialysis cath 10/24/2029.   He had another dialysis catheter placed 10/27/2019    Underwent dialysis 10/28/2019 treatment #2.  Blood pressure 111/41 pulse 88 temperature 97.8 O2 sats 98% room air  Sodium 141 potassium 5.1 chloride 102 CO2 24 BUN 104 creatinine 5.66 glucose 308 WBC 169  Aspirin 81 mg daily, insulin sliding scale, amiodarone IV 30 mg/h    Objective:  Vital signs in last 24 hours:  Temp:  [97.3 F (36.3 C)-97.9 F (36.6 C)] 97.8 F (36.6 C) (04/03 0821) Pulse Rate:  [64-120] 87 (04/03 0821) Resp:  [13-30] 16 (04/03 0821) BP: (65-127)/(27-64) 111/41 (04/03 0821) SpO2:  [96 %-100 %] 100 % (04/03 0821) Weight:  [105.3 kg-106.5 kg] 105.7 kg (04/03 0411)  Weight change: 3.1 kg Filed Weights   10/28/19 1200 10/28/19 1455 10/29/19 0411  Weight: 105.3 kg 106.5 kg 105.7 kg    Intake/Output: I/O last 3 completed shifts: In: 3450.5 [I.V.:1999.9; Blood:319.6; NG/GT:1131.1] Out: -589 [Urine:275]   Intake/Output this shift:  No intake/output data recorded.  Elderly male  nondistressed  CVS- RRR no rubs RS-clear bilaterally ABD- BS present soft non-distended Foley catheter EXT- no edema   Basic Metabolic Panel: Recent Labs  Lab 10/26/19 0508 10/26/19 0508 10/26/19 1436 10/26/19 1436 10/26/19 1807 10/26/19 1807 10/27/19 0342 10/27/19 0342 10/28/19 0702 10/28/19 1641 10/29/19 0724  NA 142   < > 147*   < > 148*  --  146*  --  142 142 141  K 6.5*   < > 6.2*   < > 6.6*  --  4.6  --  7.1* 6.0* 5.1  CL 112*   < > 112*   < > 113*  --  106  --  103 105 102  CO2 15*   < > 17*   < > 18*  --  21*  --  23 24 24   GLUCOSE 171*   < > 242*   < > 223*  --  254*  --  258* 273* 308*  BUN 141*   < > 146*   < > 149*  --  98*  --  134* 81* 104*  CREATININE 8.30*   < > 8.58*   < > 8.61*  --  6.11*  --  7.40* 4.75* 5.66*  CALCIUM 7.0*   < > 7.4*   < > 7.1*   < > 7.2*   < > 7.1* 7.0* 7.2*  MG 2.1  --   --   --   --   --   --   --   --   --   --  PHOS 6.6*   < > 7.2*  --   --   --  5.6*  --  6.9* 4.8* 6.2*   < > = values in this interval not displayed.    Liver Function Tests: Recent Labs  Lab 10/26/19 1436 10/27/19 0342 10/28/19 0702 10/28/19 1641 10/29/19 0724  ALBUMIN 2.8* 2.5* 2.5* 2.4* 2.6*   No results for input(s): LIPASE, AMYLASE in the last 168 hours. No results for input(s): AMMONIA in the last 168 hours.  CBC: Recent Labs  Lab 10/23/19 0351 10/23/19 0351 10/24/19 0539 10/24/19 0539 10/26/19 0508 10/27/19 0342 10/28/19 0702 10/28/19 1641 10/29/19 0905  WBC 83.1*   < > 100.0*   < > 135.4* 149.1* 175.3* 161.1* 169.3*  NEUTROABS 6.6  --  7.1  --   --   --   --   --   --   HGB 7.7*   < > 8.9*   < > 8.0* 7.4* 7.2* 6.4* 7.1*  HCT 24.8*   < > 29.1*   < > 26.0* 24.3* 23.4* 20.8* 23.2*  MCV 105.5*   < > 105.8*   < > 102.0* 102.5* 103.5* 101.5* 103.1*  PLT 92*   < > 118*   < > 114* 95* 92* 79* 74*   < > = values in this interval not displayed.    Cardiac Enzymes: No results for input(s): CKTOTAL, CKMB, CKMBINDEX, TROPONINI in the last 168  hours.  BNP: Invalid input(s): POCBNP  CBG: Recent Labs  Lab 10/28/19 1629 10/28/19 2101 10/29/19 0008 10/29/19 0438 10/29/19 0804  GLUCAP 246* 319* 336* 322* 252*    Microbiology: Results for orders placed or performed during the hospital encounter of 10/18/19  SARS CORONAVIRUS 2 (TAT 6-24 HRS) Nasopharyngeal Nasopharyngeal Swab     Status: None   Collection Time: 10/18/19  2:49 PM   Specimen: Nasopharyngeal Swab  Result Value Ref Range Status   SARS Coronavirus 2 NEGATIVE NEGATIVE Final    Comment: (NOTE) SARS-CoV-2 target nucleic acids are NOT DETECTED. The SARS-CoV-2 RNA is generally detectable in upper and lower respiratory specimens during the acute phase of infection. Negative results do not preclude SARS-CoV-2 infection, do not rule out co-infections with other pathogens, and should not be used as the sole basis for treatment or other patient management decisions. Negative results must be combined with clinical observations, patient history, and epidemiological information. The expected result is Negative. Fact Sheet for Patients: SugarRoll.be Fact Sheet for Healthcare Providers: https://www.woods-mathews.com/ This test is not yet approved or cleared by the [place] FDA and  has been authorized for detection and/or diagnosis of SARS-CoV-2 by FDA under an Emergency Use Authorization (EUA). This EUA will remain  in effect (meaning this test can be used) for the duration of the COVID-19 declaration under Section 56 4(b)(1) of the Act, 21 U.S.C. section 360bbb-3(b)(1), unless the authorization is terminated or revoked sooner. Performed at [facility], [place] [address], [place], [place] [address]     Coagulation Studies: No results for input(s): LABPROT, INR in the last 72 hours.  Urinalysis: No results for input(s): COLORURINE, LABSPEC, PHURINE, GLUCOSEU, HGBUR, BILIRUBINUR, KETONESUR, PROTEINUR, UROBILINOGEN,  NITRITE, LEUKOCYTESUR in the last 72 hours.  Invalid input(s): APPERANCEUR    Imaging: No results found.   Medications:   . sodium chloride    . sodium chloride    . sodium chloride    . sodium chloride    . amiodarone 30 mg/hr (10/29/19 0554)  . feeding supplement (OSMOLITE  1.5 CAL) Stopped (10/29/19 0001)  . heparin 1,650 Units/hr (10/29/19 0329)   . aspirin EC  81 mg Oral Daily  . Chlorhexidine Gluconate Cloth  6 each Topical Q0600  . Chlorhexidine Gluconate Cloth  6 each Topical Q0600  . digoxin  0.25 mg Intravenous Once  . feeding supplement (PRO-STAT SUGAR FREE 64)  30 mL Per Tube BID  . insulin aspart  0-15 Units Subcutaneous Q4H  . insulin glargine  40 Units Subcutaneous Daily  . sodium chloride flush  10-40 mL Intracatheter Q12H   sodium chloride, sodium chloride, sodium chloride, sodium chloride, acetaminophen **OR** acetaminophen, alteplase, famotidine, heparin, labetalol, lidocaine (PF), lidocaine-prilocaine, mirtazapine, ondansetron **OR** ondansetron (ZOFRAN) IV, pentafluoroprop-tetrafluoroeth, sodium chloride flush  Assessment/ Plan:   Acute on chronic kidney disease.  Etiology unclear ultrasound without obstruction possible acute interstitial nephritis in view of rash.  Short-term dialysis anticipating some recovery of GFR.  Patient with fairly pronounced azotemia.  Dialysis catheter placed 10/25/2019 patient agitated and confused to remove dialysis catheter.  Placement of dialysis catheter 10/26/2019.  Hyperkalemia this may be spurious hyperkalemia.  In any case we are going to dialyze patient.  I will dialyze patient 10/29/2019  CLL follow-up with Dr. [name].  Would continue with close follow-up with oncology.  White blood count seems to be critically elevated agree with close supervision by oncology.  Appreciate assistance from Dr. [name].  Leukostasis appears to be rare below 400,000.  Hypotension.  Atrial fibrillation with rapid ventricular rate.  This may be  problematic and limits his ability to receive dialysis treatments.  Metabolic acidosis resolved    Anemia followed by heme appreciate assistance  Atrial fibrillation started on IV amiodarone.   LOS: [place] @TODAY @9 :39 AM

## 2019-10-29 NOTE — Progress Notes (Signed)
Patient removed his Cortrak, states that he does not remember pulling it out. Cortrak clamp was snipped and Bodenheimer, MD paged.  Elaina Hoops, RN

## 2019-10-30 LAB — CBC
HCT: 23.3 % — ABNORMAL LOW (ref 39.0–52.0)
Hemoglobin: 7.3 g/dL — ABNORMAL LOW (ref 13.0–17.0)
MCH: 30.8 pg (ref 26.0–34.0)
MCHC: 31.3 g/dL (ref 30.0–36.0)
MCV: 98.3 fL (ref 80.0–100.0)
Platelets: 65 10*3/uL — ABNORMAL LOW (ref 150–400)
RBC: 2.37 MIL/uL — ABNORMAL LOW (ref 4.22–5.81)
RDW: 20 % — ABNORMAL HIGH (ref 11.5–15.5)
WBC: 174 10*3/uL (ref 4.0–10.5)
nRBC: 0 % (ref 0.0–0.2)

## 2019-10-30 LAB — RENAL FUNCTION PANEL
Albumin: 2.3 g/dL — ABNORMAL LOW (ref 3.5–5.0)
Anion gap: 10 (ref 5–15)
BUN: 72 mg/dL — ABNORMAL HIGH (ref 8–23)
CO2: 26 mmol/L (ref 22–32)
Calcium: 7.2 mg/dL — ABNORMAL LOW (ref 8.9–10.3)
Chloride: 105 mmol/L (ref 98–111)
Creatinine, Ser: 4.33 mg/dL — ABNORMAL HIGH (ref 0.61–1.24)
GFR calc Af Amer: 14 mL/min — ABNORMAL LOW (ref >60)
GFR calc non Af Amer: 12 mL/min — ABNORMAL LOW (ref >60)
Glucose, Bld: 147 mg/dL — ABNORMAL HIGH (ref 70–99)
Phosphorus: 5 mg/dL — ABNORMAL HIGH (ref 2.5–4.6)
Potassium: 4.4 mmol/L (ref 3.5–5.1)
Sodium: 141 mmol/L (ref 135–145)

## 2019-10-30 LAB — GLUCOSE, CAPILLARY
Glucose-Capillary: 122 mg/dL — ABNORMAL HIGH (ref 70–99)
Glucose-Capillary: 126 mg/dL — ABNORMAL HIGH (ref 70–99)
Glucose-Capillary: 130 mg/dL — ABNORMAL HIGH (ref 70–99)
Glucose-Capillary: 140 mg/dL — ABNORMAL HIGH (ref 70–99)
Glucose-Capillary: 142 mg/dL — ABNORMAL HIGH (ref 70–99)
Glucose-Capillary: 156 mg/dL — ABNORMAL HIGH (ref 70–99)
Glucose-Capillary: 178 mg/dL — ABNORMAL HIGH (ref 70–99)

## 2019-10-30 LAB — HEMOGLOBIN AND HEMATOCRIT, BLOOD
HCT: 24.7 % — ABNORMAL LOW (ref 39.0–52.0)
Hemoglobin: 7.7 g/dL — ABNORMAL LOW (ref 13.0–17.0)

## 2019-10-30 LAB — HEPARIN LEVEL (UNFRACTIONATED): Heparin Unfractionated: <0.1 IU/mL — ABNORMAL LOW (ref 0.30–0.70)

## 2019-10-30 MED ORDER — CHLORHEXIDINE GLUCONATE CLOTH 2 % EX PADS
6.0000 | MEDICATED_PAD | Freq: Every day | CUTANEOUS | Status: DC
  Administered 2019-10-31: 6 via TOPICAL

## 2019-10-30 MED ORDER — HEPARIN SODIUM (PORCINE) 5000 UNIT/ML IJ SOLN
5000.0000 [IU] | Freq: Three times a day (TID) | INTRAMUSCULAR | Status: DC
  Administered 2019-10-30 – 2019-10-31 (×2): 5000 [IU] via SUBCUTANEOUS
  Filled 2019-10-30 (×2): qty 1

## 2019-10-30 MED ORDER — MIDODRINE HCL 5 MG PO TABS
5.0000 mg | ORAL_TABLET | Freq: Once | ORAL | Status: AC
  Administered 2019-10-30: 5 mg via ORAL
  Filled 2019-10-30: qty 1

## 2019-10-30 NOTE — Progress Notes (Signed)
Progress Note  Patient Name: Benjamin Watkins Date of Encounter: 10/30/2019  Primary Cardiologist: Benjamin Moores, MD   Subjective   Patient remains confused but overall has no major complaints.  Remains in sinus rhythm with multiple PACs.  Inpatient Medications    Scheduled Meds: . sodium chloride   Intravenous Once  . aspirin EC  81 mg Oral Daily  . Chlorhexidine Gluconate Cloth  6 each Topical Q0600  . feeding supplement (PRO-STAT SUGAR FREE 64)  30 mL Per Tube BID  . insulin aspart  0-15 Units Subcutaneous Q4H  . insulin glargine  40 Units Subcutaneous Daily  . sodium chloride flush  10-40 mL Intracatheter Q12H   Continuous Infusions: . sodium chloride    . sodium chloride    . sodium chloride    . sodium chloride    . sodium chloride    . sodium chloride    . amiodarone 30 mg/hr (10/30/19 0539)  . feeding supplement (OSMOLITE 1.5 CAL) Stopped (10/29/19 0001)   PRN Meds: sodium chloride, sodium chloride, sodium chloride, sodium chloride, sodium chloride, sodium chloride, acetaminophen **OR** acetaminophen, alteplase, famotidine, heparin, labetalol, lidocaine (PF), lidocaine-prilocaine, mirtazapine, ondansetron **OR** ondansetron (ZOFRAN) IV, pentafluoroprop-tetrafluoroeth, sodium chloride flush   Vital Signs    Vitals:   10/30/19 0330 10/30/19 0407 10/30/19 0523 10/30/19 0606  BP: (!) 95/46 (!) 95/45 (!) 98/49   Pulse: 92 98 92 87  Resp: 20 20 20 19   Temp:  97.6 F (36.4 C) 98.4 F (36.9 C)   TempSrc:  Axillary Oral   SpO2:  97% 97% 97%  Weight:   107 kg   Height:        Intake/Output Summary (Last 24 hours) at 10/30/2019 0857 Last data filed at 10/30/2019 8453 Gross per 24 hour  Intake 90 ml  Output 50 ml  Net 40 ml   Last 3 Weights 10/30/2019 10/30/2019 10/29/2019  Weight (lbs) 235 lb 14.3 oz 235 lb 3.7 oz 233 lb  Weight (kg) 107 kg 106.7 kg 105.688 kg      Telemetry    Sinus rhythm with PACs, left bundle branch block-personally reviewed  ECG    None  new  Physical Exam   GEN: Well nourished, well developed, in no acute distress  HEENT: normal  Neck: no JVD, carotid bruits, or masses Cardiac: Irregular, tachycardic; no murmurs, rubs, or gallops Respiratory:  clear to auscultation bilaterally, normal work of breathing GI: soft, nontender, nondistended, + BS MS: no deformity or atrophy  Skin: warm and dry left greater than right lower extremity edema with signs of chronic venous stasis Neuro:  Strength and sensation are intact Psych: Confused  Labs    High Sensitivity Troponin:  No results for input(s): TROPONINIHS in the last 720 hours.    Chemistry Recent Labs  Lab 10/28/19 1641 10/29/19 0724 10/30/19 0601  NA 142 141 141  K 6.0* 5.1 4.4  CL 105 102 105  CO2 24 24 26   GLUCOSE 273* 308* 147*  BUN 81* 104* 72*  CREATININE 4.75* 5.66* 4.33*  CALCIUM 7.0* 7.2* 7.2*  ALBUMIN 2.4* 2.6* 2.3*  GFRNONAA 10* 8* 12*  GFRAA 12* 10* 14*  ANIONGAP 13 15 10      Hematology Recent Labs  Lab 10/28/19 1641 10/28/19 1641 10/29/19 0905 10/29/19 0905 10/29/19 1528 10/29/19 2342 10/30/19 0601  WBC 161.1*  --  169.3*  --   --   --  174.0*  RBC 2.05*  --  2.25*  --   --   --  2.37*  HGB 6.4*   < > 7.1*   < > 6.5* 7.7* 7.3*  HCT 20.8*   < > 23.2*   < > 21.1* 24.7* 23.3*  MCV 101.5*  --  103.1*  --   --   --  98.3  MCH 31.2  --  31.6  --   --   --  30.8  MCHC 30.8  --  30.6  --   --   --  31.3  RDW 19.7*  --  19.0*  --   --   --  20.0*  PLT 79*  --  74*  --   --   --  65*   < > = values in this interval not displayed.    BNPNo results for input(s): BNP, PROBNP in the last 168 hours.   DDimer No results for input(s): DDIMER in the last 168 hours.   Radiology    No results found.  Cardiac Studies   Echocardiogram 10/26/2019: Impressions: 1. Left ventricular ejection fraction, by estimation, is 30 to 35%. The  left ventricle has moderately decreased function. The left ventricle  demonstrates regional wall motion  abnormalities (see scoring  diagram/findings for description). The left  ventricular internal cavity size was moderately dilated. Left ventricular  diastolic parameters are indeterminate. There is severe hypokinesis to  akinesis of all apical segments, to the mid wall level. Function at the  basal segments is only mildly  hypokinetic. This may be consistent with a stress induced (takotsubo)  cardiomyopathy, though multivessel CAD cannot be excluded. LV thrombus  excluded by contrast.  2. Right ventricular systolic function is mildly reduced. The right  ventricular size is mildly enlarged. There is mildly elevated pulmonary  artery systolic pressure.  3. Left atrial size was moderately dilated.  4. Right atrial size was moderately dilated.  5. The mitral valve is grossly normal. Mild mitral valve regurgitation.  No evidence of mitral stenosis.  6. The aortic valve has an indeterminant number of cusps. Aortic valve  regurgitation is trivial. Mild to moderate aortic valve  sclerosis/calcification is present, without any evidence of aortic  stenosis.   Comparison(s): No prior Echocardiogram.   Patient Profile   Mr. Benjamin Watkins is a 86 y.o. male with a history of hypertension, diabetes mellitus, chronic anemia, and CKD who is being seen today for the evaluation of new onset atrial fibrillation at the request of Dr. Doristine Watkins.  Assessment & Plan    New Onset Paroxysmal Atrial Fibrillation  Plan on amiodarone.  Heparin was stopped yesterday as he became anemic requiring blood transfusion.  He is planned to receive dialysis today.  Fortunately he remains in sinus rhythm with a left bundle branch block and multiple PACs.  May be able to convert to p.o. amiodarone tomorrow.   Acute on Chronic Combined CHF Ejection fraction 30 to 35% in a pattern that could be stress-induced cardiomyopathy though coronary artery disease cannot be ruled out.  He Benjamin Watkins need optimal medical therapy pending kidney  recovery.  CLL Being managed by oncology.  White count has remained stable.  Unfortunately he has become anemic.  If no signs of bleeding, may want to restart anticoagulation if he goes back into atrial fibrillation.   Chronic Anemia Chronic.  Likely due to CLL and potentially kidney disease.  Type 2 Diabetes Mellitus Management per primary team  Acute on Chronic Kidney Disease Started on dialysis this admission.  Acute Metabolic Encephalopathy Per primary team   For questions or updates, please  contact [place] Please consult [email or web address] for contact info under        Signed, Nikol Lemar Meredith Leeds, MD  10/30/2019, 8:57 AM

## 2019-10-30 NOTE — Progress Notes (Addendum)
PROGRESS NOTE    Benjamin Watkins  NID:[id number] DOB: 27-May-1933 DOA: 10/18/2019 PCP: [name], MD    Brief Narrative:  86 y.o.malewith PMH of CLL, HTN, DM2. Patient was sent to ED from oncology office for abnormal labs including creatinine of 4.58.  Patient follows up with Dr. [name] for CLL. He was seen on 3/8, underwent blood work hemoglobin was 8.5, creatinine was 2.59. One of the CLL medications by the name of venetoclax was held.  He was started on oral prednisone for treatment of rash. Patient was asked to drink plenty water but apparently he was not able to drink enough water because he remained sleepy most of the hours in the day.  Repeat blood work was obtained on 3/23. Hemoglobin was noted to be low at 8.1, creatinine elevated to 4.58. Patient was admitted to hospitalist service for acute kidney injury on CKD stage 3.  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 -Baseline creatinine between 2-2.2 -Presented with a creatinine of 4.75, then gradually trended up to a peak of 8.30 on 3/31 despite IV hydration, [place] without obstruction -Nephrology following. Suspected drug-induced kidney injury secondary to [place], -patient was also on chlorthalidone, PPI. -Offending meds have been stopped. -Pt was transferred to [facility] to initiate HD.  -Pt s/p RIJ tunneled cath 3/30 by IR however patient pulled that out.  He had another right IJ tunnel catheter placement on 10/26/2019.  Received his first dialysis session on the evening of 10/26/2019 followed by 4/1 and 4/3. -Urine output is poor -I am not sure patient would be a good long-term dialysis candidate, I picked up patient's care today and discussed with his son [name] who will discuss this further with siblings, palliative care consulted as well for goals of care, to discuss CODE STATUS and decision regarding long-term dialysis, should his kidneys not recover  Acute metabolic encephalopathy -Felt to be secondary to uremia,  improving but still with some confusion, angry and extremely irritable today, mittens on for restraints, pulled out HD catheter a few days ago -Monitor -Delirium precaution  CLL (chronic lymphocytic leukemia) -WBC was 82.5 on admission, progressively increasing and now 172K today.  It was in 30-60 range in the past -Oncology aware of admission , and recommended to previous providers to continue medical treatment for AKI and other issues as appropriate  -Venclexta has been on hold.  White blood cell up to 175k today. Dr.Pahwani spoke to Dr. [name] over the phone who once again on 10/27/2019 who was not concerned about rising white blood cells and recommended to treat his other problems and he will see patient on Monday  Anemia of chronic disease -No active bleeding, received 3 units of PRBC this admission, felt to be secondary to chronic disease, progressive CLL -Monitor CBC -check anemia panel  DM (diabetes mellitus), type 2  -Home meds include glimepiride, ReliOn 70/30 insulin, 70 units twice a day -CBG stable now, continue Lantus 40 units and sliding scale insulin  Essential hypertension: now HYpotensive -Home meds include Coreg 25 mg twice daily and chlorthalidone.  Coreg was stopped on 10/25/2019 when he developed atrial fibrillation and was switched to amiodarone.  Blood pressure remains on the low side.   -Chlorthalidone on hold -I think low BP is multifactorial, due to CHF, Anemia etc, add midodrine  New onset atrial fibrillation with rapid ventricular response: - Late afternoon of 10/25/2019, patient developed new onset atrial fibrillation with rapid ventricular response.  Due to low blood pressure, he was  started on amiodarone drip as well as heparin drip.  -Cardiology following, appreciate input -Back in sinus rhythm, IV heparin was stopped in the setting of ongoing severe anemia requiring blood transfusions -Remains on IV amiodarone, plan to transition to p.o. tomorrow  Combined  systolic and diastolic congestive heart failure: - Echo shows 30 to 35% ejection fraction.   -Per cardiology, likely stress cardiomyopathy however cannot rule out coronary artery disease.  Will defer further management to cardiology. -Now volume being managed with dialysis  Hyperkalemia: Resolved.  DVT prophylaxis: Heparin SQ Code Status: Full Family Communication: No family at bedside, updated son [name] 4/4 Disposition Plan: SNF pending improvement in kidney function, A. fib RVR, metabolic encephalopathy  Consultants:   Nephrology  IR  Cardiology  Procedures:   Tunneled HD cath placed by IR 10/25/19  Repeat tunneled HD catheter placement by IR on 10/26/2019  Antimicrobials: Anti-infectives (From admission, onward)   Start     Dose/Rate Route Frequency Ordered Stop   10/26/19 0753  ceFAZolin (ANCEF) 2-4 GM/100ML-% IVPB    Note to Pharmacy: [name]   : cabinet override      10/26/19 0753 10/26/19 1959   10/25/19 1500  ceFAZolin (ANCEF) IVPB 2g/100 mL premix  Status:  Discontinued     2 g 200 mL/hr over 30 Minutes Intravenous To Radiology 10/24/19 1619 10/25/19 1916   10/25/19 1228  ceFAZolin (ANCEF) IVPB 1 g/50 mL premix     over 30 Minutes  Continuous PRN 10/25/19 1233 10/25/19 1228   10/25/19 1217  ceFAZolin (ANCEF) 2-4 GM/100ML-% IVPB    Note to Pharmacy: [name]   : cabinet override      10/25/19 1217 10/26/19 0029      Subjective: -Angry and upset, has mittens on, talking about charging people with monitor -Denies any shortness of breath, p.o. intake is poor, complains of blood quality of for food  Objective: Vitals:   10/30/19 0330 10/30/19 0407 10/30/19 0523 10/30/19 0606  BP: (!) 95/46 (!) 95/45 (!) 98/49   Pulse: 92 98 92 87  Resp: 20 20 20 19   Temp:  97.6 F (36.4 C) 98.4 F (36.9 C)   TempSrc:  Axillary Oral   SpO2:  97% 97% 97%  Weight:   107 kg   Height:        Intake/Output Summary (Last 24 hours) at 10/30/2019 1100 Last data filed  at 10/30/2019 0932 Gross per 24 hour  Intake 90 ml  Output 50 ml  Net 40 ml   Filed Weights   10/29/19 0411 10/30/19 0100 10/30/19 0523  Weight: 105.7 kg 106.7 kg 107 kg    Examination:  Gen: Elderly chronically ill male laying in bed, awake alert oriented to self and partly to place only, pleasantly confused HEENT: No JVD, right IJ HD catheter noted  lungs: Diminished breath sounds the bases, otherwise clear CVS: S1-S2, regular rate rhythm Abd: soft, Non tender, non distended, BS present Extremities: Trace edema, chronic venous stasis changes especially in left lower leg Skin: As above Psychiatry: Poor insight and judgment  Data Reviewed: I have personally reviewed following labs and imaging studies  CBC: Recent Labs  Lab 10/24/19 0539 10/26/19 0508 10/27/19 0342 10/27/19 0342 10/28/19 0702 10/28/19 0702 10/28/19 1641 10/29/19 0905 10/29/19 1528 10/29/19 2342 10/30/19 0601  WBC 100.0*   < > 149.1*  --  175.3*  --  161.1* 169.3*  --   --  174.0*  NEUTROABS 7.1  --   --   --   --   --   --   --   --   --   --  HGB 8.9*   < > 7.4*   < > 7.2*   < > 6.4* 7.1* 6.5* 7.7* 7.3*  HCT 29.1*   < > 24.3*   < > 23.4*   < > 20.8* 23.2* 21.1* 24.7* 23.3*  MCV 105.8*   < > 102.5*  --  103.5*  --  101.5* 103.1*  --   --  98.3  PLT 118*   < > 95*  --  92*  --  79* 74*  --   --  65*   < > = values in this interval not displayed.   Basic Metabolic Panel: Recent Labs  Lab 10/26/19 0508 10/26/19 1436 10/27/19 0342 10/28/19 0702 10/28/19 1641 10/29/19 0724 10/30/19 0601  NA 142   < > 146* 142 142 141 141  K 6.5*   < > 4.6 7.1* 6.0* 5.1 4.4  CL 112*   < > 106 103 105 102 105  CO2 15*   < > 21* 23 24 24 26   GLUCOSE 171*   < > 254* 258* 273* 308* 147*  BUN 141*   < > 98* 134* 81* 104* 72*  CREATININE 8.30*   < > 6.11* 7.40* 4.75* 5.66* 4.33*  CALCIUM 7.0*   < > 7.2* 7.1* 7.0* 7.2* 7.2*  MG 2.1  --   --   --   --   --   --   PHOS 6.6*   < > 5.6* 6.9* 4.8* 6.2* 5.0*   < > =  values in this interval not displayed.   GFR: Estimated Creatinine Clearance: 16.5 mL/min (A) (by C-G formula based on SCr of 4.33 mg/dL (H)). Liver Function Tests: Recent Labs  Lab 10/27/19 0342 10/28/19 0702 10/28/19 1641 10/29/19 0724 10/30/19 0601  ALBUMIN 2.5* 2.5* 2.4* 2.6* 2.3*   No results for input(s): LIPASE, AMYLASE in the last 168 hours. No results for input(s): AMMONIA in the last 168 hours. Coagulation Profile: No results for input(s): INR, PROTIME in the last 168 hours. Cardiac Enzymes: No results for input(s): CKTOTAL, CKMB, CKMBINDEX, TROPONINI in the last 168 hours. BNP (last 3 results) No results for input(s): PROBNP in the last 8760 hours. HbA1C: No results for input(s): HGBA1C in the last 72 hours. CBG: Recent Labs  Lab 10/29/19 1659 10/29/19 2031 10/30/19 0004 10/30/19 0508 10/30/19 0828  GLUCAP 192* 180* 178* 126* 122*   Lipid Profile: No results for input(s): CHOL, HDL, LDLCALC, TRIG, CHOLHDL, LDLDIRECT in the last 72 hours. Thyroid Function Tests: No results for input(s): TSH, T4TOTAL, FREET4, T3FREE, THYROIDAB in the last 72 hours. Anemia Panel: No results for input(s): VITAMINB12, FOLATE, FERRITIN, TIBC, IRON, RETICCTPCT in the last 72 hours. Sepsis Labs: No results for input(s): PROCALCITON, LATICACIDVEN in the last 168 hours.  No results found for this or any previous visit (from the past 240 hour(s)).   Radiology Studies: No results found.  Scheduled Meds: . sodium chloride   Intravenous Once  . aspirin EC  81 mg Oral Daily  . Chlorhexidine Gluconate Cloth  6 each Topical Q0600  . feeding supplement (PRO-STAT SUGAR FREE 64)  30 mL Per Tube BID  . insulin aspart  0-15 Units Subcutaneous Q4H  . insulin glargine  40 Units Subcutaneous Daily  . sodium chloride flush  10-40 mL Intracatheter Q12H   Continuous Infusions: . sodium chloride    . sodium chloride    . sodium chloride    . sodium chloride    . sodium chloride    .  sodium chloride    . amiodarone 30 mg/hr (10/30/19 0539)  . feeding supplement (OSMOLITE 1.5 CAL) Stopped (10/29/19 0001)     LOS: 12 days   Total time spent 45 minutes  Domenic Polite, MD [facility]  10/30/2019, 11:00 AM

## 2019-10-30 NOTE — Progress Notes (Signed)
[facility] ROUNDING NOTE   Subjective:   This is an 86-year gentleman with a history of CLL with acute on chronic kidney disease stage III.  He was admitted from the oncology office for abnormal labs including a creatinine of 4.58.  On 10/03/2019 blood work showed a creatinine of 2.59.  At that time he also had a rash having recently had venetoclax for treatment of his CLL.  He was started on oral steroids.  In addition to this the patient was also on chlorthalidone and PPI all of which could have contributed to the rash.  Is acute on chronic kidney failure was thought to be secondary to either acute interstitial nephritis or acute tubular necrosis.  He was transferred from [facility] hospital 10/24/2019 to [facility] for initiation of dialysis.  It was thought that his fluctuating mental status was secondary to uremia.  Patient has become increasingly more confused.  He pulled out his  dialysis cath 10/24/2029.   He had another dialysis catheter placed 10/27/2019.  Underwent dialysis #3 for 10/29/2019     Blood pressure 98/49 pulse 107 temperature 98.4 O2 sats 97% room air  Sodium 141 potassium 4.4 chloride 105 CO2 26 BUN 72 creatinine 4.33 glucose 147 calcium 7.2 phosphorus 5.0 albumin 2.3 hemoglobin 7.3 WBC 174  Aspirin 81 mg daily, insulin sliding scale, amiodarone IV 30 mg/h    Objective:  Vital signs in last 24 hours:  Temp:  [97.4 F (36.3 C)-98.4 F (36.9 C)] 98.4 F (36.9 C) (04/04 0523) Pulse Rate:  [50-100] 87 (04/04 0606) Resp:  [13-30] 19 (04/04 0606) BP: (85-117)/(40-68) 98/49 (04/04 0523) SpO2:  [97 %-100 %] 97 % (04/04 0606) Weight:  [106.7 kg-107 kg] 107 kg (04/04 0523)  Weight change: 1.4 kg Filed Weights   10/29/19 0411 10/30/19 0100 10/30/19 0523  Weight: 105.7 kg 106.7 kg 107 kg    Intake/Output: I/O last 3 completed shifts: In: 409.6 [P.O.:90; Blood:319.6] Out: 50 [Urine:250]   Intake/Output this shift:  No intake/output data  recorded.  Elderly male nondistressed  CVS- RRR no rubs RS-clear bilaterally ABD- BS present soft non-distended Foley catheter EXT- no edema   Basic Metabolic Panel: Recent Labs  Lab 10/26/19 0508 10/26/19 1436 10/27/19 0342 10/27/19 0342 10/28/19 0702 10/28/19 0702 10/28/19 1641 10/29/19 0724 10/30/19 0601  NA 142   < > 146*  --  142  --  142 141 141  K 6.5*   < > 4.6  --  7.1*  --  6.0* 5.1 4.4  CL 112*   < > 106  --  103  --  105 102 105  CO2 15*   < > 21*  --  23  --  24 24 26   GLUCOSE 171*   < > 254*  --  258*  --  273* 308* 147*  BUN 141*   < > 98*  --  134*  --  81* 104* 72*  CREATININE 8.30*   < > 6.11*  --  7.40*  --  4.75* 5.66* 4.33*  CALCIUM 7.0*   < > 7.2*   < > 7.1*   < > 7.0* 7.2* 7.2*  MG 2.1  --   --   --   --   --   --   --   --   PHOS 6.6*   < > 5.6*  --  6.9*  --  4.8* 6.2* PENDING   < > = values in this interval not displayed.  Liver Function Tests: Recent Labs  Lab 10/27/19 0342 10/28/19 0702 10/28/19 1641 10/29/19 0724 10/30/19 0601  ALBUMIN 2.5* 2.5* 2.4* 2.6* 2.3*   No results for input(s): LIPASE, AMYLASE in the last 168 hours. No results for input(s): AMMONIA in the last 168 hours.  CBC: Recent Labs  Lab 10/24/19 0539 10/26/19 0508 10/27/19 0342 10/27/19 0342 10/28/19 0702 10/28/19 0702 10/28/19 1641 10/29/19 0905 10/29/19 1528 10/29/19 2342 10/30/19 0601  WBC 100.0*   < > 149.1*  --  175.3*  --  161.1* 169.3*  --   --  174.0*  NEUTROABS 7.1  --   --   --   --   --   --   --   --   --   --   HGB 8.9*   < > 7.4*   < > 7.2*   < > 6.4* 7.1* 6.5* 7.7* 7.3*  HCT 29.1*   < > 24.3*   < > 23.4*   < > 20.8* 23.2* 21.1* 24.7* 23.3*  MCV 105.8*   < > 102.5*  --  103.5*  --  101.5* 103.1*  --   --  98.3  PLT 118*   < > 95*  --  92*  --  79* 74*  --   --  65*   < > = values in this interval not displayed.    Cardiac Enzymes: No results for input(s): CKTOTAL, CKMB, CKMBINDEX, TROPONINI in the last 168 hours.  BNP: Invalid  input(s): POCBNP  CBG: Recent Labs  Lab 10/29/19 1201 10/29/19 1659 10/29/19 2031 10/30/19 0004 10/30/19 0508  GLUCAP 186* 192* 180* 178* 126*    Microbiology: Results for orders placed or performed during the hospital encounter of 10/18/19  SARS CORONAVIRUS 2 (TAT 6-24 HRS) Nasopharyngeal Nasopharyngeal Swab     Status: None   Collection Time: 10/18/19  2:49 PM   Specimen: Nasopharyngeal Swab  Result Value Ref Range Status   SARS Coronavirus 2 NEGATIVE NEGATIVE Final    Comment: (NOTE) SARS-CoV-2 target nucleic acids are NOT DETECTED. The SARS-CoV-2 RNA is generally detectable in upper and lower respiratory specimens during the acute phase of infection. Negative results do not preclude SARS-CoV-2 infection, do not rule out co-infections with other pathogens, and should not be used as the sole basis for treatment or other patient management decisions. Negative results must be combined with clinical observations, patient history, and epidemiological information. The expected result is Negative. Fact Sheet for Patients: SugarRoll.be Fact Sheet for Healthcare Providers: https://www.woods-mathews.com/ This test is not yet approved or cleared by the [place] FDA and  has been authorized for detection and/or diagnosis of SARS-CoV-2 by FDA under an Emergency Use Authorization (EUA). This EUA will remain  in effect (meaning this test can be used) for the duration of the COVID-19 declaration under Section 56 4(b)(1) of the Act, 21 U.S.C. section 360bbb-3(b)(1), unless the authorization is terminated or revoked sooner. Performed at [facility], [place] [address] [place], [place] [address]     Coagulation Studies: No results for input(s): LABPROT, INR in the last 72 hours.  Urinalysis: No results for input(s): COLORURINE, LABSPEC, PHURINE, GLUCOSEU, HGBUR, BILIRUBINUR, KETONESUR, PROTEINUR, UROBILINOGEN, NITRITE, LEUKOCYTESUR in  the last 72 hours.  Invalid input(s): APPERANCEUR    Imaging: No results found.   Medications:   . sodium chloride    . sodium chloride    . sodium chloride    . sodium chloride    . sodium chloride    .  sodium chloride    . amiodarone 30 mg/hr (10/30/19 0539)  . feeding supplement (OSMOLITE 1.5 CAL) Stopped (10/29/19 0001)   . sodium chloride   Intravenous Once  . aspirin EC  81 mg Oral Daily  . Chlorhexidine Gluconate Cloth  6 each Topical Q0600  . Chlorhexidine Gluconate Cloth  6 each Topical Q0600  . feeding supplement (PRO-STAT SUGAR FREE 64)  30 mL Per Tube BID  . insulin aspart  0-15 Units Subcutaneous Q4H  . insulin glargine  40 Units Subcutaneous Daily  . sodium chloride flush  10-40 mL Intracatheter Q12H   sodium chloride, sodium chloride, sodium chloride, sodium chloride, sodium chloride, sodium chloride, acetaminophen **OR** acetaminophen, alteplase, famotidine, heparin, labetalol, lidocaine (PF), lidocaine-prilocaine, mirtazapine, ondansetron **OR** ondansetron (ZOFRAN) IV, pentafluoroprop-tetrafluoroeth, sodium chloride flush  Assessment/ Plan:   Acute on chronic kidney disease.  Etiology unclear ultrasound without obstruction possible acute interstitial nephritis in view of rash.  Short-term dialysis anticipating some recovery of GFR.  Patient with fairly pronounced azotemia.  Dialysis catheter placed 10/25/2019 patient agitated and confused to remove dialysis catheter.  Placement of dialysis catheter 10/26/2019.  Dialysis #3 10/30/2019.  We will plan next dialysis treatment 10/31/2019  Hyperkalemia this may be spurious hyperkalemia.  In any case we are going to dialyze patient.  Dialyze patient 10/30/2019  CLL follow-up with Dr. [name].  Would continue with close follow-up with oncology.  White blood count seems to be critically elevated agree with close supervision by oncology.  Appreciate assistance from Dr. [name].  Leukostasis appears to be rare below  400,000.  Hypotension.  Atrial fibrillation with rapid ventricular rate.  This may be problematic and limits his ability to receive dialysis treatments.  Metabolic acidosis resolved    Anemia followed by heme appreciate assistance  Atrial fibrillation started on IV amiodarone.   LOS: [place] @TODAY @8 :20 AM

## 2019-10-30 NOTE — Significant Event (Signed)
Rapid Response Event Note  Overview:Asked to see pt on rounds d/t hypotension. BP-70s.   Initial Focused Assessment: Pt laying in bed with eyes closed, awakens easily. Pt is alert but confused(baseline). Pt is belly breathing(not new for pt). Pt denies chest pain, SOB, or lightheadedness/dizziness.  Lungs with scattered crackles t/o. Skin cool to touch. Pitting edema to BLE. Foley catheter has approx. 100cc dark amber urine(this is since 5AM this morning). Pt had HD early this AM with 250 cc bolus given d/t hypotension.  T-98.2, HR-88(Afib), BP-79/61(manual BP done PTA RRT-SBP-80s), RR-22, Spo2-98% on 2L [place]. Pt on amiod gtt for afib.   Interventions: 100cc NS given x 2 per PRN HD order PTA RRT([place], NP notified that this was given) 5mg  midodrine PO Plan of Care (if not transferred): BP now 104/41 post 200cc NS bolus.. Pt mental status remained at pt baseline despite hypotensive episode. Give midodrine and monitor response. Pt's lungs already with scattered crackles and  HD has been unable to remove fluid d/t hypotension. If pt develops hypotension again, I don't think fluid is going to be the best option.   Please monitor closely and call RRT if further assistance needed.  Event Summary: Asked to see pt on rounds at 2110: Benjamin [place], NP notified at 2125  Benjamin Watkins, Benjamin Watkins

## 2019-10-31 ENCOUNTER — Encounter (HOSPITAL_COMMUNITY): Payer: Self-pay | Admitting: Internal Medicine

## 2019-10-31 DIAGNOSIS — C9110 Chronic lymphocytic leukemia of B-cell type not having achieved remission: Secondary | ICD-10-CM

## 2019-10-31 DIAGNOSIS — Z66 Do not resuscitate: Secondary | ICD-10-CM

## 2019-10-31 DIAGNOSIS — I4891 Unspecified atrial fibrillation: Secondary | ICD-10-CM

## 2019-10-31 DIAGNOSIS — I5042 Chronic combined systolic (congestive) and diastolic (congestive) heart failure: Secondary | ICD-10-CM

## 2019-10-31 DIAGNOSIS — Z7189 Other specified counseling: Secondary | ICD-10-CM

## 2019-10-31 DIAGNOSIS — R1310 Dysphagia, unspecified: Secondary | ICD-10-CM

## 2019-10-31 DIAGNOSIS — Z515 Encounter for palliative care: Secondary | ICD-10-CM

## 2019-10-31 LAB — RENAL FUNCTION PANEL
Albumin: 2.4 g/dL — ABNORMAL LOW (ref 3.5–5.0)
Anion gap: 15 (ref 5–15)
BUN: 95 mg/dL — ABNORMAL HIGH (ref 8–23)
CO2: 23 mmol/L (ref 22–32)
Calcium: 7.2 mg/dL — ABNORMAL LOW (ref 8.9–10.3)
Chloride: 103 mmol/L (ref 98–111)
Creatinine, Ser: 5.63 mg/dL — ABNORMAL HIGH (ref 0.61–1.24)
GFR calc Af Amer: 10 mL/min — ABNORMAL LOW (ref >60)
GFR calc non Af Amer: 9 mL/min — ABNORMAL LOW (ref >60)
Glucose, Bld: 94 mg/dL (ref 70–99)
Phosphorus: 6.8 mg/dL — ABNORMAL HIGH (ref 2.5–4.6)
Potassium: 5.5 mmol/L — ABNORMAL HIGH (ref 3.5–5.1)
Sodium: 141 mmol/L (ref 135–145)

## 2019-10-31 LAB — CBC
HCT: 23.5 % — ABNORMAL LOW (ref 39.0–52.0)
Hemoglobin: 7 g/dL — ABNORMAL LOW (ref 13.0–17.0)
MCH: 29.8 pg (ref 26.0–34.0)
MCHC: 29.8 g/dL — ABNORMAL LOW (ref 30.0–36.0)
MCV: 100 fL (ref 80.0–100.0)
Platelets: 71 10*3/uL — ABNORMAL LOW (ref 150–400)
RBC: 2.35 MIL/uL — ABNORMAL LOW (ref 4.22–5.81)
RDW: 20.1 % — ABNORMAL HIGH (ref 11.5–15.5)
WBC: 183.2 10*3/uL (ref 4.0–10.5)
nRBC: 0.2 % (ref 0.0–0.2)

## 2019-10-31 LAB — GLUCOSE, CAPILLARY
Glucose-Capillary: 78 mg/dL (ref 70–99)
Glucose-Capillary: 93 mg/dL (ref 70–99)
Glucose-Capillary: 91 mg/dL (ref 70–99)
Glucose-Capillary: 131 mg/dL — ABNORMAL HIGH (ref 70–99)

## 2019-10-31 LAB — PREPARE RBC (CROSSMATCH)

## 2019-10-31 MED ORDER — FENTANYL CITRATE (PF) 100 MCG/2ML IJ SOLN: 12.5000 ug | INTRAMUSCULAR | Status: DC | PRN

## 2019-10-31 MED ORDER — BIOTENE DRY MOUTH MT LIQD
15.0000 mL | Freq: Two times a day (BID) | OROMUCOSAL | Status: DC
  Administered 2019-10-31 – 2019-11-01 (×2): 15 mL via OROMUCOSAL

## 2019-10-31 MED ORDER — GLYCOPYRROLATE 0.2 MG/ML IJ SOLN
0.4000 mg | INTRAMUSCULAR | Status: DC
  Administered 2019-10-31 – 2019-11-01 (×5): 0.4 mg via INTRAVENOUS
  Filled 2019-10-31 (×5): qty 2

## 2019-10-31 MED ORDER — ALTEPLASE 2 MG IJ SOLR: 2.0000 mg | Freq: Once | INTRAMUSCULAR | Status: DC | PRN

## 2019-10-31 MED ORDER — LIDOCAINE HCL (PF) 1 % IJ SOLN: 5.0000 mL | INTRAMUSCULAR | Status: DC | PRN

## 2019-10-31 MED ORDER — LORAZEPAM 2 MG/ML IJ SOLN: 0.5000 mg | INTRAMUSCULAR | Status: DC | PRN

## 2019-10-31 MED ORDER — HEPARIN SODIUM (PORCINE) 1000 UNIT/ML IJ SOLN
INTRAMUSCULAR | Status: AC
  Administered 2019-10-31: 3200 [IU] via INTRAVENOUS_CENTRAL
  Filled 2019-10-31: qty 4

## 2019-10-31 MED ORDER — CALCIUM ACETATE (PHOS BINDER) 667 MG PO CAPS: 667.0000 mg | ORAL_CAPSULE | Freq: Three times a day (TID) | ORAL | Status: DC

## 2019-10-31 MED ORDER — MIDODRINE HCL 5 MG PO TABS: 5.0000 mg | ORAL_TABLET | Freq: Two times a day (BID) | ORAL | Status: DC

## 2019-10-31 MED ORDER — FENTANYL 2500MCG IN NS 250ML (10MCG/ML) PREMIX INFUSION
0.0000 ug/h | INTRAVENOUS | Status: DC
  Administered 2019-10-31: 25 ug/h via INTRAVENOUS
  Administered 2019-11-01: 50 ug/h via INTRAVENOUS
  Filled 2019-10-31 (×2): qty 250

## 2019-10-31 MED ORDER — AMIODARONE HCL 200 MG PO TABS
200.0000 mg | ORAL_TABLET | Freq: Two times a day (BID) | ORAL | Status: DC
  Administered 2019-10-31: 09:00:00 200 mg via ORAL
  Filled 2019-10-31: qty 1

## 2019-10-31 MED ORDER — SODIUM CHLORIDE 0.9 % IV SOLN: 100.0000 mL | INTRAVENOUS | Status: DC | PRN

## 2019-10-31 MED ORDER — PENTAFLUOROPROP-TETRAFLUOROETH EX AERO: 1.0000 "application " | INHALATION_SPRAY | CUTANEOUS | Status: DC | PRN

## 2019-10-31 MED ORDER — AMIODARONE HCL 200 MG PO TABS: 400.0000 mg | ORAL_TABLET | Freq: Two times a day (BID) | ORAL | Status: DC

## 2019-10-31 MED ORDER — LIDOCAINE-PRILOCAINE 2.5-2.5 % EX CREA: 1.0000 "application " | TOPICAL_CREAM | CUTANEOUS | Status: DC | PRN

## 2019-10-31 MED ORDER — SODIUM CHLORIDE 0.9% IV SOLUTION: Freq: Once | INTRAVENOUS | Status: DC

## 2019-10-31 MED ORDER — HEPARIN SODIUM (PORCINE) 1000 UNIT/ML DIALYSIS: 1000.0000 [IU] | INTRAMUSCULAR | Status: DC | PRN

## 2019-10-31 MED ORDER — POLYVINYL ALCOHOL 1.4 % OP SOLN
2.0000 [drp] | OPHTHALMIC | Status: DC | PRN
  Filled 2019-10-31: qty 15

## 2019-10-31 MED ORDER — MIDODRINE HCL 5 MG PO TABS: 10.0000 mg | ORAL_TABLET | Freq: Three times a day (TID) | ORAL | Status: DC

## 2019-10-31 MED ORDER — MIDODRINE HCL 5 MG PO TABS
10.0000 mg | ORAL_TABLET | Freq: Two times a day (BID) | ORAL | Status: DC
  Administered 2019-10-31: 10:00:00 10 mg via ORAL
  Filled 2019-10-31: qty 2

## 2019-10-31 NOTE — Progress Notes (Signed)
Pt poorly tolerated HD tx, with hypotension noted throughout tx. Unable to meet Uf goal. Dr. Carolin Sicks at bedside and made aware. Pt noted with labored breathing pre and throughout tx. O2 sats remained within limits. Generalized edema noted.

## 2019-10-31 NOTE — Consult Note (Addendum)
Consultation Note Date: 10/31/2019   Patient Name: Benjamin Watkins  DOB: 26-Jul-1933  MRN: [id number]  Age / Sex: 86 y.o., male  PCP: [name], MD Referring Physician: Domenic Polite, MD  Reason for Consultation: Establishing goals of care  HPI/Patient Profile:  Per hospitalist note --> 51 y.o.malewith PMH of CLL, HTN, DM2, ckd3 Patient was sent to ED from oncology office for abnormal labs including creatinine of 4.58.  Patient follows up with Dr. [name] for CLL, was recently started on venetoclax Repeat blood work was obtained on 3/23. noted creatinine of 4.58. Patient was admitted to hospitalist service for acute kidney injury on CKD stage 3 at [facility] course complicated by metabolic encephalopathy, uremia, A. fib RVR, systolic CHF -Subsequently transferred to [facility] to start hemodialysis -Started HD on 3/31 due to hyperkalemia and uremia  Palliative care was asked to get involved for ongoing [place] conversations.   Clinical Assessment and Goals of Care: I have reviewed medical records including EPIC notes, labs and imaging, received report from bedside RN, assessed the patient.    I called Anav Lammert (son) to further discuss diagnosis prognosis, GOC, EOL wishes, disposition and options.   I introduced Palliative Medicine as specialized medical care for people living with serious illness. It focuses on providing relief from the symptoms and stress of a serious illness. The goal is to improve quality of life for both the patient and the family.  I asked [name] to give me a better impression of Harris as a man. He shared that Jony is from [place]. 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 He has been married to [place] (wife) for 34 years. They have two children, [name] and [employer]. He was a [place] man though has not  practiced in many years.   In regard to Christina's physical state prior to hospitalization, [name] describes that he was slow but able to do things. He was able to mobilize up the stairs and to the kitchen. He was taking his medications on his own.  [name] shares that things have been more difficult since Benjamin Watkins has been in the hospital. This time has enabled [name] and his brother Fatima Sanger to identify how significant the dementia is in their mother, Benjamin Watkins. He shares that his mother was unable to get home for roughly four hours after she left the hospital the other night. He said that he has now taken her keys away. He realized that she neglected to pay the power bill resulting in them almost facing their electricity being turned off. He shared that this is very stressful on him as he did not realize until now how profound her dementia is. He requested resources on memory care facilities.  I asked [name] to give me an impression of Benjamin Watkins's kidney disease. [name] shares that he had been started on dialysis temporarily to see whether or not his kidneys could improve. After a few dialysis treatments [name] had been told that his fathers kidneys are  not improving. He also shares that if they are not going to reach a meaning recovery that [name] would have never wanted to be on long term hemodialysis.   A detailed discussion was had today regarding advanced directives, [name] said that he thinks that there are some in the patients safe though he does not presently have access to that.    Concepts specific to code status, artifical feeding and hydration, continued IV antibiotics and rehospitalization was had.  [name] shares that he has spoken to Dr. Broadus John yesterday and spoke to his mother and brother yesterday. They all agree that he would not want resuscitation, intubation, gastrostomy tubes, or long term hemodialysis.   I shared with [name] that the medical teams do not feel that Lola has a likelihood of kidney recovery. I  shared with [name] that in situations such as these we shift our focus from curative to symptom based. We talked about transition to a hospice home and what that would entail inclusive of medications to control pain, dyspnea, agitation, nausea, itching, and hiccups.  Again, reviewed the potential to stop all uneccessary measures such as blood draws, needle sticks, and frequent vital signs. Utilized reflective listening throughout our time together.    [name] said that if the medical teams do not feel Benjamin Watkins would benefit from additional HD interventions he would be interested in a transition to more of a comfort emphasis. He said if there is any glimmer of hope that he would like to continue trying dialysis for another 1-2 treatments.   I asked [name] based upon what we discussed if we could send inquiries to the local residential hospice homes which he was open to.  Discussed the importance of continued conversation with family and their  medical providers regarding overall plan of care and treatment options, ensuring decisions are within the context of the patients values and GOCs.  Decision Maker: Benjamin Watkins (son) [phone]  SUMMARY OF RECOMMENDATIONS   DNAR/DNI  Patient would not want long term HD though if there is any hope for recovery son would wish for another 1-2 treatments. In the meanwhile he realizes there would likely be no improvement and is open to [place] sending a referral to residential hospice homes. I shared with son that patient is getting HD today which could be telling. He understood this.   TOC --> Consult for Residential Hospice Home  SW --> Consult for ALF information for patients wife (memory impairment)  Code Status/Advance Care Planning:  DNR  Symptom Management:  Dysphagia:  - Swallow evaluation   - Fine chopped meals with thin liquids  Xerostomia:                 - Good oral care QShift                 - Encourage liquid intake  Delirium:                 -  Delirium precautions                 - Get up during the day                 - Encourage a familiar face to remain present throughout the day                 - Keep blinds open and lights on during daylight hours                 -  Minimize the use of opioids/benzodiazepines  Active Hospital Problems: Acute kidney injury on HWE9H Acute metabolic encephalopathy CLL (chronic lymphocytic leukemia) Anemia of chronic disease DM (diabetes mellitus), type 2  Essential hypertension: now HYpotensive New onset atrial fibrillation with rapid ventricular response: Combined systolic and diastolic congestive heart failure: Hyperkalemia  Palliative Prophylaxis:   Aspiration, Bowel Regimen, Delirium Protocol, Eye Care, Frequent Pain Assessment, Oral Care, Palliative Wound Care and Turn Reposition  Additional Recommendations (Limitations, Scope, Preferences):  Treat what is treatable  Psycho-social/Spiritual:   Desire for further Chaplaincy support: No  Additional Recommendations: Caregiving  Support/Resources and Education on Hospice  Prognosis:   < 2 weeks  Discharge Planning: Hospice facility      Primary Diagnoses: Present on Admission: . AKI (acute kidney injury) ([place])   I have reviewed the medical record, interviewed the patient and family, and examined the patient. The following aspects are pertinent.  Past Medical History:  Diagnosis Date  . CLL (chronic lymphocytic leukemia) ([place])   . Diabetes mellitus without complication ([place])   . Diverticulosis   . History of colon polyps   . Hypertension   . Low back pain   . Right foot drop    Social History   Socioeconomic History  . Marital status: Married    Spouse name: Not on file  . Number of children: 1  . Years of education: some college  . Highest education level: Not on file  Occupational History  . Occupation: Retired  Tobacco Use  . Smoking status: Never Smoker  . Smokeless tobacco: Never Used  Substance  and Sexual Activity  . Alcohol use: No  . Drug use: No  . Sexual activity: Not on file  Other Topics Concern  . Not on file  Social History Narrative   Lives at home with his wife.    Right-handed.    0.5 cup of coffee each morning, some tea.   Social Determinants of Health   Financial Resource Strain:   . Difficulty of Paying Living Expenses:   Food Insecurity:   . Worried About Charity fundraiser in the Last Year:   . Arboriculturist in the Last Year:   Transportation Needs:   . Film/video editor (Medical):   Marland Kitchen Lack of Transportation (Non-Medical):   Physical Activity:   . Days of Exercise per Week:   . Minutes of Exercise per Session:   Stress:   . Feeling of Stress :   Social Connections:   . Frequency of Communication with Friends and Family:   . Frequency of Social Gatherings with Friends and Family:   . Attends Religious Services:   . Active Member of Clubs or Organizations:   . Attends Archivist Meetings:   Marland Kitchen Marital Status:    Family History  Problem Relation Age of Onset  . Stroke Mother   . Diabetes Mother   . Other Father        "old age"   Scheduled Meds: . sodium chloride   Intravenous Once  . amiodarone  200 mg Oral BID  . aspirin EC  81 mg Oral Daily  . calcium acetate  667 mg Oral TID WC  . Chlorhexidine Gluconate Cloth  6 each Topical Q0600  . feeding supplement (PRO-STAT SUGAR FREE 64)  30 mL Per Tube BID  . heparin injection (subcutaneous)  5,000 Units Subcutaneous Q8H  . insulin aspart  0-15 Units Subcutaneous Q4H  . midodrine  10 mg Oral TID WC  .  sodium chloride flush  10-40 mL Intracatheter Q12H   Continuous Infusions: . sodium chloride    . sodium chloride    . sodium chloride    . sodium chloride    . feeding supplement (OSMOLITE 1.5 CAL) Stopped (10/29/19 0001)   PRN Meds:.sodium chloride, sodium chloride, sodium chloride, sodium chloride, acetaminophen **OR** acetaminophen, alteplase, famotidine, heparin,  labetalol, lidocaine (PF), lidocaine-prilocaine, mirtazapine, ondansetron **OR** ondansetron (ZOFRAN) IV, pentafluoroprop-tetrafluoroeth, sodium chloride flush Medications Prior to Admission:  Prior to Admission medications   Medication Sig Start Date End Date Taking? Authorizing Provider  acetaminophen (TYLENOL) 325 MG tablet Take 650 mg by mouth as needed for mild pain.    Yes [provider]  aspirin EC 81 MG tablet Take 81 mg by mouth daily.   Yes [provider]  carvedilol (COREG) 25 MG tablet Take 25 mg by mouth 2 (two) times daily with a meal.   Yes [provider]  chlorthalidone (HYGROTON) 25 MG tablet Take 1 tablet (25 mg total) by mouth every other day. 10/18/19  Yes Brunetta Genera, MD  Cyanocobalamin (VITAMIN B-12) 1000 MCG SUBL PLACE 1  UNDER THE TONGUE ONCE DAILY Patient taking differently: Take 1,000 mcg by mouth daily.  10/03/19  Yes Brunetta Genera, MD  glimepiride (AMARYL) 2 MG tablet Take 2 mg by mouth daily with breakfast.   Yes [provider]  niacin 500 MG tablet Take 500 mg by mouth at bedtime.   Yes [provider]  NOVOLIN 70/30 RELION (70-30) 100 UNIT/ML injection Inject 70-74 Units into the skin 2 (two) times daily. Takes 74 units in the morning and 70 units at night. 08/27/15  Yes [provider]  omeprazole (PRILOSEC) 20 MG capsule Take 20 mg by mouth daily. 09/05/15  Yes [provider]  Polyethyl Glycol-Propyl Glycol (SYSTANE ULTRA OP) Place 2 drops into both eyes daily as needed (For dry eyes.).   Yes [provider]  venetoclax 100 MG TABS Take 200 mg by mouth daily. 09/26/19   Brunetta Genera, MD   Allergies  Allergen Reactions  . Hydrochlorothiazide Other (See Comments)    "It just bothers me"  . Lisinopril Other (See Comments)    "Takes away energy"   Vital Signs: BP (!) 99/49   Pulse 76   Temp (!) 97.5 F (36.4 C) (Oral)   Resp (!) 30   Ht 6\' 2"  (1.88 m)   Wt 104.2 kg    SpO2 100%   BMI 29.49 kg/m  Pain Scale: 0-10 POSS *See Group Information*: 1-Acceptable,Awake and alert Pain Score: 0-No pain  SpO2: SpO2: 100 % O2 Device:SpO2: 100 % O2 Flow Rate: .O2 Flow Rate (L/min): 2 L/min  IO: Intake/output summary:   Intake/Output Summary (Last 24 hours) at 10/31/2019 1536 Last data filed at 10/31/2019 0800 Gross per 24 hour  Intake 1006.73 ml  Output 200 ml  Net 806.73 ml    LBM: Last BM Date: 10/30/19 Baseline Weight: Weight: 98.9 kg Most recent weight: Weight: 104.2 kg     Palliative Assessment/Data: 20%  Time In: 1500 Time Out: 1610 Time Total: 70 Greater than 50%  of this time was spent counseling and coordinating care related to the above assessment and plan.  Signed by: Rosezella Rumpf, NP   Please contact Palliative Medicine Team phone at [phone] for questions and concerns.  For individual provider: See Shea Evans

## 2019-10-31 NOTE — NC FL2 (Signed)
[place] LEVEL OF CARE SCREENING TOOL     IDENTIFICATION  Patient Name: Benjamin Watkins Birthdate: 1932/11/18 Sex: male Admission Date (Current Location): 10/18/2019  [facility] and [place] Number:  Herbalist and Address:  [facility] [place], [place] [address], [place], [place] [address]      Provider Number: [id number]  Attending Physician Name and Address:  Domenic Polite, MD  Relative Name and Phone Number:       Current Level of Care: Hospital Recommended Level of Care: [place] Prior Approval Number:    Date Approved/Denied:   PASRR Number: [id number]  Discharge Plan: SNF    Current Diagnoses: Patient Active Problem List   Diagnosis Date Noted  . Pressure injury of skin 10/27/2019  . Malnutrition of moderate degree 10/26/2019  . AKI (acute kidney injury) ([place]) 10/18/2019  . Dehydration 10/18/2019  . CLL (chronic lymphocytic leukemia) ([place]) 10/18/2019  . Anemia 10/18/2019  . DM (diabetes mellitus), type 2 ([place]) 10/18/2019  . Gait abnormality 04/05/2018  . Right foot drop 02/05/2018    Orientation RESPIRATION BLADDER Height & Weight     Self, Place, Time  O2(2L nasal canula) Incontinent, Indwelling catheter Weight: 236 lb 15.9 oz (107.5 kg) Height:  6\' 2"  (188 cm)  BEHAVIORAL SYMPTOMS/MOOD NEUROLOGICAL BOWEL NUTRITION STATUS      Continent Diet(see discharge summary)  AMBULATORY STATUS COMMUNICATION OF NEEDS Skin   Extensive Assist Verbally PU Stage and Appropriate Care, Skin abrasions, Other (Comment)(stage 2 pu on buttocks; generalized ecchymosis; excoriation on chest w/ foam, generalized rash, generalized ecchymosis)   PU Stage 2 Dressing: Daily(on buttocks w/ foam)                   Personal Care Assistance Level of Assistance  Bathing, Feeding, Dressing Bathing Assistance: Maximum assistance Feeding assistance: Limited assistance Dressing Assistance: Maximum assistance     Functional  Limitations Info  Sight, Hearing, Speech Sight Info: Impaired Hearing Info: Impaired Speech Info: Adequate    SPECIAL CARE FACTORS FREQUENCY  OT (By licensed OT), PT (By licensed PT)     PT Frequency: 5x week OT Frequency: 5x week            Contractures Contractures Info: Not present    Additional Factors Info  Code Status, Allergies, Insulin Sliding Scale Code Status Info: Full Code Allergies Info: Hydrochlorothiazide, Lisinopril   Insulin Sliding Scale Info: insulin aspart (novoLOG) injection 0-15 Units every 4 hours       Current Medications (10/31/2019):  This is the current hospital active medication list Current Facility-Administered Medications  Medication Dose Route Frequency Provider Last Rate Last Admin  . 0.9 %  sodium chloride infusion (Manually program via Guardrails IV Fluids)   Intravenous Once Domenic Polite, MD      . 0.9 %  sodium chloride infusion  100 mL Intravenous PRN Edrick Oh, MD      . 0.9 %  sodium chloride infusion  100 mL Intravenous PRN Edrick Oh, MD      . 0.9 %  sodium chloride infusion  100 mL Intravenous PRN Edrick Oh, MD      . 0.9 %  sodium chloride infusion  100 mL Intravenous PRN Edrick Oh, MD      . acetaminophen (TYLENOL) tablet 650 mg  650 mg Oral Q6H PRN Donne Hazel, MD       Or  . acetaminophen (TYLENOL) suppository 650 mg  650 mg Rectal Q6H PRN Wyline Copas,  Orpah Melter, MD      . alteplase (CATHFLO ACTIVASE) injection 2 mg  2 mg Intracatheter Once PRN Edrick Oh, MD      . amiodarone (PACERONE) tablet 200 mg  200 mg Oral BID Sueanne Margarita, MD   200 mg at 10/31/19 0915  . aspirin EC tablet 81 mg  81 mg Oral Daily Donne Hazel, MD   81 mg at 10/31/19 0915  . Chlorhexidine Gluconate Cloth 2 % PADS 6 each  6 each Topical Q0600 Edrick Oh, MD   6 each at 10/31/19 [id number]  . famotidine (PEPCID) tablet 20 mg  20 mg Per Tube Daily PRN Donnamae Jude, RPH   20 mg at 10/28/19 2040  . feeding supplement (OSMOLITE 1.5 CAL) liquid  1,000 mL  1,000 mL Per Tube Continuous Darliss Cheney, MD   Stopped at 10/29/19 0001  . feeding supplement (PRO-STAT SUGAR FREE 64) liquid 30 mL  30 mL Per Tube BID Darliss Cheney, MD   30 mL at 10/30/19 1222  . heparin injection 1,000 Units  1,000 Units Dialysis PRN Edrick Oh, MD      . heparin injection 5,000 Units  5,000 Units Subcutaneous Q8H Domenic Polite, MD   5,000 Units at 10/31/19 [id number]  . insulin aspart (novoLOG) injection 0-15 Units  0-15 Units Subcutaneous Q4H Kirby-Graham, Karsten Fells, NP   2 Units at 10/31/19 0000  . labetalol (NORMODYNE) injection 5 mg  5 mg Intravenous Q2H PRN Donne Hazel, MD   5 mg at 10/25/19 1810  . lidocaine (PF) (XYLOCAINE) 1 % injection 5 mL  5 mL Intradermal PRN Edrick Oh, MD      . lidocaine-prilocaine (EMLA) cream 1 application  1 application Topical PRN Edrick Oh, MD      . midodrine (PROAMATINE) tablet 10 mg  10 mg Oral BID WC Domenic Polite, MD   10 mg at 10/31/19 1014  . mirtazapine (REMERON) tablet 15 mg  15 mg Oral QHS PRN Donne Hazel, MD      . ondansetron [facility]) tablet 4 mg  4 mg Oral Q6H PRN Donne Hazel, MD       Or  . ondansetron [facility]) injection 4 mg  4 mg Intravenous Q6H PRN Donne Hazel, MD      . pentafluoroprop-tetrafluoroeth Landry Dyke) aerosol 1 application  1 application Topical PRN Edrick Oh, MD      . sodium chloride flush (NS) 0.9 % injection 10-40 mL  10-40 mL Intracatheter Q12H Donne Hazel, MD   10 mL at 10/29/19 2047  . sodium chloride flush (NS) 0.9 % injection 10-40 mL  10-40 mL Intracatheter PRN Donne Hazel, MD   10 mL at 10/29/19 1113     Discharge Medications: Please see discharge summary for a list of discharge medications.  Relevant Imaging Results:  Relevant Lab Results:   Additional Information [id number] [place], [place]

## 2019-10-31 NOTE — Progress Notes (Addendum)
Progress Note  Patient Name: Benjamin Watkins Date of Encounter: 10/31/2019  Primary Cardiologist: Mertie Moores, MD   Subjective   Denies any chest pain or SOB.  Remains in NSR but lethargic  Inpatient Medications    Scheduled Meds: . aspirin EC  81 mg Oral Daily  . Chlorhexidine Gluconate Cloth  6 each Topical Q0600  . feeding supplement (PRO-STAT SUGAR FREE 64)  30 mL Per Tube BID  . heparin injection (subcutaneous)  5,000 Units Subcutaneous Q8H  . insulin aspart  0-15 Units Subcutaneous Q4H  . sodium chloride flush  10-40 mL Intracatheter Q12H   Continuous Infusions: . sodium chloride    . sodium chloride    . sodium chloride    . sodium chloride    . amiodarone 30 mg/hr (10/31/19 2505)  . feeding supplement (OSMOLITE 1.5 CAL) Stopped (10/29/19 0001)   PRN Meds: sodium chloride, sodium chloride, sodium chloride, sodium chloride, acetaminophen **OR** acetaminophen, alteplase, famotidine, heparin, labetalol, lidocaine (PF), lidocaine-prilocaine, mirtazapine, ondansetron **OR** ondansetron (ZOFRAN) IV, pentafluoroprop-tetrafluoroeth, sodium chloride flush   Vital Signs    Vitals:   10/31/19 0720 10/31/19 0756 10/31/19 0807 10/31/19 0825  BP: (!) 86/52 (!) 104/46  (!) 114/49  Pulse:  71 84 91  Resp: 19  (!) 23 (!) 24  Temp:  98.2 F (36.8 C)    TempSrc:  Oral    SpO2:   100% 100%  Weight:      Height:        Intake/Output Summary (Last 24 hours) at 10/31/2019 0836 Last data filed at 10/31/2019 0441 Gross per 24 hour  Intake 118 ml  Output 200 ml  Net -82 ml   Last 3 Weights 10/31/2019 10/30/2019 10/30/2019  Weight (lbs) 236 lb 15.9 oz 235 lb 14.3 oz 235 lb 3.7 oz  Weight (kg) 107.5 kg 107 kg 106.7 kg      Telemetry    NSR -personally reviewed  ECG    No new EKG to review  Physical Exam   GEN: Well nourished, well developed in no acute distress HEENT: Normal NECK: No JVD; No carotid bruits LYMPHATICS: No lymphadenopathy CARDIAC:irregular, no murmurs,  rubs, gallops RESPIRATORY:  Clear to auscultation without rales, wheezing or rhonchi  ABDOMEN: Soft, non-tender, non-distended MUSCULOSKELETAL:  No edema; No deformity  SKIN: Warm and dry NEUROLOGIC:  Cannot assess due to lethargy PSYCHIATRIC:  Cannot assess - confused   Labs    High Sensitivity Troponin:  No results for input(s): TROPONINIHS in the last 720 hours.    Chemistry Recent Labs  Lab 10/29/19 0724 10/30/19 0601 10/31/19 0356  NA 141 141 141  K 5.1 4.4 5.5*  CL 102 105 103  CO2 24 26 23   GLUCOSE 308* 147* 94  BUN 104* 72* 95*  CREATININE 5.66* 4.33* 5.63*  CALCIUM 7.2* 7.2* 7.2*  ALBUMIN 2.6* 2.3* 2.4*  GFRNONAA 8* 12* 9*  GFRAA 10* 14* 10*  ANIONGAP 15 10 15      Hematology Recent Labs  Lab 10/29/19 0905 10/29/19 1528 10/29/19 2342 10/30/19 0601 10/31/19 0356  WBC 169.3*  --   --  174.0* 183.2*  RBC 2.25*  --   --  2.37* 2.35*  HGB 7.1*   < > 7.7* 7.3* 7.0*  HCT 23.2*   < > 24.7* 23.3* 23.5*  MCV 103.1*  --   --  98.3 100.0  MCH 31.6  --   --  30.8 29.8  MCHC 30.6  --   --  31.3 29.8*  RDW 19.0*  --   --  20.0* 20.1*  PLT 74*  --   --  65* 71*   < > = values in this interval not displayed.    BNPNo results for input(s): BNP, PROBNP in the last 168 hours.   DDimer No results for input(s): DDIMER in the last 168 hours.   Radiology    No results found.  Cardiac Studies   Echocardiogram 10/26/2019: Impressions: 1. Left ventricular ejection fraction, by estimation, is 30 to 35%. The  left ventricle has moderately decreased function. The left ventricle  demonstrates regional wall motion abnormalities (see scoring  diagram/findings for description). The left  ventricular internal cavity size was moderately dilated. Left ventricular  diastolic parameters are indeterminate. There is severe hypokinesis to  akinesis of all apical segments, to the mid wall level. Function at the  basal segments is only mildly  hypokinetic. This may be consistent  with a stress induced (takotsubo)  cardiomyopathy, though multivessel CAD cannot be excluded. LV thrombus  excluded by contrast.  2. Right ventricular systolic function is mildly reduced. The right  ventricular size is mildly enlarged. There is mildly elevated pulmonary  artery systolic pressure.  3. Left atrial size was moderately dilated.  4. Right atrial size was moderately dilated.  5. The mitral valve is grossly normal. Mild mitral valve regurgitation.  No evidence of mitral stenosis.  6. The aortic valve has an indeterminant number of cusps. Aortic valve  regurgitation is trivial. Mild to moderate aortic valve  sclerosis/calcification is present, without any evidence of aortic  stenosis.   Comparison(s): No prior Echocardiogram.   Patient Profile   Mr. Benjamin Watkins is a 86 y.o. male with a history of hypertension, diabetes mellitus, chronic anemia, and CKD who is being seen today for the evaluation of new onset atrial fibrillation at the request of Dr. Doristine Bosworth.  Assessment & Plan    New Onset Paroxysmal Atrial Fibrillation  -Heparin was stopped as he became anemic requiring blood transfusion.   -remains in NSR  -change Amio IV to PO 400mg  BID today - nurse confirmed that he has been able to take pills  Acute on Chronic Combined CHF -Ejection fraction 30 to 35% in a pattern that could be stress-induced cardiomyopathy though coronary artery disease cannot be ruled out.   -He will need optimal medical therapy pending kidney recovery. -BP currently too soft to add BB or Hydralazine and AKI precludes addition of ARB/ACE I -creatinine 5.63 today  CLL -Being managed by oncology.   -WBC elevated today at 183K (174>>183) -Hbg remains low at 7 -IIf no signs of bleeding, may want to restart anticoagulation if he goes back into atrial fibrillation.   Chronic Anemia -Chronic.   -Likely due to CLL and potentially kidney disease.  Type 2 Diabetes Mellitus -Management per primary  team  Acute on Chronic Kidney Disease -Started on dialysis this admission. -Creatinine today 6.57  Acute Metabolic Encephalopathy -Per primary team  I have spent a total of 30 minutes with patient reviewing hospital notes , telemetry, EKGs, labs and examining patient as well as establishing an assessment and plan that was discussed with the patient.  > 50% of time was spent in direct patient care.     For questions or updates, please contact [place] Please consult [email or web address] for contact info under        Signed, Fransico Him, MD  10/31/2019, 8:36 AM

## 2019-10-31 NOTE — Consult Note (Signed)
[place]  [phone] [phone]   MEDICAL ONCOLOGY - CONSULTATION  Referral MD: Dr. Domenic Polite  Reason for Consultation: CLL, goals of care  HPI: Benjamin Watkins is an 86 year old male with a past medical history significant for CLL, hypertension, diabetes.  He was last seen at the cancer center on 3/23/~21 and was referred to the emergency room due to abnormal labs, specifically BUN of 69 and creatinine of 4.58.  Additionally, labs on admission showed a WBC of 18.5, hemoglobin 8.1, and platelet count of 79,000.  Hospital records of been reviewed.  He has required dialysis on 3 occasions so far.  He continues to have poor urine output and BUN/creatinine remain elevated at 95 and 5.63 respectively.  His WBC is elevated to 183.2, hemoglobin remains low at 7.0, and platelet count remains low at 71,000.  Venetoclax has been on hold this admission. He has developed a fib with RVR this admission. Cardiology following. Had an episode of hypotension last evening with SBP in the 70's - received NS bolus and X2 and midodrine 5 mg.   When seen today, the patient is able to open his eyes and talk to me.  However, he quickly falls back to sleep.  His wife and son are at the bedside.  They state that he is less confused today.  The patient denies having any pain.  Appetite is fair.  No recent fever or chills.  No bleeding noted.  He denies dizziness and headaches.  Denies chest pain and shortness of breath.  Denies abdominal pain, nausea, vomiting.  Foley catheter in place.  Medical oncology was asked see the patient to make recommendations regarding his CLL and for discussion of prognosis related to his CLL.   Past Medical History:  Diagnosis Date  . CLL (chronic lymphocytic leukemia) ([place])   . Diabetes mellitus without complication ([place])   . Diverticulosis   . History of colon polyps   . Hypertension   . Low back pain   . Right foot drop   :  Past Surgical  History:  Procedure Laterality Date  . CHOLECYSTECTOMY     laparoscopic  . COLONOSCOPY WITH PROPOFOL N/A 12/10/2015   Procedure: COLONOSCOPY WITH PROPOFOL;  Surgeon: Garlan Fair, MD;  Location: [facility]COPY;  Service: Endoscopy;  Laterality: N/A;  . EYE SURGERY Bilateral    bleeding retina  . IR FLUORO GUIDE CV LINE RIGHT  10/25/2019  . IR FLUORO GUIDE CV LINE RIGHT  10/26/2019  . IR US GUIDE VASC ACCESS RIGHT  10/25/2019  . IR US GUIDE VASC ACCESS RIGHT  10/26/2019  . TONSILLECTOMY    :  Current Facility-Administered Medications  Medication Dose Route Frequency Provider Last Rate Last Admin  . 0.9 %  sodium chloride infusion (Manually program via Guardrails IV Fluids)   Intravenous Once Domenic Polite, MD      . 0.9 %  sodium chloride infusion  100 mL Intravenous PRN Edrick Oh, MD      . 0.9 %  sodium chloride infusion  100 mL Intravenous PRN Edrick Oh, MD      . 0.9 %  sodium chloride infusion  100 mL Intravenous PRN Edrick Oh, MD      . 0.9 %  sodium chloride infusion  100 mL Intravenous PRN Edrick Oh, MD      . acetaminophen (TYLENOL) tablet 650 mg  650 mg Oral Q6H PRN Donne Hazel, MD       Or  .  acetaminophen (TYLENOL) suppository 650 mg  650 mg Rectal Q6H PRN Donne Hazel, MD      . alteplase (CATHFLO ACTIVASE) injection 2 mg  2 mg Intracatheter Once PRN Edrick Oh, MD      . amiodarone (PACERONE) tablet 200 mg  200 mg Oral BID Sueanne Margarita, MD   200 mg at 10/31/19 0915  . aspirin EC tablet 81 mg  81 mg Oral Daily Donne Hazel, MD   81 mg at 10/31/19 0915  . Chlorhexidine Gluconate Cloth 2 % PADS 6 each  6 each Topical Q0600 Edrick Oh, MD   6 each at 10/31/19 [phone]  . famotidine (PEPCID) tablet 20 mg  20 mg Per Tube Daily PRN Donnamae Jude, RPH   20 mg at 10/28/19 2040  . feeding supplement (OSMOLITE 1.5 CAL) liquid 1,000 mL  1,000 mL Per Tube Continuous Darliss Cheney, MD   Stopped at 10/29/19 0001  . feeding supplement (PRO-STAT SUGAR FREE 64)  liquid 30 mL  30 mL Per Tube BID Darliss Cheney, MD   30 mL at 10/30/19 1222  . heparin injection 1,000 Units  1,000 Units Dialysis PRN Edrick Oh, MD      . heparin injection 5,000 Units  5,000 Units Subcutaneous Q8H Domenic Polite, MD   5,000 Units at 10/31/19 [phone]  . insulin aspart (novoLOG) injection 0-15 Units  0-15 Units Subcutaneous Q4H Kirby-Graham, Karsten Fells, NP   2 Units at 10/31/19 0000  . labetalol (NORMODYNE) injection 5 mg  5 mg Intravenous Q2H PRN Donne Hazel, MD   5 mg at 10/25/19 1810  . lidocaine (PF) (XYLOCAINE) 1 % injection 5 mL  5 mL Intradermal PRN Edrick Oh, MD      . lidocaine-prilocaine (EMLA) cream 1 application  1 application Topical PRN Edrick Oh, MD      . midodrine (PROAMATINE) tablet 10 mg  10 mg Oral BID WC Domenic Polite, MD   10 mg at 10/31/19 1014  . mirtazapine (REMERON) tablet 15 mg  15 mg Oral QHS PRN Donne Hazel, MD      . ondansetron [facility]) tablet 4 mg  4 mg Oral Q6H PRN Donne Hazel, MD       Or  . ondansetron [facility]) injection 4 mg  4 mg Intravenous Q6H PRN Donne Hazel, MD      . pentafluoroprop-tetrafluoroeth Landry Dyke) aerosol 1 application  1 application Topical PRN Edrick Oh, MD      . sodium chloride flush (NS) 0.9 % injection 10-40 mL  10-40 mL Intracatheter Q12H Donne Hazel, MD   10 mL at 10/29/19 2047  . sodium chloride flush (NS) 0.9 % injection 10-40 mL  10-40 mL Intracatheter PRN Donne Hazel, MD   10 mL at 10/29/19 1113     Allergies  Allergen Reactions  . Hydrochlorothiazide Other (See Comments)    "It just bothers me"  . Lisinopril Other (See Comments)    "Takes away energy"  :  Family History  Problem Relation Age of Onset  . Stroke Mother   . Diabetes Mother   . Other Father        "old age"  :  Social History   Socioeconomic History  . Marital status: Married    Spouse name: Not on file  . Number of children: 1  . Years of education: some college  . Highest education level: Not on  file  Occupational History  . Occupation: Retired  Tobacco Use  .  Smoking status: Never Smoker  . Smokeless tobacco: Never Used  Substance and Sexual Activity  . Alcohol use: No  . Drug use: No  . Sexual activity: Not on file  Other Topics Concern  . Not on file  Social History Narrative   Lives at home with his wife.    Right-handed.    0.5 cup of coffee each morning, some tea.   Social Determinants of Health   Financial Resource Strain:   . Difficulty of Paying Living Expenses:   Food Insecurity:   . Worried About Charity fundraiser in the Last Year:   . Arboriculturist in the Last Year:   Transportation Needs:   . Film/video editor (Medical):   Marland Kitchen Lack of Transportation (Non-Medical):   Physical Activity:   . Days of Exercise per Week:   . Minutes of Exercise per Session:   Stress:   . Feeling of Stress :   Social Connections:   . Frequency of Communication with Friends and Family:   . Frequency of Social Gatherings with Friends and Family:   . Attends Religious Services:   . Active Member of Clubs or Organizations:   . Attends Archivist Meetings:   Marland Kitchen Marital Status:   Intimate Partner Violence:   . Fear of Current or Ex-Partner:   . Emotionally Abused:   Marland Kitchen Physically Abused:   . Sexually Abused:   :  Review of Systems: A comprehensive 14 point review of systems was negative except as noted in the HPI.  Exam: Patient Vitals for the past 24 hrs:  BP Temp Temp src Pulse Resp SpO2 Weight  10/31/19 0825 (!) 114/49 -- -- 91 (!) 24 100 % --  10/31/19 0807 -- -- -- 84 (!) 23 100 % --  10/31/19 0756 (!) 104/46 98.2 F (36.8 C) Oral 71 -- -- --  10/31/19 0720 (!) 86/52 -- -- -- 19 -- --  10/31/19 0412 (!) 97/51 98.2 F (36.8 C) Oral 66 (!) 23 95 % 107.5 kg  10/30/19 2310 (!) 103/48 -- -- (!) 58 18 100 % --  10/30/19 2300 (!) 90/52 98.2 F (36.8 C) Oral 62 19 -- --  10/30/19 2220 (!) 91/46 -- -- -- 19 -- --  10/30/19 2140 (!) 91/47 -- -- (!) 58  (!) 26 100 % --  10/30/19 2109 (!) 89/48 -- -- (!) 58 (!) 23 100 % --  10/30/19 2105 (!) 84/34 -- -- (!) 58 (!) 33 100 % --  10/30/19 2047 (!) 84/38 -- -- (!) 58 (!) 21 100 % --  10/30/19 1928 90/78 98.2 F (36.8 C) Oral 82 20 -- --  10/30/19 1850 (!) 90/51 -- -- 88 (!) 21 99 % --  10/30/19 1718 -- -- -- -- (!) 21 -- --  10/30/19 1637 (!) 94/47 -- -- (!) 59 20 100 % --  10/30/19 1622 (!) 92/50 (!) 97.5 F (36.4 C) Oral 61 (!) 23 99 % --  10/30/19 1149 (!) 104/42 98.2 F (36.8 C) Oral 99 19 97 % --    General: Chronically ill-appearing male, no distress Eyes:  no scleral icterus.   ENT:  There were no oropharyngeal lesions.   Neck was without thyromegaly.   Lymphatics:  Negative cervical, supraclavicular or axillary adenopathy.   Respiratory: lungs were clear bilaterally without wheezing or crackles.   Cardiovascular: Irregular, no murmurs, trace edema in the bilateral upper and lower extremities GI:  abdomen was soft, flat, nontender,  nondistended, without organomegaly.    Skin: Scattered ecchymoses with chronic venous stasis changes in the lower extremities Neuro: Alert and able to answer questions, but falls asleep quickly.  Oriented to person and place.  Lab Results  Component Value Date   WBC 183.2 (HH) 10/31/2019   HGB 7.0 (L) 10/31/2019   HCT 23.5 (L) 10/31/2019   PLT 71 (L) 10/31/2019   GLUCOSE 94 10/31/2019   ALT 13 10/18/2019   AST 13 (L) 10/18/2019   NA 141 10/31/2019   K 5.5 (H) 10/31/2019   CL 103 10/31/2019   CREATININE 5.63 (H) 10/31/2019   BUN 95 (H) 10/31/2019   CO2 23 10/31/2019    US RENAL  Result Date: 10/20/2019 CLINICAL DATA:  Acute renal failure. EXAM: RENAL / URINARY TRACT ULTRASOUND COMPLETE COMPARISON:  None. FINDINGS: Right Kidney: Renal measurements: 12.5 x 5.8 x 7.1 cm = volume: 272 mL . Echogenicity within normal limits. No mass or hydronephrosis visualized. 1.3 cm cyst over the upper pole. Left Kidney: Renal measurements: 0.5 x 5.9 x 6.5 cm =  volume: 253 mL. Echogenicity within normal limits. No mass or hydronephrosis visualized. Bladder: Appears normal for degree of bladder distention. Bilateral ureteral jets visualized. Other: Mild prominence of the prostate gland measuring 5.8 x 5.0 x 3.6 cm = volume 54.8 cubic cm. IMPRESSION: 1.  Normal size kidneys without hydronephrosis. 2.  1.3 cm right renal cyst. 3.  Mild prostatic enlargement. Electronically Signed   By: Marin Olp M.D.   On: 10/20/2019 15:37   IR Fluoro Guide CV Line Right  Result Date: 10/26/2019 CLINICAL DATA:  Renal failure and status post placement of tunneled hemodialysis catheter yesterday with subsequent pulling and complete dislodgement of the catheter by the patient due to confusion and combativeness. He requires another catheter to be placed for emergent hemodialysis. EXAM: TUNNELED CENTRAL VENOUS HEMODIALYSIS CATHETER PLACEMENT WITH ULTRASOUND AND FLUOROSCOPIC GUIDANCE ANESTHESIA/SEDATION: 0.5 mg IV Versed; 25 mcg IV Fentanyl. Total Moderate Sedation Time:   19 minutes. The patient's level of consciousness and physiologic status were continuously monitored during the procedure by Radiology nursing. MEDICATIONS: 2 g IV Ancef. FLUOROSCOPY TIME:  24 seconds.  3.2 mGy. PROCEDURE: The procedure, risks, benefits, and alternatives were explained to the patient. Questions regarding the procedure were encouraged and answered. The patient understands and consents to the procedure. A timeout was performed prior to initiating the procedure. The right neck and chest were prepped with chlorhexidine in a sterile fashion, and a sterile drape was applied covering the operative field. Maximum barrier sterile technique with sterile gowns and gloves were used for the procedure. Local anesthesia was provided with 1% lidocaine. Ultrasound was used to confirm patency of the right internal jugular vein. After creating a small venotomy incision, a 21 gauge needle was advanced into the right internal  jugular vein under direct, real-time ultrasound guidance. Ultrasound image documentation was performed. After securing guidewire access, an 8 Fr dilator was placed. A J-wire was kinked to measure appropriate catheter length. A Palindrome tunneled hemodialysis catheter measuring 19 cm from tip to cuff was chosen for placement. This was tunneled in a retrograde fashion from the chest wall to the venotomy incision. At the venotomy, serial dilatation was performed and a 16 Fr peel-away sheath was placed over a guidewire. The catheter was then placed through the sheath and the sheath removed. Final catheter positioning was confirmed and documented with a fluoroscopic spot image. The catheter was aspirated, flushed with saline, and injected with appropriate volume heparin  dwells. The venotomy incision was closed with subcutaneous subcuticular 4-0 Vicryl. Dermabond was applied to the incision. The catheter exit site was secured with 0-Prolene retention sutures. COMPLICATIONS: None.  No pneumothorax. FINDINGS: After catheter placement, the tip lies in the right atrium. The catheter aspirates normally and is ready for immediate use. IMPRESSION: Placement of tunneled hemodialysis catheter via the right internal jugular vein. The catheter tip lies in the right atrium. The catheter is ready for immediate use. Electronically Signed   By: Aletta Edouard M.D.   On: 10/26/2019 11:14   IR Fluoro Guide CV Line Right  Result Date: 10/25/2019 INDICATION: 86 year old male in need secondary to acute kidney injury. to acute kidney injury. 86 year old male in need of hemodialysis of hemodialysis secondary EXAM: TUNNELED CENTRAL VENOUS HEMODIALYSIS CATHETER PLACEMENT WITH ULTRASOUND AND FLUOROSCOPIC GUIDANCE MEDICATIONS: 2 g Ancef 2 g Ancef. The antibiotic was given in an appropriate time interval prior to skin puncture. ANESTHESIA/SEDATION: Moderate (conscious) sedation was employed during this procedure. A total of Versed 1 mg and  Fentanyl 25 mcg was administered intravenously. Moderate Sedation Time: 14 minutes. The patient's level of consciousness and vital signs were monitored continuously by radiology nursing throughout the procedure under my direct supervision. FLUOROSCOPY TIME:  Fluoroscopy Time: 0 minutes 36 seconds (3 mGy). COMPLICATIONS: None immediate. PROCEDURE: Informed written consent was obtained from the patient after a discussion of the risks, benefits, and alternatives to treatment. Questions regarding the procedure were encouraged and answered. The right neck and chest were prepped with chlorhexidine in a sterile fashion, and a sterile drape was applied covering the operative field. Maximum barrier sterile technique with sterile gowns and gloves were used for the procedure. A timeout was performed prior to the initiation of the procedure. After creating a small venotomy incision, a micropuncture kit was utilized to access the right internal jugular vein under direct, real-time ultrasound guidance after the overlying soft tissues were anesthetized with 1% lidocaine with epinephrine. Ultrasound image documentation was performed. The microwire was kinked to measure appropriate catheter length. A stiff Glidewire was advanced to the level of the IVC and the micropuncture sheath was exchanged for a peel-away sheath. A palindrome tunneled hemodialysis catheter measuring 19 cm from tip to cuff was tunneled in a retrograde fashion from the anterior chest wall to the venotomy incision. The catheter was then placed through the peel-away sheath with tips ultimately positioned within the superior aspect of the right atrium. Final catheter positioning was confirmed and documented with a spot radiographic image. The catheter aspirates and flushes normally. The catheter was flushed with appropriate volume heparin dwells. The catheter exit site was secured with a 0-Prolene retention suture. The venotomy incision was closed with Dermabond.  Dressings were applied. The patient tolerated the procedure well without immediate post procedural complication. IMPRESSION: Successful placement of 19 cm tip to cuff tunneled hemodialysis catheter via the right internal jugular vein with tips terminating within the superior aspect of the right atrium. The catheter is ready for immediate use. Electronically Signed   By: Jacqulynn Cadet M.D.   On: 10/25/2019 20:15   IR US Guide Vasc Access Right  Result Date: 10/26/2019 CLINICAL DATA:  Renal failure and status post placement of tunneled hemodialysis catheter yesterday with subsequent pulling and complete dislodgement of the catheter by the patient due to confusion and combativeness. He requires another catheter to be placed for emergent hemodialysis. EXAM: TUNNELED CENTRAL VENOUS HEMODIALYSIS CATHETER PLACEMENT WITH ULTRASOUND AND FLUOROSCOPIC GUIDANCE ANESTHESIA/SEDATION: 0.5 mg IV Versed; 25 mcg  IV Fentanyl. Total Moderate Sedation Time:   19 minutes. The patient's level of consciousness and physiologic status were continuously monitored during the procedure by Radiology nursing. MEDICATIONS: 2 g IV Ancef. FLUOROSCOPY TIME:  24 seconds.  3.2 mGy. PROCEDURE: The procedure, risks, benefits, and alternatives were explained to the patient. Questions regarding the procedure were encouraged and answered. The patient understands and consents to the procedure. A timeout was performed prior to initiating the procedure. The right neck and chest were prepped with chlorhexidine in a sterile fashion, and a sterile drape was applied covering the operative field. Maximum barrier sterile technique with sterile gowns and gloves were used for the procedure. Local anesthesia was provided with 1% lidocaine. Ultrasound was used to confirm patency of the right internal jugular vein. After creating a small venotomy incision, a 21 gauge needle was advanced into the right internal jugular vein under direct, real-time ultrasound  guidance. Ultrasound image documentation was performed. After securing guidewire access, an 8 Fr dilator was placed. A J-wire was kinked to measure appropriate catheter length. A Palindrome tunneled hemodialysis catheter measuring 19 cm from tip to cuff was chosen for placement. This was tunneled in a retrograde fashion from the chest wall to the venotomy incision. At the venotomy, serial dilatation was performed and a 16 Fr peel-away sheath was placed over a guidewire. The catheter was then placed through the sheath and the sheath removed. Final catheter positioning was confirmed and documented with a fluoroscopic spot image. The catheter was aspirated, flushed with saline, and injected with appropriate volume heparin dwells. The venotomy incision was closed with subcutaneous subcuticular 4-0 Vicryl. Dermabond was applied to the incision. The catheter exit site was secured with 0-Prolene retention sutures. COMPLICATIONS: None.  No pneumothorax. FINDINGS: After catheter placement, the tip lies in the right atrium. The catheter aspirates normally and is ready for immediate use. IMPRESSION: Placement of tunneled hemodialysis catheter via the right internal jugular vein. The catheter tip lies in the right atrium. The catheter is ready for immediate use. Electronically Signed   By: Aletta Edouard M.D.   On: 10/26/2019 11:14   IR US Guide Vasc Access Right  Result Date: 10/25/2019 INDICATION: 86 year old male in need secondary to acute kidney injury. to acute kidney injury. 85 year old male in need of hemodialysis of hemodialysis secondary EXAM: TUNNELED CENTRAL VENOUS HEMODIALYSIS CATHETER PLACEMENT WITH ULTRASOUND AND FLUOROSCOPIC GUIDANCE MEDICATIONS: 2 g Ancef 2 g Ancef. The antibiotic was given in an appropriate time interval prior to skin puncture. ANESTHESIA/SEDATION: Moderate (conscious) sedation was employed during this procedure. A total of Versed 1 mg and Fentanyl 25 mcg was administered intravenously.  Moderate Sedation Time: 14 minutes. The patient's level of consciousness and vital signs were monitored continuously by radiology nursing throughout the procedure under my direct supervision. FLUOROSCOPY TIME:  Fluoroscopy Time: 0 minutes 36 seconds (3 mGy). COMPLICATIONS: None immediate. PROCEDURE: Informed written consent was obtained from the patient after a discussion of the risks, benefits, and alternatives to treatment. Questions regarding the procedure were encouraged and answered. The right neck and chest were prepped with chlorhexidine in a sterile fashion, and a sterile drape was applied covering the operative field. Maximum barrier sterile technique with sterile gowns and gloves were used for the procedure. A timeout was performed prior to the initiation of the procedure. After creating a small venotomy incision, a micropuncture kit was utilized to access the right internal jugular vein under direct, real-time ultrasound guidance after the overlying soft tissues were anesthetized with 1% lidocaine  with epinephrine. Ultrasound image documentation was performed. The microwire was kinked to measure appropriate catheter length. A stiff Glidewire was advanced to the level of the IVC and the micropuncture sheath was exchanged for a peel-away sheath. A palindrome tunneled hemodialysis catheter measuring 19 cm from tip to cuff was tunneled in a retrograde fashion from the anterior chest wall to the venotomy incision. The catheter was then placed through the peel-away sheath with tips ultimately positioned within the superior aspect of the right atrium. Final catheter positioning was confirmed and documented with a spot radiographic image. The catheter aspirates and flushes normally. The catheter was flushed with appropriate volume heparin dwells. The catheter exit site was secured with a 0-Prolene retention suture. The venotomy incision was closed with Dermabond. Dressings were applied. The patient tolerated the  procedure well without immediate post procedural complication. IMPRESSION: Successful placement of 19 cm tip to cuff tunneled hemodialysis catheter via the right internal jugular vein with tips terminating within the superior aspect of the right atrium. The catheter is ready for immediate use. Electronically Signed   By: Jacqulynn Cadet M.D.   On: 10/25/2019 20:15   DG CHEST PORT 1 VIEW  Result Date: 10/25/2019 CLINICAL DATA:  Placement of hemodialysis catheter with subsequent removal EXAM: PORTABLE CHEST 1 VIEW COMPARISON:  05/22/2017, 10/25/2019 FINDINGS: No central venous catheter identified on the current image. Enlarged cardiomediastinal silhouette with vascular congestion and bilateral interstitial and ground-glass opacity suspect for edema. Probable left pleural effusion. Aortic atherosclerosis. No pneumothorax. IMPRESSION: Mild cardiomegaly with vascular congestion, and diffuse interstitial and ground-glass opacity, likely pulmonary edema with probable left pleural effusion Electronically Signed   By: Donavan Foil M.D.   On: 10/25/2019 19:18   ECHOCARDIOGRAM COMPLETE  Result Date: 10/26/2019    ECHOCARDIOGRAM REPORT   Patient Name:   Benjamin Watkins Date of Exam: 10/26/2019 Medical Rec #:  [id number]         Height:       74.0 in Accession #:    [id number]        Weight:       222.2 lb Date of Birth:  October 31, 1932         BSA:          2.273 m Patient Age:    79 years          BP:           98/53 mmHg Patient Gender: M                 HR:           99 bpm. Exam Location:  Inpatient Procedure: 2D Echo, Color Doppler, Cardiac Doppler and Intracardiac            Opacification Agent Indications:    I48.91* Unspecified atrial fibrillation  History:        Patient has no prior history of Echocardiogram examinations.                 Risk Factors:Hypertension and Diabetes.  Sonographer:    Raquel Sarna Senior RDCS Referring Phys: [place]  Sonographer Comments: Technically difficult due to poor echo  windows. IMPRESSIONS  1. Left ventricular ejection fraction, by estimation, is 30 to 35%. The left ventricle has moderately decreased function. The left ventricle demonstrates regional wall motion abnormalities (see scoring diagram/findings for description). The left ventricular internal cavity size was moderately dilated. Left ventricular diastolic parameters are indeterminate. There is severe hypokinesis to akinesis of all  apical segments, to the mid wall level. Function at the basal segments is only mildly hypokinetic. This may be consistent with a stress induced (takotsubo) cardiomyopathy, though multivessel CAD cannot be excluded. LV thrombus excluded by contrast.  2. Right ventricular systolic function is mildly reduced. The right ventricular size is mildly enlarged. There is mildly elevated pulmonary artery systolic pressure.  3. Left atrial size was moderately dilated.  4. Right atrial size was moderately dilated.  5. The mitral valve is grossly normal. Mild mitral valve regurgitation. No evidence of mitral stenosis.  6. The aortic valve has an indeterminant number of cusps. Aortic valve regurgitation is trivial. Mild to moderate aortic valve sclerosis/calcification is present, without any evidence of aortic stenosis. Comparison(s): No prior Echocardiogram. FINDINGS  Left Ventricle: Left ventricular ejection fraction, by estimation, is 30 to 35%. The left ventricle has moderately decreased function. The left ventricle demonstrates regional wall motion abnormalities. Definity contrast agent was given IV to delineate the left ventricular endocardial borders. The left ventricular internal cavity size was moderately dilated. There is no left ventricular hypertrophy. Left ventricular diastolic parameters are indeterminate.  LV Wall Scoring: The mid and distal anterior wall, mid and distal lateral wall, mid and distal anterior septum, entire apex, mid and distal inferior wall, mid anterolateral segment, and mid  inferoseptal segment are akinetic. The basal anteroseptal segment, basal inferolateral segment, basal anterolateral segment, basal anterior segment, basal inferior segment, and basal inferoseptal segment are hypokinetic. There is severe hypokinesis to akinesis of all apical segments, to the mid wall level. Function at the basal segments is only mildly hypokinetic. This may be consistent with a stress induced (takotsubo) cardiomyopathy, though multivessel CAD cannot be excluded. LV thrombus excluded by contrast. Right Ventricle: The right ventricular size is mildly enlarged. Right vetricular wall thickness was not assessed. Right ventricular systolic function is mildly reduced. There is mildly elevated pulmonary artery systolic pressure. The tricuspid regurgitant velocity is 2.57 m/s, and with an assumed right atrial pressure of 8 mmHg, the estimated right ventricular systolic pressure is 56.3 mmHg. Left Atrium: Left atrial size was moderately dilated. Right Atrium: Right atrial size was moderately dilated. Pericardium: Trivial pericardial effusion is present. Mitral Valve: The mitral valve is grossly normal. There is mild thickening of the mitral valve leaflet(s). There is mild calcification of the mitral valve leaflet(s). Mild mitral annular calcification. Mild mitral valve regurgitation. No evidence of mitral valve stenosis. Tricuspid Valve: The tricuspid valve is normal in structure. Tricuspid valve regurgitation is mild . No evidence of tricuspid stenosis. Aortic Valve: The aortic valve has an indeterminant number of cusps. . There is moderate thickening and moderate calcification of the aortic valve. Aortic valve regurgitation is trivial. Mild to moderate aortic valve sclerosis/calcification is present, without any evidence of aortic stenosis. There is moderate thickening of the aortic valve. There is moderate calcification of the aortic valve. Pulmonic Valve: The pulmonic valve was not well visualized.  Pulmonic valve regurgitation is not visualized. No evidence of pulmonic stenosis. Aorta: The aortic root and ascending aorta are structurally normal, with no evidence of dilitation. Pulmonary Artery: The pulmonary artery is not well seen. Venous: The inferior vena cava was not well visualized. IAS/Shunts: No atrial level shunt detected by color flow Doppler.  LEFT VENTRICLE PLAX 2D LVIDd:         5.40 cm  Diastology LVIDs:         4.30 cm  LV e' lateral: 7.18 cm/s LV PW:  1.10 cm  LV e' medial:  2.50 cm/s LV IVS:        1.10 cm LVOT diam:     2.10 cm LV SV:         53 LV SV Index:   23 LVOT Area:     3.46 cm  RIGHT VENTRICLE RV S prime:     7.62 cm/s TAPSE (M-mode): 1.6 cm LEFT ATRIUM              Index       RIGHT ATRIUM           Index LA diam:        3.60 cm  1.58 cm/m  RA Area:     28.10 cm LA Vol (A2C):   94.2 ml  41.44 ml/m RA Volume:   95.30 ml  41.93 ml/m LA Vol (A4C):   102.0 ml 44.87 ml/m LA Biplane Vol: 102.0 ml 44.87 ml/m  AORTIC VALVE LVOT Vmax:   77.40 cm/s LVOT Vmean:  56.700 cm/s LVOT VTI:    0.153 m  AORTA Ao Root diam: 3.60 cm TRICUSPID VALVE TR Peak grad:   26.4 mmHg TR Vmax:        257.00 cm/s  SHUNTS Systemic VTI:  0.15 m Systemic Diam: 2.10 cm Buford Dresser MD Electronically signed by Buford Dresser MD Signature Date/Time: 10/26/2019/3:28:10 PM    Final      US RENAL  Result Date: 10/20/2019 CLINICAL DATA:  Acute renal failure. EXAM: RENAL / URINARY TRACT ULTRASOUND COMPLETE COMPARISON:  None. FINDINGS: Right Kidney: Renal measurements: 12.5 x 5.8 x 7.1 cm = volume: 272 mL . Echogenicity within normal limits. No mass or hydronephrosis visualized. 1.3 cm cyst over the upper pole. Left Kidney: Renal measurements: 0.5 x 5.9 x 6.5 cm = volume: 253 mL. Echogenicity within normal limits. No mass or hydronephrosis visualized. Bladder: Appears normal for degree of bladder distention. Bilateral ureteral jets visualized. Other: Mild prominence of the prostate gland  measuring 5.8 x 5.0 x 3.6 cm = volume 54.8 cubic cm. IMPRESSION: 1.  Normal size kidneys without hydronephrosis. 2.  1.3 cm right renal cyst. 3.  Mild prostatic enlargement. Electronically Signed   By: Marin Olp M.D.   On: 10/20/2019 15:37   IR Fluoro Guide CV Line Right  Result Date: 10/26/2019 CLINICAL DATA:  Renal failure and status post placement of tunneled hemodialysis catheter yesterday with subsequent pulling and complete dislodgement of the catheter by the patient due to confusion and combativeness. He requires another catheter to be placed for emergent hemodialysis. EXAM: TUNNELED CENTRAL VENOUS HEMODIALYSIS CATHETER PLACEMENT WITH ULTRASOUND AND FLUOROSCOPIC GUIDANCE ANESTHESIA/SEDATION: 0.5 mg IV Versed; 25 mcg IV Fentanyl. Total Moderate Sedation Time:   19 minutes. The patient's level of consciousness and physiologic status were continuously monitored during the procedure by Radiology nursing. MEDICATIONS: 2 g IV Ancef. FLUOROSCOPY TIME:  24 seconds.  3.2 mGy. PROCEDURE: The procedure, risks, benefits, and alternatives were explained to the patient. Questions regarding the procedure were encouraged and answered. The patient understands and consents to the procedure. A timeout was performed prior to initiating the procedure. The right neck and chest were prepped with chlorhexidine in a sterile fashion, and a sterile drape was applied covering the operative field. Maximum barrier sterile technique with sterile gowns and gloves were used for the procedure. Local anesthesia was provided with 1% lidocaine. Ultrasound was used to confirm patency of the right internal jugular vein. After creating a small venotomy incision, a 21 gauge  needle was advanced into the right internal jugular vein under direct, real-time ultrasound guidance. Ultrasound image documentation was performed. After securing guidewire access, an 8 Fr dilator was placed. A J-wire was kinked to measure appropriate catheter length. A  Palindrome tunneled hemodialysis catheter measuring 19 cm from tip to cuff was chosen for placement. This was tunneled in a retrograde fashion from the chest wall to the venotomy incision. At the venotomy, serial dilatation was performed and a 16 Fr peel-away sheath was placed over a guidewire. The catheter was then placed through the sheath and the sheath removed. Final catheter positioning was confirmed and documented with a fluoroscopic spot image. The catheter was aspirated, flushed with saline, and injected with appropriate volume heparin dwells. The venotomy incision was closed with subcutaneous subcuticular 4-0 Vicryl. Dermabond was applied to the incision. The catheter exit site was secured with 0-Prolene retention sutures. COMPLICATIONS: None.  No pneumothorax. FINDINGS: After catheter placement, the tip lies in the right atrium. The catheter aspirates normally and is ready for immediate use. IMPRESSION: Placement of tunneled hemodialysis catheter via the right internal jugular vein. The catheter tip lies in the right atrium. The catheter is ready for immediate use. Electronically Signed   By: Aletta Edouard M.D.   On: 10/26/2019 11:14   IR Fluoro Guide CV Line Right  Result Date: 10/25/2019 INDICATION: 86 year old male in need secondary to acute kidney injury. to acute kidney injury. 86 year old male in need of hemodialysis of hemodialysis secondary EXAM: TUNNELED CENTRAL VENOUS HEMODIALYSIS CATHETER PLACEMENT WITH ULTRASOUND AND FLUOROSCOPIC GUIDANCE MEDICATIONS: 2 g Ancef 2 g Ancef. The antibiotic was given in an appropriate time interval prior to skin puncture. ANESTHESIA/SEDATION: Moderate (conscious) sedation was employed during this procedure. A total of Versed 1 mg and Fentanyl 25 mcg was administered intravenously. Moderate Sedation Time: 14 minutes. The patient's level of consciousness and vital signs were monitored continuously by radiology nursing throughout the procedure under my direct  supervision. FLUOROSCOPY TIME:  Fluoroscopy Time: 0 minutes 36 seconds (3 mGy). COMPLICATIONS: None immediate. PROCEDURE: Informed written consent was obtained from the patient after a discussion of the risks, benefits, and alternatives to treatment. Questions regarding the procedure were encouraged and answered. The right neck and chest were prepped with chlorhexidine in a sterile fashion, and a sterile drape was applied covering the operative field. Maximum barrier sterile technique with sterile gowns and gloves were used for the procedure. A timeout was performed prior to the initiation of the procedure. After creating a small venotomy incision, a micropuncture kit was utilized to access the right internal jugular vein under direct, real-time ultrasound guidance after the overlying soft tissues were anesthetized with 1% lidocaine with epinephrine. Ultrasound image documentation was performed. The microwire was kinked to measure appropriate catheter length. A stiff Glidewire was advanced to the level of the IVC and the micropuncture sheath was exchanged for a peel-away sheath. A palindrome tunneled hemodialysis catheter measuring 19 cm from tip to cuff was tunneled in a retrograde fashion from the anterior chest wall to the venotomy incision. The catheter was then placed through the peel-away sheath with tips ultimately positioned within the superior aspect of the right atrium. Final catheter positioning was confirmed and documented with a spot radiographic image. The catheter aspirates and flushes normally. The catheter was flushed with appropriate volume heparin dwells. The catheter exit site was secured with a 0-Prolene retention suture. The venotomy incision was closed with Dermabond. Dressings were applied. The patient tolerated the procedure well without immediate post  procedural complication. IMPRESSION: Successful placement of 19 cm tip to cuff tunneled hemodialysis catheter via the right internal jugular  vein with tips terminating within the superior aspect of the right atrium. The catheter is ready for immediate use. Electronically Signed   By: Jacqulynn Cadet M.D.   On: 10/25/2019 20:15   IR US Guide Vasc Access Right  Result Date: 10/26/2019 CLINICAL DATA:  Renal failure and status post placement of tunneled hemodialysis catheter yesterday with subsequent pulling and complete dislodgement of the catheter by the patient due to confusion and combativeness. He requires another catheter to be placed for emergent hemodialysis. EXAM: TUNNELED CENTRAL VENOUS HEMODIALYSIS CATHETER PLACEMENT WITH ULTRASOUND AND FLUOROSCOPIC GUIDANCE ANESTHESIA/SEDATION: 0.5 mg IV Versed; 25 mcg IV Fentanyl. Total Moderate Sedation Time:   19 minutes. The patient's level of consciousness and physiologic status were continuously monitored during the procedure by Radiology nursing. MEDICATIONS: 2 g IV Ancef. FLUOROSCOPY TIME:  24 seconds.  3.2 mGy. PROCEDURE: The procedure, risks, benefits, and alternatives were explained to the patient. Questions regarding the procedure were encouraged and answered. The patient understands and consents to the procedure. A timeout was performed prior to initiating the procedure. The right neck and chest were prepped with chlorhexidine in a sterile fashion, and a sterile drape was applied covering the operative field. Maximum barrier sterile technique with sterile gowns and gloves were used for the procedure. Local anesthesia was provided with 1% lidocaine. Ultrasound was used to confirm patency of the right internal jugular vein. After creating a small venotomy incision, a 21 gauge needle was advanced into the right internal jugular vein under direct, real-time ultrasound guidance. Ultrasound image documentation was performed. After securing guidewire access, an 8 Fr dilator was placed. A J-wire was kinked to measure appropriate catheter length. A Palindrome tunneled hemodialysis catheter measuring 19  cm from tip to cuff was chosen for placement. This was tunneled in a retrograde fashion from the chest wall to the venotomy incision. At the venotomy, serial dilatation was performed and a 16 Fr peel-away sheath was placed over a guidewire. The catheter was then placed through the sheath and the sheath removed. Final catheter positioning was confirmed and documented with a fluoroscopic spot image. The catheter was aspirated, flushed with saline, and injected with appropriate volume heparin dwells. The venotomy incision was closed with subcutaneous subcuticular 4-0 Vicryl. Dermabond was applied to the incision. The catheter exit site was secured with 0-Prolene retention sutures. COMPLICATIONS: None.  No pneumothorax. FINDINGS: After catheter placement, the tip lies in the right atrium. The catheter aspirates normally and is ready for immediate use. IMPRESSION: Placement of tunneled hemodialysis catheter via the right internal jugular vein. The catheter tip lies in the right atrium. The catheter is ready for immediate use. Electronically Signed   By: Aletta Edouard M.D.   On: 10/26/2019 11:14   IR US Guide Vasc Access Right  Result Date: 10/25/2019 INDICATION: 86 year old male in need secondary to acute kidney injury. to acute kidney injury. 86 year old male in need of hemodialysis of hemodialysis secondary EXAM: TUNNELED CENTRAL VENOUS HEMODIALYSIS CATHETER PLACEMENT WITH ULTRASOUND AND FLUOROSCOPIC GUIDANCE MEDICATIONS: 2 g Ancef 2 g Ancef. The antibiotic was given in an appropriate time interval prior to skin puncture. ANESTHESIA/SEDATION: Moderate (conscious) sedation was employed during this procedure. A total of Versed 1 mg and Fentanyl 25 mcg was administered intravenously. Moderate Sedation Time: 14 minutes. The patient's level of consciousness and vital signs were monitored continuously by radiology nursing throughout the procedure under my direct  supervision. FLUOROSCOPY TIME:  Fluoroscopy Time: 0  minutes 36 seconds (3 mGy). COMPLICATIONS: None immediate. PROCEDURE: Informed written consent was obtained from the patient after a discussion of the risks, benefits, and alternatives to treatment. Questions regarding the procedure were encouraged and answered. The right neck and chest were prepped with chlorhexidine in a sterile fashion, and a sterile drape was applied covering the operative field. Maximum barrier sterile technique with sterile gowns and gloves were used for the procedure. A timeout was performed prior to the initiation of the procedure. After creating a small venotomy incision, a micropuncture kit was utilized to access the right internal jugular vein under direct, real-time ultrasound guidance after the overlying soft tissues were anesthetized with 1% lidocaine with epinephrine. Ultrasound image documentation was performed. The microwire was kinked to measure appropriate catheter length. A stiff Glidewire was advanced to the level of the IVC and the micropuncture sheath was exchanged for a peel-away sheath. A palindrome tunneled hemodialysis catheter measuring 19 cm from tip to cuff was tunneled in a retrograde fashion from the anterior chest wall to the venotomy incision. The catheter was then placed through the peel-away sheath with tips ultimately positioned within the superior aspect of the right atrium. Final catheter positioning was confirmed and documented with a spot radiographic image. The catheter aspirates and flushes normally. The catheter was flushed with appropriate volume heparin dwells. The catheter exit site was secured with a 0-Prolene retention suture. The venotomy incision was closed with Dermabond. Dressings were applied. The patient tolerated the procedure well without immediate post procedural complication. IMPRESSION: Successful placement of 19 cm tip to cuff tunneled hemodialysis catheter via the right internal jugular vein with tips terminating within the superior  aspect of the right atrium. The catheter is ready for immediate use. Electronically Signed   By: Jacqulynn Cadet M.D.   On: 10/25/2019 20:15   DG CHEST PORT 1 VIEW  Result Date: 10/25/2019 CLINICAL DATA:  Placement of hemodialysis catheter with subsequent removal EXAM: PORTABLE CHEST 1 VIEW COMPARISON:  05/22/2017, 10/25/2019 FINDINGS: No central venous catheter identified on the current image. Enlarged cardiomediastinal silhouette with vascular congestion and bilateral interstitial and ground-glass opacity suspect for edema. Probable left pleural effusion. Aortic atherosclerosis. No pneumothorax. IMPRESSION: Mild cardiomegaly with vascular congestion, and diffuse interstitial and ground-glass opacity, likely pulmonary edema with probable left pleural effusion Electronically Signed   By: Donavan Foil M.D.   On: 10/25/2019 19:18   ECHOCARDIOGRAM COMPLETE  Result Date: 10/26/2019    ECHOCARDIOGRAM REPORT   Patient Name:   Benjamin Watkins Date of Exam: 10/26/2019 Medical Rec #:  [id number]         Height:       74.0 in Accession #:    [id number]        Weight:       222.2 lb Date of Birth:  1933/06/09         BSA:          2.273 m Patient Age:    60 years          BP:           98/53 mmHg Patient Gender: M                 HR:           99 bpm. Exam Location:  Inpatient Procedure: 2D Echo, Color Doppler, Cardiac Doppler and Intracardiac            Opacification Agent Indications:  I48.91* Unspecified atrial fibrillation  History:        Patient has no prior history of Echocardiogram examinations.                 Risk Factors:Hypertension and Diabetes.  Sonographer:    Raquel Sarna Senior RDCS Referring Phys: [place]  Sonographer Comments: Technically difficult due to poor echo windows. IMPRESSIONS  1. Left ventricular ejection fraction, by estimation, is 30 to 35%. The left ventricle has moderately decreased function. The left ventricle demonstrates regional wall motion abnormalities (see scoring  diagram/findings for description). The left ventricular internal cavity size was moderately dilated. Left ventricular diastolic parameters are indeterminate. There is severe hypokinesis to akinesis of all apical segments, to the mid wall level. Function at the basal segments is only mildly hypokinetic. This may be consistent with a stress induced (takotsubo) cardiomyopathy, though multivessel CAD cannot be excluded. LV thrombus excluded by contrast.  2. Right ventricular systolic function is mildly reduced. The right ventricular size is mildly enlarged. There is mildly elevated pulmonary artery systolic pressure.  3. Left atrial size was moderately dilated.  4. Right atrial size was moderately dilated.  5. The mitral valve is grossly normal. Mild mitral valve regurgitation. No evidence of mitral stenosis.  6. The aortic valve has an indeterminant number of cusps. Aortic valve regurgitation is trivial. Mild to moderate aortic valve sclerosis/calcification is present, without any evidence of aortic stenosis. Comparison(s): No prior Echocardiogram. FINDINGS  Left Ventricle: Left ventricular ejection fraction, by estimation, is 30 to 35%. The left ventricle has moderately decreased function. The left ventricle demonstrates regional wall motion abnormalities. Definity contrast agent was given IV to delineate the left ventricular endocardial borders. The left ventricular internal cavity size was moderately dilated. There is no left ventricular hypertrophy. Left ventricular diastolic parameters are indeterminate.  LV Wall Scoring: The mid and distal anterior wall, mid and distal lateral wall, mid and distal anterior septum, entire apex, mid and distal inferior wall, mid anterolateral segment, and mid inferoseptal segment are akinetic. The basal anteroseptal segment, basal inferolateral segment, basal anterolateral segment, basal anterior segment, basal inferior segment, and basal inferoseptal segment are hypokinetic. There  is severe hypokinesis to akinesis of all apical segments, to the mid wall level. Function at the basal segments is only mildly hypokinetic. This may be consistent with a stress induced (takotsubo) cardiomyopathy, though multivessel CAD cannot be excluded. LV thrombus excluded by contrast. Right Ventricle: The right ventricular size is mildly enlarged. Right vetricular wall thickness was not assessed. Right ventricular systolic function is mildly reduced. There is mildly elevated pulmonary artery systolic pressure. The tricuspid regurgitant velocity is 2.57 m/s, and with an assumed right atrial pressure of 8 mmHg, the estimated right ventricular systolic pressure is 42.6 mmHg. Left Atrium: Left atrial size was moderately dilated. Right Atrium: Right atrial size was moderately dilated. Pericardium: Trivial pericardial effusion is present. Mitral Valve: The mitral valve is grossly normal. There is mild thickening of the mitral valve leaflet(s). There is mild calcification of the mitral valve leaflet(s). Mild mitral annular calcification. Mild mitral valve regurgitation. No evidence of mitral valve stenosis. Tricuspid Valve: The tricuspid valve is normal in structure. Tricuspid valve regurgitation is mild . No evidence of tricuspid stenosis. Aortic Valve: The aortic valve has an indeterminant number of cusps. . There is moderate thickening and moderate calcification of the aortic valve. Aortic valve regurgitation is trivial. Mild to moderate aortic valve sclerosis/calcification is present, without any evidence of aortic stenosis. There is  moderate thickening of the aortic valve. There is moderate calcification of the aortic valve. Pulmonic Valve: The pulmonic valve was not well visualized. Pulmonic valve regurgitation is not visualized. No evidence of pulmonic stenosis. Aorta: The aortic root and ascending aorta are structurally normal, with no evidence of dilitation. Pulmonary Artery: The pulmonary artery is not well  seen. Venous: The inferior vena cava was not well visualized. IAS/Shunts: No atrial level shunt detected by color flow Doppler.  LEFT VENTRICLE PLAX 2D LVIDd:         5.40 cm  Diastology LVIDs:         4.30 cm  LV e' lateral: 7.18 cm/s LV PW:         1.10 cm  LV e' medial:  2.50 cm/s LV IVS:        1.10 cm LVOT diam:     2.10 cm LV SV:         53 LV SV Index:   23 LVOT Area:     3.46 cm  RIGHT VENTRICLE RV S prime:     7.62 cm/s TAPSE (M-mode): 1.6 cm LEFT ATRIUM              Index       RIGHT ATRIUM           Index LA diam:        3.60 cm  1.58 cm/m  RA Area:     28.10 cm LA Vol (A2C):   94.2 ml  41.44 ml/m RA Volume:   95.30 ml  41.93 ml/m LA Vol (A4C):   102.0 ml 44.87 ml/m LA Biplane Vol: 102.0 ml 44.87 ml/m  AORTIC VALVE LVOT Vmax:   77.40 cm/s LVOT Vmean:  56.700 cm/s LVOT VTI:    0.153 m  AORTA Ao Root diam: 3.60 cm TRICUSPID VALVE TR Peak grad:   26.4 mmHg TR Vmax:        257.00 cm/s  SHUNTS Systemic VTI:  0.15 m Systemic Diam: 2.10 cm Buford Dresser MD Electronically signed by Buford Dresser MD Signature Date/Time: 10/26/2019/3:28:10 PM    Final    Assessment and Plan:  Benjamin Watkins is a 86 y.o. caucasian male with    1. Chronic Lymphocytic Leukemia The above w/u was reviewed and diagnosis of CLL was made based on PBS and flow cytometry results Likely Rai 0 The patient presented with thrombocytopenia PLT 117k (not <100k to count as staging criteria) and did not have anemia or clinical splenomegaly at the time of presentation. 11/24/17 FISH CLL Prognostic panel did not reveal a standard mutation.  2. Anemia and thrombocytopenia  This is likely multifactorial and due to CLL, CKD, and recent venetoclax  3. Acute on chronic CKD Etiology unclear - ?  Acute interstitial nephritis  4. Atrial fibrillation with RVR  PLAN: -Labs from 10/31/2019: WBC 183.2, hemoglobin 7.0, platelets 71,000, potassium 5.5, BUN 95, creatinine 5.63, calcium 7.2, phosphorus 6.8, albumin  2.4. -Continue to hold venetoclax.  Discussed with the patient and his family that additional treatment for CLL could be considered in the future if his renal function improves atrial fibrillation is controlled.  They are aware that his performance status is too poor to consider additional therapies and that his AKI and atrial fibrillation preclude treatment at this point in time.. -Agree with consult to palliative care to have further discussion regarding goals of care. -Recommend PRBC transfusion to keep hemoglobin above 7. -Nephrology is following closely.  Renal function has not shown much  improvement but he still has poor urine output. Planning for dialysis again today.  He is having intermittent hypotension which may limit his ability to receive ongoing dialysis treatments. -On amiodarone per cardiology, unable to add beta-blocker or hydralazine due to hypotension and unable to add ACE/ARB due to AKI.  Mikey Bussing, DNP, AGPCNP-BC, AOCNP

## 2019-10-31 NOTE — Progress Notes (Signed)
PROGRESS NOTE    Benjamin Watkins  ELF:[id number] DOB: February 25, 1933 DOA: 10/18/2019 PCP: [name], MD   Brief Narrative:  86 y.o.malewith PMH of CLL, HTN, DM2, ckd3 Patient was sent to ED from oncology office for abnormal labs including creatinine of 4.58.  Patient follows up with Dr. [name] for CLL, was recently started on venetoclax Repeat blood work was obtained on 3/23. noted creatinine of 4.58. Patient was admitted to hospitalist service for acute kidney injury on CKD stage 3 at [facility] course complicated by metabolic encephalopathy, uremia, A. 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 -Baseline creatinine between 2-2.2 -Presented with a creatinine of 4.75, then gradually trended up to a peak of 8.30 on 3/31 despite IV hydration, [place] without obstruction -Nephrology following. Suspected drug-induced kidney injury secondary to [place], -patient was also on chlorthalidone, PPI. -Offending meds have been stopped. -Pt was transferred to [facility] to initiate HD.  -Pt s/p RIJ tunneled cath 3/30 by IR however patient pulled that out.  He had another right IJ tunnel catheter placement on 10/26/2019.  Received his first dialysis session on the evening of 10/26/2019 followed by 4/1 and 4/3. -Urine output is poor, no signs of renal recovery yet, urine output remains poor -I picked up patient's care from my partner on 4/4 , upon further discussion with patient's son [name] by myself on 4/4 and 4/5 as well as renal MD today, [name] tells me patient would not want CPR and life support, will update CODE STATUS to DNR -[name] also thinks that his dad would not want long-term dialysis -Palliative medicine consulted for goals of care -Also appreciate renal MD conversation with family today -Plan for dialysis today, this is also limited by ongoing  hypotension  Acute metabolic encephalopathy -Felt to be secondary to uremia, improving but still with some confusion, mittens on for restraints, pulled out HD catheter a few days ago -Improving -Delirium precaution  CLL (chronic lymphocytic leukemia) -WBC was 82.5 on admission, progressively increasing and now 172K today.  It was in 30-60 range in the past -Oncology aware of admission , and recommended to previous providers to continue medical treatment for AKI and other issues as appropriate  -Venclexta has been on hold.   -Care has been discussed with patient's oncologist Dr. [name] throughout this admission, following remotely  Anemia of chronic disease -No active bleeding, received 3 units of PRBC this admission, felt to be secondary to chronic disease, progressive CLL -Hemoglobin slightly lower, 7.0 today will transfuse 1 unit of PRBC with HD which will also help with his blood pressure -FU anemia panel  DM (diabetes mellitus), type 2  -Home meds include glimepiride, ReliOn 70/30 insulin, 70 units twice a day -CBG stable now, Lantus discontinued, sliding scale insulin only for now  Essential hypertension: now HYpotensive -Home meds include Coreg 25 mg twice daily and chlorthalidone.  Coreg was stopped on 10/25/2019 when he developed atrial fibrillation and was switched to amiodarone.  Blood pressure remains on the low side.   -Chlorthalidone on hold -I think low BP is multifactorial, due to CHF, Anemia etc,  -Started on midodrine, increase dose  New onset atrial fibrillation with rapid ventricular response: - Late afternoon of 10/25/2019, patient developed new onset atrial fibrillation with rapid ventricular response.  Due to low blood pressure, he was started on amiodarone drip as well as heparin  drip.  -Cardiology following, appreciate input -Back in sinus rhythm, IV heparin was stopped in the setting of ongoing severe anemia requiring blood transfusions -Remains on IV  amiodarone, plan to transition to p.o. today  Combined systolic and diastolic congestive heart failure: - Echo shows 30 to 35% ejection fraction.   -Per cardiology, likely stress cardiomyopathy however cannot rule out coronary artery disease.  Will defer further management to cardiology. -Now volume being managed with dialysis  Hyperkalemia: Resolved.  DVT prophylaxis: Heparin SQ Code Status: DNR based on my discussion with patient's son Arlie Riker 4/5 Family Communication: No family at bedside, updated son [name] 4/4, 4/5 Disposition Plan: SNF pending improvement in kidney function, A. fib RVR, metabolic encephalopathy  Consultants:   Nephrology  IR  Cardiology  Procedures:   Tunneled HD cath placed by IR 10/25/19  Repeat tunneled HD catheter placement by IR on 10/26/2019  Antimicrobials: Anti-infectives (From admission, onward)   Start     Dose/Rate Route Frequency Ordered Stop   10/26/19 0753  ceFAZolin (ANCEF) 2-4 GM/100ML-% IVPB    Note to Pharmacy: [name]   : cabinet override      10/26/19 0753 10/26/19 1959   10/25/19 1500  ceFAZolin (ANCEF) IVPB 2g/100 mL premix  Status:  Discontinued     2 g 200 mL/hr over 30 Minutes Intravenous To Radiology 10/24/19 1619 10/25/19 1916   10/25/19 1228  ceFAZolin (ANCEF) IVPB 1 g/50 mL premix     over 30 Minutes  Continuous PRN 10/25/19 1233 10/25/19 1228   10/25/19 1217  ceFAZolin (ANCEF) 2-4 GM/100ML-% IVPB    Note to Pharmacy: [name]   : cabinet override      10/25/19 1217 10/26/19 0029      Subjective: -Mild shortness of breath -Intermittent confusion and hypotension last night  Objective: Vitals:   10/31/19 1330 10/31/19 1334 10/31/19 1340 10/31/19 1400  BP: (!) 81/58 (!) 76/44 (!) 91/46 (!) 99/49  Pulse: 85 74 84 76  Resp: (!) 30     Temp: (!) 97.5 F (36.4 C)     TempSrc: Oral     SpO2: 100%     Weight: 104.2 kg     Height:        Intake/Output Summary (Last 24 hours) at 10/31/2019 1406 Last  data filed at 10/31/2019 0800 Gross per 24 hour  Intake 1006.73 ml  Output 200 ml  Net 806.73 ml   Filed Weights   10/30/19 0523 10/31/19 0412 10/31/19 1330  Weight: 107 kg 107.5 kg 104.2 kg    Examination:  Gen: Elderly chronically ill male laying in bed awake alert oriented to self and partly to place, no distress HEENT: Right IJ HD catheter Lungs: Diminished breath sounds at both bases CVS: RRR,No Gallops,Rubs or new Murmurs Abd: soft, Non tender, non distended, BS present Extremities: Trace edema, chronic venous stasis changes  skin: As above Psychiatry: Poor insight and judgment  Data Reviewed: I have personally reviewed following labs and imaging studies  CBC: Recent Labs  Lab 10/28/19 0702 10/28/19 0702 10/28/19 1641 10/28/19 1641 10/29/19 0905 10/29/19 1528 10/29/19 2342 10/30/19 0601 10/31/19 0356  WBC 175.3*  --  161.1*  --  169.3*  --   --  174.0* 183.2*  HGB 7.2*   < > 6.4*   < > 7.1* 6.5* 7.7* 7.3* 7.0*  HCT 23.4*   < > 20.8*   < > 23.2* 21.1* 24.7* 23.3* 23.5*  MCV 103.5*  --  101.5*  --  103.1*  --   --  98.3 100.0  PLT 92*  --  79*  --  74*  --   --  65* 71*   < > = values in this interval not displayed.   Basic Metabolic Panel: Recent Labs  Lab 10/26/19 0508 10/26/19 1436 10/28/19 0702 10/28/19 1641 10/29/19 0724 10/30/19 0601 10/31/19 0356  NA 142   < > 142 142 141 141 141  K 6.5*   < > 7.1* 6.0* 5.1 4.4 5.5*  CL 112*   < > 103 105 102 105 103  CO2 15*   < > 23 24 24 26 23   GLUCOSE 171*   < > 258* 273* 308* 147* 94  BUN 141*   < > 134* 81* 104* 72* 95*  CREATININE 8.30*   < > 7.40* 4.75* 5.66* 4.33* 5.63*  CALCIUM 7.0*   < > 7.1* 7.0* 7.2* 7.2* 7.2*  MG 2.1  --   --   --   --   --   --   PHOS 6.6*   < > 6.9* 4.8* 6.2* 5.0* 6.8*   < > = values in this interval not displayed.   GFR: Estimated Creatinine Clearance: 12.6 mL/min (A) (by C-G formula based on SCr of 5.63 mg/dL (H)). Liver Function Tests: Recent Labs  Lab 10/28/19 0702  10/28/19 1641 10/29/19 0724 10/30/19 0601 10/31/19 0356  ALBUMIN 2.5* 2.4* 2.6* 2.3* 2.4*   No results for input(s): LIPASE, AMYLASE in the last 168 hours. No results for input(s): AMMONIA in the last 168 hours. Coagulation Profile: No results for input(s): INR, PROTIME in the last 168 hours. Cardiac Enzymes: No results for input(s): CKTOTAL, CKMB, CKMBINDEX, TROPONINI in the last 168 hours. BNP (last 3 results) No results for input(s): PROBNP in the last 8760 hours. HbA1C: No results for input(s): HGBA1C in the last 72 hours. CBG: Recent Labs  Lab 10/30/19 2352 10/31/19 0317 10/31/19 0615 10/31/19 0750 10/31/19 1119  GLUCAP 140* 78 93 91 131*   Lipid Profile: No results for input(s): CHOL, HDL, LDLCALC, TRIG, CHOLHDL, LDLDIRECT in the last 72 hours. Thyroid Function Tests: No results for input(s): TSH, T4TOTAL, FREET4, T3FREE, THYROIDAB in the last 72 hours. Anemia Panel: No results for input(s): VITAMINB12, FOLATE, FERRITIN, TIBC, IRON, RETICCTPCT in the last 72 hours. Sepsis Labs: No results for input(s): PROCALCITON, LATICACIDVEN in the last 168 hours.  No results found for this or any previous visit (from the past 240 hour(s)).   Radiology Studies: No results found.  Scheduled Meds: . sodium chloride   Intravenous Once  . amiodarone  200 mg Oral BID  . aspirin EC  81 mg Oral Daily  . calcium acetate  667 mg Oral TID WC  . Chlorhexidine Gluconate Cloth  6 each Topical Q0600  . feeding supplement (PRO-STAT SUGAR FREE 64)  30 mL Per Tube BID  . heparin injection (subcutaneous)  5,000 Units Subcutaneous Q8H  . insulin aspart  0-15 Units Subcutaneous Q4H  . midodrine  10 mg Oral TID WC  . sodium chloride flush  10-40 mL Intracatheter Q12H   Continuous Infusions: . sodium chloride    . sodium chloride    . sodium chloride    . sodium chloride    . feeding supplement (OSMOLITE 1.5 CAL) Stopped (10/29/19 0001)     LOS: 13 days   Total time spent 35  minutes  Domenic Polite, MD [facility]  10/31/2019, 2:06 PM

## 2019-10-31 NOTE — Progress Notes (Signed)
  Speech Language Pathology Treatment: Dysphagia  Patient Details Name: Benjamin Watkins MRN: [id number] DOB: [birth year] Today's Date: 10/31/2019 Time: 2482-5003 SLP Time Calculation (min) (ACUTE ONLY): 15 min  Assessment / Plan / Recommendation Clinical Impression  Pt with hypotension this am, prompting rapid response consult.  Pt alert, confused on arrival this am.  Repositioned in bed to optimize participation.  VS stable. Oriented to person.  Able to feed himself crackers, peaches with help to secure container, water with no overt s/s of aspiration.  Oral preparation is prolonged, with pt stating "it all balls up in there," and verbal prompts needed to clear oral cavity.  Recommend advancing diet to dysphagia 2 with the hopes it may improve appetite. Continue thin liqudis and meds crushed.  SLP to follow briefly.   HPI HPI: Patient is an 86 y.o. male with PMH: CLL, HTN, DM-2 who presents from oncology office for abnormal labs.  Patient reported to admitting MD that he was told to drink more water but he cannot as he sleeps all day. He also reported he has not been eating much, feels weak and is occasionally light-headed when he walks. Patient was admitted for acute kidney injury and dehydration.      SLP Plan  Continue with current plan of care       Recommendations  Diet recommendations: Dysphagia 2 (fine chop);Thin liquid Liquids provided via: Cup;Straw Medication Administration: Crushed with puree Supervision: Patient able to self feed;Staff to assist with self feeding Compensations: Minimize environmental distractions;Slow rate;Small sips/bites Postural Changes and/or Swallow Maneuvers: Seated upright 90 degrees                Oral Care Recommendations: Oral care BID SLP Visit Diagnosis: Dysphagia, unspecified (R13.10) Plan: Continue with current plan of care       GO              Benjamin Watkins L. Tivis Ringer, [place] Office number  [phone] Pager [phone]   Benjamin Watkins 10/31/2019, 11:32 AM

## 2019-10-31 NOTE — Procedures (Signed)
Patient was seen on dialysis and the procedure was supervised.  BFR 400  Via TDC BP is  70s SBP.  Blood pressure dropping during dialysis.  Unable to ultrafiltrate although he is edematous.  I do not think he can tolerate outpatient HD treatment.  At this time I recommend hospice comfort care.  Discussed with the primary team and oncology team.   Rosita Fire 10/31/2019

## 2019-10-31 NOTE — Progress Notes (Signed)
PT Cancellation Note  Patient Details Name: Benjamin Watkins MRN: [id number] DOB: 01/22/1933   Cancelled Treatment:    Reason Eval/Treat Not Completed: Patient at procedure or test/unavailable. PT attempted x2, each time, the pt was off floor for HD treatment, PT will continue to follow and treat as time/schedule allow.   Karma Ganja, PT, DPT   Acute Rehabilitation Department Pager #: [phone]  Otho Bellows 10/31/2019, 2:51 PM

## 2019-10-31 NOTE — Progress Notes (Signed)
Patient's BP was 79/61 mmHg, manual BP taken and resulted at 84/38 mmHg. Bodenheimer, MD and RRT consulted. 100 mL NS bolus x 2 & 5 mg midodrine administered. Patient's BP reassessed to be 91/47 @ 2140. Will continue to monitor.   Elaina Hoops, RN

## 2019-10-31 NOTE — Progress Notes (Signed)
[facility] NEPHROLOGY PROGRESS NOTE  Assessment/ Plan: Pt is a 86 y.o. yo male with history of CLL, CKD who was transferred from [place] for AKI on CKD with uremia.  Started dialysis.  #Acute kidney injury on CKD likely hemodynamically mediated with A. fib with RVR and possible concern for AIN given rash on presentation.  He remains hypotensive with no sign of renal recovery.  Oliguric with urine output of only 200 cc.  Plan for hemodialysis today.  HD has been limited by frequent hypotension.  I am worried that he may not  tolerate outpatient dialysis.  I have discussed this with the patient's wife and son today.  Palliative care consult requested.  #Hyperkalemia: Likely pseudohyperkalemia in the setting of CLL/leukocytosis.  #CLL followed by Dr. [name]: Elevated WBC count and has thrombocytopenia.  Oncology is following.  #Anemia of chronic illness/CLL: Check iron studies.  Hematologist following.  # CKD-MBD: Phosphorus level elevated.  Start PhosLo.  Check PTH level  #A. fib with RVR: New onset.  On amiodarone.  Cardiology following.  #Acute on chronic combined CHF with EF of 30 to 35%: Blood pressure is soft.  UF limited by hypotension during HD.  #Hypotension: Increase midodrine to 10 mg 3 times daily.  #Acute metabolic encephalopathy.  Subjective: Seen and examined at bedside.  Patient's wife and son present.  Patient was on oxygen.  Denied nausea vomiting chest pain shortness of breath.  Review of system limited. Objective Vital signs in last 24 hours: Vitals:   10/31/19 0807 10/31/19 0825 10/31/19 1116 10/31/19 1219  BP:  (!) 114/49  (!) 98/59  Pulse: 84 91  84  Resp: (!) 23 (!) 24  (!) 26  Temp:   97.9 F (36.6 C)   TempSrc:   Axillary   SpO2: 100% 100%  100%  Weight:      Height:       Weight change: 0.8 kg  Intake/Output Summary (Last 24 hours) at 10/31/2019 1327 Last data filed at 10/31/2019 0800 Gross per 24 hour  Intake 1124.73 ml  Output 200  ml  Net 924.73 ml       Labs: Basic Metabolic Panel: Recent Labs  Lab 10/29/19 0724 10/30/19 0601 10/31/19 0356  NA 141 141 141  K 5.1 4.4 5.5*  CL 102 105 103  CO2 24 26 23   GLUCOSE 308* 147* 94  BUN 104* 72* 95*  CREATININE 5.66* 4.33* 5.63*  CALCIUM 7.2* 7.2* 7.2*  PHOS 6.2* 5.0* 6.8*   Liver Function Tests: Recent Labs  Lab 10/29/19 0724 10/30/19 0601 10/31/19 0356  ALBUMIN 2.6* 2.3* 2.4*   No results for input(s): LIPASE, AMYLASE in the last 168 hours. No results for input(s): AMMONIA in the last 168 hours. CBC: Recent Labs  Lab 10/28/19 0702 10/28/19 0702 10/28/19 1641 10/28/19 1641 10/29/19 0905 10/29/19 1528 10/29/19 2342 10/30/19 0601 10/31/19 0356  WBC 175.3*   < > 161.1*   < > 169.3*  --   --  174.0* 183.2*  HGB 7.2*   < > 6.4*   < > 7.1*   < > 7.7* 7.3* 7.0*  HCT 23.4*   < > 20.8*   < > 23.2*   < > 24.7* 23.3* 23.5*  MCV 103.5*  --  101.5*  --  103.1*  --   --  98.3 100.0  PLT 92*   < > 79*   < > 74*  --   --  65* 71*   < > =  values in this interval not displayed.   Cardiac Enzymes: No results for input(s): CKTOTAL, CKMB, CKMBINDEX, TROPONINI in the last 168 hours. CBG: Recent Labs  Lab 10/30/19 2352 10/31/19 0317 10/31/19 0615 10/31/19 0750 10/31/19 1119  GLUCAP 140* 78 93 91 131*    Iron Studies: No results for input(s): IRON, TIBC, TRANSFERRIN, FERRITIN in the last 72 hours. Studies/Results: No results found.  Medications: Infusions: . sodium chloride    . sodium chloride    . sodium chloride    . sodium chloride    . feeding supplement (OSMOLITE 1.5 CAL) Stopped (10/29/19 0001)    Scheduled Medications: . sodium chloride   Intravenous Once  . amiodarone  200 mg Oral BID  . aspirin EC  81 mg Oral Daily  . Chlorhexidine Gluconate Cloth  6 each Topical Q0600  . feeding supplement (PRO-STAT SUGAR FREE 64)  30 mL Per Tube BID  . heparin injection (subcutaneous)  5,000 Units Subcutaneous Q8H  . insulin aspart  0-15 Units  Subcutaneous Q4H  . midodrine  10 mg Oral BID WC  . sodium chloride flush  10-40 mL Intracatheter Q12H    have reviewed scheduled and prn medications.  Physical Exam: General:NAD, comfortable Heart:RRR, s1s2 nl Lungs:clear b/l, no crackle Abdomen:soft, Non-tender, non-distended Extremities: Trace LE edema Dialysis Access: Right IJ tunnel HD catheter.  Zenia Guest Tanna Furry 10/31/2019,1:27 PM  LOS: 13 days  Pager: [phone]

## 2019-10-31 NOTE — Progress Notes (Signed)
Palliative Medicine Inpatient Follow Up Note  Addendum to initial consult note: Today's Discussion (10/31/2019): Patient did not tolerate HD, had hypotension, unable to remove fluid. Called patient son, [name]. He is in agreement with an emphasis on comfort. Transition to a comfort care reviewed medications to control pain, dyspnea, agitation, nausea, itching, and hiccups. Discussed stopping all uneccessary measures such as blood draws, needle sticks, and frequent vital signs.     Discussed with patient the importance of continued conversation with family and their  medical providers regarding overall plan of care and treatment options, ensuring decisions are within the context of the patients values and GOCs.  Questions and concerns addressed   Subjective Assessment: Patient is complaining of generalized pain at the time of assessment. Noted to be tachypneic.   Vital Signs Vitals:   10/31/19 1614 10/31/19 1636  BP: (!) 97/40 (!) 108/48  Pulse: 80 73  Resp: 19 16  Temp: 98.2 F (36.8 C) 97.8 F (36.6 C)  SpO2: 100% 100%    Intake/Output Summary (Last 24 hours) at 10/31/2019 1733 Last data filed at 10/31/2019 1636 Gross per 24 hour  Intake 1030.72 ml  Output 85 ml  Net 945.72 ml   Last Weight  Most recent update: 10/31/2019  1:49 PM   Weight  104.2 kg (229 lb 11.5 oz)           Gen:  Elderly obese M, ill appearing HEENT: Dry mucous membranes CV: Regular rate and rhythm, no murmurs rubs or gallops PULM: On 2LPM [place], tachypnea, decreased breath sounds (+) fine crackles ABD: soft/nontender  EXT: Generalized edema Neuro: Disoriented  SUMMARY OF RECOMMENDATIONS   DNAR/DNI  Transition to comfort care  TOC --> Consult for [facility]  SW --> Consult for ALF information for patients wife (memory impairment)  Code Status/Advance Care Planning:  DNR  Symptom Management:  Dyspnea: Pain:  - Fentanyl gtt initiated  - Fentanyl 25-30mcg Q1H PRN Fever:  -  Tylenol 650mg  PO/PR Q6H PRN Agitation: Anxiety:  - Lorazepam 0.5-1mg  IV  Q1H PRN Nausea:  - Zofran 4mg  PO/IV Q6H PRN  Secretions:  - Glycopyrrolate 0.4mg  IV Q4H ATC Dry Eyes:  - Artificial Tears PRN Xerostomia:  - Biotene 53ml PRN  - BID oral care Urinary Retention:  - Maintain foley  Constipation:  - Bisacodyl 10mg  PR PRN QDay Dysphagia:                 - Swallow evaluation                  - Fine chopped meals with thin liquids Delirium: - Delirium precautions - Get up during the day - Encourage a familiar face to remain present throughout the day - Keep blinds open and lights on during daylight hours - Minimize the use of opioids/benzodiazepines Spiritual:  - No chaplain consult  Active Hospital Problems: Acute kidney injury on [id number] Acute metabolic encephalopathy CLL (chronic lymphocytic leukemia) Anemia of chronic disease DM (diabetes mellitus), type 2  Essential hypertension: now HYpotensive New onset atrial fibrillation with rapid ventricular response: Combined systolic and diastolic congestive heart failure: Hyperkalemia  Discussed with Dr. Broadus John and Dr. Carolin Sicks  Time In: 1640 Time Out:1715 Time Spent: 35 Greater than 50% of the time was spent in counseling and coordination of care ______________________________________________________________________________________ [place] Team Team Cell Phone: [phone] Please utilize secure chat with additional questions, if there is no response within 30 minutes please call the above phone number  Palliative Medicine Team providers are available by phone from 7am to 7pm daily and can be reached through the team cell phone.  Should this patient require assistance outside of these hours, please call the patient's attending physician.

## 2019-11-01 DIAGNOSIS — N186 End stage renal disease: Secondary | ICD-10-CM

## 2019-11-01 DIAGNOSIS — I4891 Unspecified atrial fibrillation: Secondary | ICD-10-CM

## 2019-11-01 DIAGNOSIS — N179 Acute kidney failure, unspecified: Secondary | ICD-10-CM

## 2019-11-01 LAB — BPAM RBC
Blood Product Expiration Date: 202104302359
Blood Product Expiration Date: 202104302359
Blood Product Expiration Date: 202104302359
Blood Product Expiration Date: 202105032359
ISSUE DATE / TIME: 202104030054
ISSUE DATE / TIME: 202104031818
ISSUE DATE / TIME: 202104051529
Unit Type and Rh: 5100
Unit Type and Rh: 5100
Unit Type and Rh: 5100
Unit Type and Rh: 5100

## 2019-11-01 LAB — TYPE AND SCREEN
ABO/RH(D): O POS
Antibody Screen: NEGATIVE
Unit division: 0
Unit division: 0
Unit division: 0
Unit division: 0

## 2019-11-01 MED ORDER — MORPHINE SULFATE (CONCENTRATE) 20 MG/ML PO SOLN: 5.0000 mg | ORAL | 0 refills | Status: AC | PRN

## 2019-11-01 MED ORDER — LORAZEPAM 2 MG/ML PO CONC: 1.0000 mg | ORAL | 0 refills | Status: AC | PRN

## 2019-11-01 NOTE — Discharge Summary (Signed)
Physician Discharge Summary  Benjamin Watkins YKD:[id number] DOB: 1932-10-18 DOA: 10/18/2019  PCP: [name], MD  Admit date: 10/18/2019 Discharge date: 11/01/2019  Time spent: 29mnutes  Recommendations for Outpatient Follow-up:  Residential hospice for end-of-life care  Discharge Diagnoses:  Principal Problem:   AKI ([place] kidney [place]) ([name] Chronic kidney disease stage III Progressive CLL A. fib with RVR Acute systolic CHF   Dehydration   CLL (chronic lymphocytic leukemia) (HCC)   Anemia   DM (diabetes mellitus), type 2 (HCC)   Malnutrition of moderate degree   Pressure injury of skin   Palliative care by specialist   Goals of care, counseling/discussion   DNR (do not resuscitate)   Terminal care   Dysphagia   [place]   ARF (acute renal failure) ([name]   ESRD (end stage renal disease) ([name]   Discharge Condition: Poor  Diet recommendation: Comfort feeds  Filed Weights   10/30/19 0523 10/31/19 0412 10/31/19 1330  Weight: 107 kg 107.5 kg 104.2 kg    History of present illness:  86y.o.malewith PMH of CLL, HTN, DM2, ckd3 Patient was sent to ED from oncology office for abnormal labs including creatinine of 4.58.  Patient follows up with Dr. KIrene Limbofor CLL, was recently started on venetoclax Repeat blood work was obtained on 3/23. noted creatinine of 4.58. Patient [name]  Hospital Course:   Acute kidney injury on CKD3B. -Baseline creatinine between 2-2.2 -Presented with a creatinine of 4.75, then gradually trended up to a peak of 8.30 on 3/31 despite IV hydration, [place]ore[place]ithout obstruction -Seen and followed by nephrology through this admission, etiology suspected to be drug-induced kidney injury secondary to VPhilo -patient was also on chlorthalidone, PPI. -Offending meds have been stopped. -[facility]  -Pt s/p RIJ  tunneled cath 3/30 by IR however patient pulled that out.  He had another right IJ tunnel catheter placement on 10/26/2019.  Received his first dialysis session on the evening of 10/26/2019 followed by 4/1 and 4/3. -Urine output is poor, no signs of renal recovery yet, urine output remains poor -I picked up patient's care from my partner on 4/4 , upon further discussion with patient's son Benjamin Severinby myself on 4/4 and 4/5 as well as renal MD, Benjamin Severintells me patient would not want CPR and life support, long-term dialysis  -Subsequently he had a palliative consultation for goals of care ,, on 4/5 he had persistent hypotension on hemodialysis, nephrology felt patient was unable to tolerate acute as well as less likely long-term dialysis -After this had further discussions with family and palliative medicine and decision was made for comfort focused care, DNR, no more dialysis -Discharged to his residential hospice for end-of-life care  Acute metabolic encephalopathy -Felt to be secondary to uremia, improving but still with some confusion, mittens on for restraints, pulled out HD catheter a few days ago -Improving, now comfort care  CLL (chronic lymphocytic leukemia) -WBC was 82.5 on admission, progressively increasing and now 172K today.  It was in 30-60 range in the past -Oncology aware of admission , and recommended to previous providers to continue medical treatment for AKI and other issues as appropriate  -Venclexta discontinued due to acute -Followed by oncology this admission  Anemia of chronic disease -No active bleeding, received 4 units of PRBC this admission, felt to be secondary to chronic disease, progression of CLL  DM (diabetes mellitus), type 2  -Home meds include glimepiride, ReliOn 70/30 insulin, 70 units twice a day -Hypoglycemic this admission, Lantus discussed Essential hypertension: now HYpotensive -Home meds include Coreg 25 mg twice daily and chlorthalidone.  Coreg was  stopped on 10/25/2019 when he developed atrial fibrillation and was switched to amiodarone.  Blood pressure remains on the low side.   -I think low BP is multifactorial, due to CHF, now ESRD, anemia etc,  -Started on midodrine, dose increased, -Unfortunately his blood pressure continues to drop on dialysis, to the extent that he is unable to tolerate hemodialysis  New onset atrial fibrillation with rapid ventricular response: - Late afternoon of 10/25/2019, patient developed new onset atrial fibrillation with rapid ventricular response.  Due to low blood pressure, he was started on amiodarone drip as well as heparin drip.  -Cardiology following, appreciate input -Back in sinus rhythm, IV heparin was stopped in the setting of ongoing severe anemia requiring blood transfusions -Treated with IV amiodarone  Combined systolic and diastolic congestive heart failure: - Echo shows 30 to 35% ejection fraction.   -Per cardiology, likely stress cardiomyopathy however cannot rule out coronary artery disease.   Hyperkalemia: Resolved.  Discharge Exam: Vitals:   11/01/19 0625 11/01/19 1324  BP: (!) 104/51 (!) 102/50  Pulse: 86 94  Resp: (!) 22 18  Temp: (!) 97.5 F (36.4 C) (!) 97.5 F (36.4 C)  SpO2:  100%    General: Alert, awake, oriented to self only Cardiovascular: S1S2/RRR Respiratory: Diminished breath sounds  Discharge Instructions    Allergies as of 11/01/2019      Reactions   Hydrochlorothiazide Other (See Comments)   "It just bothers me"   Lisinopril Other (See Comments)   "Takes away energy"      Medication List    STOP taking these medications   aspirin EC 81 MG tablet   carvedilol 25 MG tablet Commonly known as: COREG   chlorthalidone 25 MG tablet Commonly known as: HYGROTON   glimepiride 2 MG tablet Commonly known as: AMARYL   niacin 500 MG tablet   NovoLIN 70/30 ReliOn (70-30) 100 UNIT/ML injection Generic drug: insulin NPH-regular Human   venetoclax  100 MG Tabs   Vitamin B-12 1000 MCG Subl     TAKE these medications   acetaminophen 325 MG tablet Commonly known as: TYLENOL Take 650 mg by mouth as needed for mild pain.   LORazepam 2 MG/ML concentrated solution Commonly known as: ATIVAN Place 0.5 mLs (1 mg total) under the tongue every 4 (four) hours as needed for anxiety.   morphine 20 MG/ML concentrated solution Commonly known as: ROXANOL Take 0.25 mLs (5 mg total) by mouth every 4 (four) hours as needed for moderate pain or shortness of breath.   omeprazole 20 MG capsule Commonly known as: PRILOSEC Take 20 mg by mouth daily.   SYSTANE ULTRA OP Place 2 drops into both eyes daily as needed (For dry eyes.).      Allergies  Allergen Reactions  . Hydrochlorothiazide Other (See Comments)    "It just bothers me"  . Lisinopril Other (See Comments)    "Takes away energy"      The results of significant diagnostics from this hospitalization (including imaging, microbiology, ancillary and laboratory) are listed below for reference.    Significant Diagnostic Studies: US RENAL  Result Date: 10/20/2019 CLINICAL DATA:  Acute renal failure. EXAM: RENAL / URINARY TRACT ULTRASOUND COMPLETE COMPARISON:  None. FINDINGS: Right Kidney: Renal measurements: 12.5 x 5.8 x  7.1 cm = volume: 272 mL . Echogenicity within normal limits. No mass or hydronephrosis visualized. 1.3 cm cyst over the upper pole. Left Kidney: Renal measurements: 0.5 x 5.9 x 6.5 cm = volume: 253 mL. Echogenicity within normal limits. No mass or hydronephrosis visualized. Bladder: Appears normal for degree of bladder distention. Bilateral ureteral jets visualized. Other: Mild prominence of the prostate gland measuring 5.8 x 5.0 x 3.6 cm = volume 54.8 cubic cm. IMPRESSION: 1.  Normal size kidneys without hydronephrosis. 2.  1.3 cm right renal cyst. 3.  Mild prostatic enlargement. Electronically Signed   By: Marin Olp M.D.   On: 10/20/2019 15:37   IR Fluoro Guide CV Line  Right  Result Date: 10/26/2019 CLINICAL DATA:  Renal failure and status post placement of tunneled hemodialysis catheter yesterday with subsequent pulling and complete dislodgement of the catheter by the patient due to confusion and combativeness. He requires another catheter to be placed for emergent hemodialysis. EXAM: TUNNELED CENTRAL VENOUS HEMODIALYSIS CATHETER PLACEMENT WITH ULTRASOUND AND FLUOROSCOPIC GUIDANCE ANESTHESIA/SEDATION: 0.5 mg IV Versed; 25 mcg IV Fentanyl. Total Moderate Sedation Time:   19 minutes. The patient's level of consciousness and physiologic status were continuously monitored during the procedure by Radiology nursing. MEDICATIONS: 2 g IV Ancef. FLUOROSCOPY TIME:  24 seconds.  3.2 mGy. PROCEDURE: The procedure, risks, benefits, and alternatives were explained to the patient. Questions regarding the procedure were encouraged and answered. The patient understands and consents to the procedure. A timeout was performed prior to initiating the procedure. The right neck and chest were prepped with chlorhexidine in a sterile fashion, and a sterile drape was applied covering the operative field. Maximum barrier sterile technique with sterile gowns and gloves were used for the procedure. Local anesthesia was provided with 1% lidocaine. Ultrasound was used to confirm patency of the right internal jugular vein. After creating a small venotomy incision, a 21 gauge needle was advanced into the right internal jugular vein under direct, real-time ultrasound guidance. Ultrasound image documentation was performed. After securing guidewire access, an 8 Fr dilator was placed. A J-wire was kinked to measure appropriate catheter length. A Palindrome tunneled hemodialysis catheter measuring 19 cm from tip to cuff was chosen for placement. This was tunneled in a retrograde fashion from the chest wall to the venotomy incision. At the venotomy, serial dilatation was performed and a 16 Fr peel-away sheath was  placed over a guidewire. The catheter was then placed through the sheath and the sheath removed. Final catheter positioning was confirmed and documented with a fluoroscopic spot image. The catheter was aspirated, flushed with saline, and injected with appropriate volume heparin dwells. The venotomy incision was closed with subcutaneous subcuticular 4-0 Vicryl. Dermabond was applied to the incision. The catheter exit site was secured with 0-Prolene retention sutures. COMPLICATIONS: None.  No pneumothorax. FINDINGS: After catheter placement, the tip lies in the right atrium. The catheter aspirates normally and is ready for immediate use. IMPRESSION: Placement of tunneled hemodialysis catheter via the right internal jugular vein. The catheter tip lies in the right atrium. The catheter is ready for immediate use. Electronically Signed   By: Aletta Edouard M.D.   On: 10/26/2019 11:14   IR Fluoro Guide CV Line Right  Result Date: 10/25/2019 INDICATION: 86 year old male in need secondary to acute kidney injury. to acute kidney injury. 86 year old male in need of hemodialysis of hemodialysis secondary EXAM: TUNNELED CENTRAL VENOUS HEMODIALYSIS CATHETER PLACEMENT WITH ULTRASOUND AND FLUOROSCOPIC GUIDANCE MEDICATIONS: 2 g Ancef 2 g Ancef. The  antibiotic was given in an appropriate time interval prior to skin puncture. ANESTHESIA/SEDATION: Moderate (conscious) sedation was employed during this procedure. A total of Versed 1 mg and Fentanyl 25 mcg was administered intravenously. Moderate Sedation Time: 14 minutes. The patient's level of consciousness and vital signs were monitored continuously by radiology nursing throughout the procedure under my direct supervision. FLUOROSCOPY TIME:  Fluoroscopy Time: 0 minutes 36 seconds (3 mGy). COMPLICATIONS: None immediate. PROCEDURE: Informed written consent was obtained from the patient after a discussion of the risks, benefits, and alternatives to treatment. Questions regarding  the procedure were encouraged and answered. The right neck and chest were prepped with chlorhexidine in a sterile fashion, and a sterile drape was applied covering the operative field. Maximum barrier sterile technique with sterile gowns and gloves were used for the procedure. A timeout was performed prior to the initiation of the procedure. After creating a small venotomy incision, a micropuncture kit was utilized to access the right internal jugular vein under direct, real-time ultrasound guidance after the overlying soft tissues were anesthetized with 1% lidocaine with epinephrine. Ultrasound image documentation was performed. The microwire was kinked to measure appropriate catheter length. A stiff Glidewire was advanced to the level of the IVC and the micropuncture sheath was exchanged for a peel-away sheath. A palindrome tunneled hemodialysis catheter measuring 19 cm from tip to cuff was tunneled in a retrograde fashion from the anterior chest wall to the venotomy incision. The catheter was then placed through the peel-away sheath with tips ultimately positioned within the superior aspect of the right atrium. Final catheter positioning was confirmed and documented with a spot radiographic image. The catheter aspirates and flushes normally. The catheter was flushed with appropriate volume heparin dwells. The catheter exit site was secured with a 0-Prolene retention suture. The venotomy incision was closed with Dermabond. Dressings were applied. The patient tolerated the procedure well without immediate post procedural complication. IMPRESSION: Successful placement of 19 cm tip to cuff tunneled hemodialysis catheter via the right internal jugular vein with tips terminating within the superior aspect of the right atrium. The catheter is ready for immediate use. Electronically Signed   By: Jacqulynn Cadet M.D.   On: 10/25/2019 20:15   IR US Guide Vasc Access Right  Result Date: 10/26/2019 CLINICAL DATA:   Renal failure and status post placement of tunneled hemodialysis catheter yesterday with subsequent pulling and complete dislodgement of the catheter by the patient due to confusion and combativeness. He requires another catheter to be placed for emergent hemodialysis. EXAM: TUNNELED CENTRAL VENOUS HEMODIALYSIS CATHETER PLACEMENT WITH ULTRASOUND AND FLUOROSCOPIC GUIDANCE ANESTHESIA/SEDATION: 0.5 mg IV Versed; 25 mcg IV Fentanyl. Total Moderate Sedation Time:   19 minutes. The patient's level of consciousness and physiologic status were continuously monitored during the procedure by Radiology nursing. MEDICATIONS: 2 g IV Ancef. FLUOROSCOPY TIME:  24 seconds.  3.2 mGy. PROCEDURE: The procedure, risks, benefits, and alternatives were explained to the patient. Questions regarding the procedure were encouraged and answered. The patient understands and consents to the procedure. A timeout was performed prior to initiating the procedure. The right neck and chest were prepped with chlorhexidine in a sterile fashion, and a sterile drape was applied covering the operative field. Maximum barrier sterile technique with sterile gowns and gloves were used for the procedure. Local anesthesia was provided with 1% lidocaine. Ultrasound was used to confirm patency of the right internal jugular vein. After creating a small venotomy incision, a 21 gauge needle was advanced into the right internal  jugular vein under direct, real-time ultrasound guidance. Ultrasound image documentation was performed. After securing guidewire access, an 8 Fr dilator was placed. A J-wire was kinked to measure appropriate catheter length. A Palindrome tunneled hemodialysis catheter measuring 19 cm from tip to cuff was chosen for placement. This was tunneled in a retrograde fashion from the chest wall to the venotomy incision. At the venotomy, serial dilatation was performed and a 16 Fr peel-away sheath was placed over a guidewire. The catheter was then  placed through the sheath and the sheath removed. Final catheter positioning was confirmed and documented with a fluoroscopic spot image. The catheter was aspirated, flushed with saline, and injected with appropriate volume heparin dwells. The venotomy incision was closed with subcutaneous subcuticular 4-0 Vicryl. Dermabond was applied to the incision. The catheter exit site was secured with 0-Prolene retention sutures. COMPLICATIONS: None.  No pneumothorax. FINDINGS: After catheter placement, the tip lies in the right atrium. The catheter aspirates normally and is ready for immediate use. IMPRESSION: Placement of tunneled hemodialysis catheter via the right internal jugular vein. The catheter tip lies in the right atrium. The catheter is ready for immediate use. Electronically Signed   By: Aletta Edouard M.D.   On: 10/26/2019 11:14   IR US Guide Vasc Access Right  Result Date: 10/25/2019 INDICATION: 86 year old male in need secondary to acute kidney injury. to acute kidney injury. 86 year old male in need of hemodialysis of hemodialysis secondary EXAM: TUNNELED CENTRAL VENOUS HEMODIALYSIS CATHETER PLACEMENT WITH ULTRASOUND AND FLUOROSCOPIC GUIDANCE MEDICATIONS: 2 g Ancef 2 g Ancef. The antibiotic was given in an appropriate time interval prior to skin puncture. ANESTHESIA/SEDATION: Moderate (conscious) sedation was employed during this procedure. A total of Versed 1 mg and Fentanyl 25 mcg was administered intravenously. Moderate Sedation Time: 14 minutes. The patient's level of consciousness and vital signs were monitored continuously by radiology nursing throughout the procedure under my direct supervision. FLUOROSCOPY TIME:  Fluoroscopy Time: 0 minutes 36 seconds (3 mGy). COMPLICATIONS: None immediate. PROCEDURE: Informed written consent was obtained from the patient after a discussion of the risks, benefits, and alternatives to treatment. Questions regarding the procedure were encouraged and answered. The  right neck and chest were prepped with chlorhexidine in a sterile fashion, and a sterile drape was applied covering the operative field. Maximum barrier sterile technique with sterile gowns and gloves were used for the procedure. A timeout was performed prior to the initiation of the procedure. After creating a small venotomy incision, a micropuncture kit was utilized to access the right internal jugular vein under direct, real-time ultrasound guidance after the overlying soft tissues were anesthetized with 1% lidocaine with epinephrine. Ultrasound image documentation was performed. The microwire was kinked to measure appropriate catheter length. A stiff Glidewire was advanced to the level of the IVC and the micropuncture sheath was exchanged for a peel-away sheath. A palindrome tunneled hemodialysis catheter measuring 19 cm from tip to cuff was tunneled in a retrograde fashion from the anterior chest wall to the venotomy incision. The catheter was then placed through the peel-away sheath with tips ultimately positioned within the superior aspect of the right atrium. Final catheter positioning was confirmed and documented with a spot radiographic image. The catheter aspirates and flushes normally. The catheter was flushed with appropriate volume heparin dwells. The catheter exit site was secured with a 0-Prolene retention suture. The venotomy incision was closed with Dermabond. Dressings were applied. The patient tolerated the procedure well without immediate post procedural complication. IMPRESSION: Successful placement of 19  cm tip to cuff tunneled hemodialysis catheter via the right internal jugular vein with tips terminating within the superior aspect of the right atrium. The catheter is ready for immediate use. Electronically Signed   By: Jacqulynn Cadet M.D.   On: 10/25/2019 20:15   DG CHEST PORT 1 VIEW  Result Date: 10/25/2019 CLINICAL DATA:  Placement of hemodialysis catheter with subsequent removal  EXAM: PORTABLE CHEST 1 VIEW COMPARISON:  05/22/2017, 10/25/2019 FINDINGS: No central venous catheter identified on the current image. Enlarged cardiomediastinal silhouette with vascular congestion and bilateral interstitial and ground-glass opacity suspect for edema. Probable left pleural effusion. Aortic atherosclerosis. No pneumothorax. IMPRESSION: Mild cardiomegaly with vascular congestion, and diffuse interstitial and ground-glass opacity, likely pulmonary edema with probable left pleural effusion Electronically Signed   By: Donavan Foil M.D.   On: 10/25/2019 19:18   ECHOCARDIOGRAM COMPLETE  Result Date: 10/26/2019    ECHOCARDIOGRAM REPORT   Patient Name:   Benjamin Watkins Date of Exam: 10/26/2019 Medical Rec #:  [id number]         Height:       74.0 in Accession #:    [id number]        Weight:       222.2 lb Date of Birth:  12-Jun-1933         BSA:          2.273 m Patient Age:    37 years          BP:           98/53 mmHg Patient Gender: M                 HR:           99 bpm. Exam Location:  Inpatient Procedure: 2D Echo, Color Doppler, Cardiac Doppler and Intracardiac            Opacification Agent Indications:    I48.91* Unspecified atrial fibrillation  History:        Patient has no prior history of Echocardiogram examinations.                 Risk Factors:Hypertension and Diabetes.  Sonographer:    Raquel Sarna Senior RDCS Referring Phys: [place]  Sonographer Comments: Technically difficult due to poor echo windows. IMPRESSIONS  1. Left ventricular ejection fraction, by estimation, is 30 to 35%. The left ventricle has moderately decreased function. The left ventricle demonstrates regional wall motion abnormalities (see scoring diagram/findings for description). The left ventricular internal cavity size was moderately dilated. Left ventricular diastolic parameters are indeterminate. There is severe hypokinesis to akinesis of all apical segments, to the mid wall level. Function at the basal segments  is only mildly hypokinetic. This may be consistent with a stress induced (takotsubo) cardiomyopathy, though multivessel CAD cannot be excluded. LV thrombus excluded by contrast.  2. Right ventricular systolic function is mildly reduced. The right ventricular size is mildly enlarged. There is mildly elevated pulmonary artery systolic pressure.  3. Left atrial size was moderately dilated.  4. Right atrial size was moderately dilated.  5. The mitral valve is grossly normal. Mild mitral valve regurgitation. No evidence of mitral stenosis.  6. The aortic valve has an indeterminant number of cusps. Aortic valve regurgitation is trivial. Mild to moderate aortic valve sclerosis/calcification is present, without any evidence of aortic stenosis. Comparison(s): No prior Echocardiogram. FINDINGS  Left Ventricle: Left ventricular ejection fraction, by estimation, is 30 to 35%. The left ventricle has moderately decreased function.  The left ventricle demonstrates regional wall motion abnormalities. Definity contrast agent was given IV to delineate the left ventricular endocardial borders. The left ventricular internal cavity size was moderately dilated. There is no left ventricular hypertrophy. Left ventricular diastolic parameters are indeterminate.  LV Wall Scoring: The mid and distal anterior wall, mid and distal lateral wall, mid and distal anterior septum, entire apex, mid and distal inferior wall, mid anterolateral segment, and mid inferoseptal segment are akinetic. The basal anteroseptal segment, basal inferolateral segment, basal anterolateral segment, basal anterior segment, basal inferior segment, and basal inferoseptal segment are hypokinetic. There is severe hypokinesis to akinesis of all apical segments, to the mid wall level. Function at the basal segments is only mildly hypokinetic. This may be consistent with a stress induced (takotsubo) cardiomyopathy, though multivessel CAD cannot be excluded. LV thrombus  excluded by contrast. Right Ventricle: The right ventricular size is mildly enlarged. Right vetricular wall thickness was not assessed. Right ventricular systolic function is mildly reduced. There is mildly elevated pulmonary artery systolic pressure. The tricuspid regurgitant velocity is 2.57 m/s, and with an assumed right atrial pressure of 8 mmHg, the estimated right ventricular systolic pressure is 63.7 mmHg. Left Atrium: Left atrial size was moderately dilated. Right Atrium: Right atrial size was moderately dilated. Pericardium: Trivial pericardial effusion is present. Mitral Valve: The mitral valve is grossly normal. There is mild thickening of the mitral valve leaflet(s). There is mild calcification of the mitral valve leaflet(s). Mild mitral annular calcification. Mild mitral valve regurgitation. No evidence of mitral valve stenosis. Tricuspid Valve: The tricuspid valve is normal in structure. Tricuspid valve regurgitation is mild . No evidence of tricuspid stenosis. Aortic Valve: The aortic valve has an indeterminant number of cusps. . There is moderate thickening and moderate calcification of the aortic valve. Aortic valve regurgitation is trivial. Mild to moderate aortic valve sclerosis/calcification is present, without any evidence of aortic stenosis. There is moderate thickening of the aortic valve. There is moderate calcification of the aortic valve. Pulmonic Valve: The pulmonic valve was not well visualized. Pulmonic valve regurgitation is not visualized. No evidence of pulmonic stenosis. Aorta: The aortic root and ascending aorta are structurally normal, with no evidence of dilitation. Pulmonary Artery: The pulmonary artery is not well seen. Venous: The inferior vena cava was not well visualized. IAS/Shunts: No atrial level shunt detected by color flow Doppler.  LEFT VENTRICLE PLAX 2D LVIDd:         5.40 cm  Diastology LVIDs:         4.30 cm  LV e' lateral: 7.18 cm/s LV PW:         1.10 cm  LV e'  medial:  2.50 cm/s LV IVS:        1.10 cm LVOT diam:     2.10 cm LV SV:         53 LV SV Index:   23 LVOT Area:     3.46 cm  RIGHT VENTRICLE RV S prime:     7.62 cm/s TAPSE (M-mode): 1.6 cm LEFT ATRIUM              Index       RIGHT ATRIUM           Index LA diam:        3.60 cm  1.58 cm/m  RA Area:     28.10 cm LA Vol (A2C):   94.2 ml  41.44 ml/m RA Volume:   95.30 ml  41.93 ml/m LA Vol (  A4C):   102.0 ml 44.87 ml/m LA Biplane Vol: 102.0 ml 44.87 ml/m  AORTIC VALVE LVOT Vmax:   77.40 cm/s LVOT Vmean:  56.700 cm/s LVOT VTI:    0.153 m  AORTA Ao Root diam: 3.60 cm TRICUSPID VALVE TR Peak grad:   26.4 mmHg TR Vmax:        257.00 cm/s  SHUNTS Systemic VTI:  0.15 m Systemic Diam: 2.10 cm Buford Dresser MD Electronically signed by Buford Dresser MD Signature Date/Time: 10/26/2019/3:28:10 PM    Final     Microbiology: No results found for this or any previous visit (from the past 240 hour(s)).   Labs: Basic Metabolic Panel: Recent Labs  Lab 10/26/19 0508 10/26/19 1436 10/28/19 0702 10/28/19 1641 10/29/19 0724 10/30/19 0601 10/31/19 0356  NA 142   < > 142 142 141 141 141  K 6.5*   < > 7.1* 6.0* 5.1 4.4 5.5*  CL 112*   < > 103 105 102 105 103  CO2 15*   < > 23 24 24 26 23   GLUCOSE 171*   < > 258* 273* 308* 147* 94  BUN 141*   < > 134* 81* 104* 72* 95*  CREATININE 8.30*   < > 7.40* 4.75* 5.66* 4.33* 5.63*  CALCIUM 7.0*   < > 7.1* 7.0* 7.2* 7.2* 7.2*  MG 2.1  --   --   --   --   --   --   PHOS 6.6*   < > 6.9* 4.8* 6.2* 5.0* 6.8*   < > = values in this interval not displayed.   Liver Function Tests: Recent Labs  Lab 10/28/19 0702 10/28/19 1641 10/29/19 0724 10/30/19 0601 10/31/19 0356  ALBUMIN 2.5* 2.4* 2.6* 2.3* 2.4*   No results for input(s): LIPASE, AMYLASE in the last 168 hours. No results for input(s): AMMONIA in the last 168 hours. CBC: Recent Labs  Lab 10/28/19 0702 10/28/19 0702 10/28/19 1641 10/28/19 1641 10/29/19 0905 10/29/19 1528 10/29/19 2342  10/30/19 0601 10/31/19 0356  WBC 175.3*  --  161.1*  --  169.3*  --   --  174.0* 183.2*  HGB 7.2*   < > 6.4*   < > 7.1* 6.5* 7.7* 7.3* 7.0*  HCT 23.4*   < > 20.8*   < > 23.2* 21.1* 24.7* 23.3* 23.5*  MCV 103.5*  --  101.5*  --  103.1*  --   --  98.3 100.0  PLT 92*  --  79*  --  74*  --   --  65* 71*   < > = values in this interval not displayed.   Cardiac Enzymes: No results for input(s): CKTOTAL, CKMB, CKMBINDEX, TROPONINI in the last 168 hours. BNP: BNP (last 3 results) No results for input(s): BNP in the last 8760 hours.  ProBNP (last 3 results) No results for input(s): PROBNP in the last 8760 hours.  CBG: Recent Labs  Lab 10/30/19 2352 10/31/19 0317 10/31/19 0615 10/31/19 0750 10/31/19 1119  GLUCAP 140* 78 93 91 131*   Signed:  Domenic Polite MD.  [facility] 11/01/2019, 1:46 PM

## 2019-11-01 NOTE — TOC Progression Note (Signed)
Transition of Care [facility]) - Progression Note    Patient Details  Name: Benjamin Watkins MRN: [id number] Date of Birth: [birth year]  Transition of Care [facility]) CM/SW [place], [place] Phone Number: 11/01/2019, 10:31 AM  Clinical Narrative:     CSW spoke with Floris from [facility]. She will call [name] patients son and call CSW back with discharge plans for patient.  CSW will continue to follow.    Expected Discharge Plan: [place] Barriers to Discharge: Continued Medical Work up  Expected Discharge Plan and Services Expected Discharge Plan: [place]                                               Social Determinants of Health (SDOH) Interventions    Readmission Risk Interventions No flowsheet data found.

## 2019-11-01 NOTE — Progress Notes (Signed)
Brief nephrology note: Patient did not tolerate dialysis yesterday limited by hypotension and unable to UF. Discussed with the patient's wife, son, oncology team and primary team yesterday.  Patient is now DNR, comfort care and probably discharge to residential hospice.  No further dialysis. Sign off, please call back with question.  Discussed with Dr. Broadus John.

## 2019-11-01 NOTE — TOC Transition Note (Signed)
Transition of Care [facility]) - CM/SW Discharge Note   Patient Details  Name: Benjamin Watkins MRN: [id number] Date of Birth: 1933-01-21  Transition of Care [facility]) CM/SW Contact:  Trula Ore, [place] Phone Number: 11/01/2019, 2:04 PM   Clinical Narrative:      Patient will DC to: [place]  Anticipated DC date: 11/01/2019  Family notified: [name]  Transport by: Corey Harold  ?  Per MD patient ready for DC to [place] . RN, patient, patient's family, facility notified of DC.  RN given number for report [phone]. DC packet on chart. Ambulance transport requested for patient.  CSW signing off.   Final next level of care: [place] Barriers to Discharge: Continued Medical Work up   Patient Goals and CMS Choice Patient states their goals for this hospitalization and ongoing recovery are:: to go to [facility].gov Compare Post Acute Care list provided to:: Patient Represenative (must comment)(son [name]) Choice offered to / list presented to : Adult Children([place])  Discharge Placement              [facility] Patient to be transferred to facility by: [place] Name of family member notified: [name] Patient and family notified of of transfer: 11/01/19  Discharge Plan and Services                                     Social Determinants of Health (SDOH) Interventions     Readmission Risk Interventions No flowsheet data found.

## 2019-11-01 NOTE — Progress Notes (Signed)
Progress Note  Patient Name: Benjamin Watkins Date of Encounter: 11/01/2019  Primary Cardiologist: Mertie Moores, MD   Subjective   Denies pain. Full comfort care. On Fentanyl gtt.   Inpatient Medications    Scheduled Meds: . antiseptic oral rinse  15 mL Mouth Rinse BID  . glycopyrrolate  0.4 mg Intravenous Q4H   Continuous Infusions: . fentaNYL infusion INTRAVENOUS 50 mcg/hr (11/01/19 0700)   PRN Meds: acetaminophen **OR** acetaminophen, famotidine, fentaNYL (SUBLIMAZE) injection, LORazepam, ondansetron **OR** ondansetron (ZOFRAN) IV, polyvinyl alcohol   Vital Signs    Vitals:   10/31/19 1614 10/31/19 1636 10/31/19 1925 11/01/19 0625  BP: (!) 97/40 (!) 108/48  (!) 104/51  Pulse: 80 73  86  Resp: 19 16 17  (!) 22  Temp: 98.2 F (36.8 C) 97.8 F (36.6 C)  (!) 97.5 F (36.4 C)  TempSrc: Axillary Oral  Oral  SpO2: 100% 100%    Weight:      Height:        Intake/Output Summary (Last 24 hours) at 11/01/2019 0735 Last data filed at 11/01/2019 0600 Gross per 24 hour  Intake 1058.12 ml  Output -115 ml  Net 1173.12 ml   Filed Weights   10/30/19 0523 10/31/19 0412 10/31/19 1330  Weight: 107 kg 107.5 kg 104.2 kg    Physical Exam   General: Elderly, NAD Neck: Negative for carotid bruits. No JVD Lungs: Rhnochi throughout lund fields. Breathing is mildly labored. Cardiovascular: RRR with S1 S2. No murmurs Abdomen: Soft, non-tender, non-distended. No obvious abdominal masses. Extremities: 2+ BLE edema. Radial pulses 2+ bilaterally Neuro: Alert and oriented to person and place. No focal deficits. No facial asymmetry. MAE spontaneously. Psych: Responds to questions appropriately with normal affect.    Labs    Chemistry Recent Labs  Lab 10/29/19 0724 10/30/19 0601 10/31/19 0356  NA 141 141 141  K 5.1 4.4 5.5*  CL 102 105 103  CO2 24 26 23   GLUCOSE 308* 147* 94  BUN 104* 72* 95*  CREATININE 5.66* 4.33* 5.63*  CALCIUM 7.2* 7.2* 7.2*  ALBUMIN 2.6* 2.3* 2.4*   GFRNONAA 8* 12* 9*  GFRAA 10* 14* 10*  ANIONGAP 15 10 15      Hematology Recent Labs  Lab 10/29/19 0905 10/29/19 1528 10/29/19 2342 10/30/19 0601 10/31/19 0356  WBC 169.3*  --   --  174.0* 183.2*  RBC 2.25*  --   --  2.37* 2.35*  HGB 7.1*   < > 7.7* 7.3* 7.0*  HCT 23.2*   < > 24.7* 23.3* 23.5*  MCV 103.1*  --   --  98.3 100.0  MCH 31.6  --   --  30.8 29.8  MCHC 30.6  --   --  31.3 29.8*  RDW 19.0*  --   --  20.0* 20.1*  PLT 74*  --   --  65* 71*   < > = values in this interval not displayed.    Cardiac EnzymesNo results for input(s): TROPONINI in the last 168 hours. No results for input(s): TROPIPOC in the last 168 hours.   BNPNo results for input(s): BNP, PROBNP in the last 168 hours.   DDimer No results for input(s): DDIMER in the last 168 hours.   Radiology    No results found.  Telemetry    11/01/19 NSR with rates in the 90's - Personally Reviewed  ECG    No new tracing as of 11/01/19- Personally Reviewed  Cardiac Studies   Echocardiogram 10/26/2019: Impressions: 1.  Left ventricular ejection fraction, by estimation, is 30 to 35%. The  left ventricle has moderately decreased function. The left ventricle  demonstrates regional wall motion abnormalities (see scoring  diagram/findings for description). The left  ventricular internal cavity size was moderately dilated. Left ventricular  diastolic parameters are indeterminate. There is severe hypokinesis to  akinesis of all apical segments, to the mid wall level. Function at the  basal segments is only mildly  hypokinetic. This may be consistent with a stress induced (takotsubo)  cardiomyopathy, though multivessel CAD cannot be excluded. LV thrombus  excluded by contrast.  2. Right ventricular systolic function is mildly reduced. The right  ventricular size is mildly enlarged. There is mildly elevated pulmonary  artery systolic pressure.  3. Left atrial size was moderately dilated.  4. Right atrial size was  moderately dilated.  5. The mitral valve is grossly normal. Mild mitral valve regurgitation.  No evidence of mitral stenosis.  6. The aortic valve has an indeterminant number of cusps. Aortic valve  regurgitation is trivial. Mild to moderate aortic valve  sclerosis/calcification is present, without any evidence of aortic  stenosis.   Comparison(s): No prior Echocardiogram.   Patient Profile     86 y.o. male with a history of hypertension, diabetes mellitus, chronic anemia,and CKDwho is being seen today for the evaluation of new onset atrial fibrillationat the request of Dr.Pahwani.  Assessment & Plan    1.  New onset paroxysmal atrial fibrillation:  -Initially on heparin however this was stopped secondary to anemia requiring blood transfusion -Remains in NSR with rates in the 90's -Transitioned to p.o. amiodarone 400 mg twice daily on 10/31/2019 -Pt now full comfort care>>>medications discontinued>>on Fentanyl gtt  2.  Acute on chronic combined CHF: -LVEF 30 to 35% with questionable stress-induced cardiomyopathy versus CAD -Anticipate guideline directed medical therapy once creatinine improves -BP to soft to add BB or hydralazine -Creatinine 5.63 on 10/31/2019  3.  CLL: -Management per oncology  4. Chronic Anemia -Likely due to CLL and potentially kidney disease  5.  DM2:  -Management per primary team -SSI for glucose control inpatient status  6.  Acute on chronic CKD stage V: -Creatinine, 5.63>>no labs today>>full comfort care  7.  Goals of care: -Palliative care consulted -Patient now DNR>> does not wish for long-term HD.  Plan at this time per chart review is possible referral to residential hospice homes -Full comfort care    Signed, Kathyrn Drown NP-C [place] Pager: [phone] 11/01/2019, 7:35 AM     For questions or updates, please contact   Please consult [email or web address] for contact info under Cardiology/STEMI.

## 2019-11-01 NOTE — TOC Initial Note (Signed)
Transition of Care [facility]) - Initial/Assessment Note    Patient Details  Name: Benjamin Watkins MRN: [id number] Date of Birth: 22-Jan-1933  Transition of Care [facility]) CM/SW Contact:    Trula Ore, [place] Phone Number: 11/01/2019, 10:19 AM  Clinical Narrative:                  Update: Son Benjamin Watkins called CSW back with Hospice Choice. He chose [facility] [place]. CSW let patients son know that she is going to call [facility] and give him a call back as soon as I speak with them with information. Patients son Benjamin Watkins let CSW know to list him as primary contact for patient. His mom Benjamin Watkins has dementia. CSW let patients son Benjamin Watkins know we will update patients contact information.  CSW will continue to follow.  CSW spoke with patients wife Benjamin Watkins by phone. CSW offered Hospice choice to patients wife. She is going to discuss it with her son and call CSW back with choice.   Expected Discharge Plan: [place] Barriers to Discharge: Continued Medical Work up   Patient Goals and CMS Choice Patient states their goals for this hospitalization and ongoing recovery are:: to go to hospice facility CMS Medicare.gov Compare Post Acute Care list provided to:: Patient Represenative (must comment)(spouse Benjamin Watkins) Choice offered to / list presented to : Spouse  Expected Discharge Plan and Services Expected Discharge Plan: [place]                                              Prior Living Arrangements/Services   Lives with:: Spouse Patient language and need for interpreter reviewed:: Yes Do you feel safe going back to the place where you live?: No      Need for Family Participation in Patient Care: Yes (Comment) Care giver support system in place?: Yes (comment)   Criminal Activity/Legal Involvement Pertinent to Current Situation/Hospitalization: No - Comment as needed  Activities of Daily Living Home Assistive Devices/Equipment: Cane (specify  quad or straight), CBG Meter, Eyeglasses(single point cane) ADL Screening (condition at time of admission) Patient's cognitive ability adequate to safely complete daily activities?: Yes Is the patient deaf or have difficulty hearing?: Yes(hoh) Does the patient have difficulty seeing, even when wearing glasses/contacts?: No Does the patient have difficulty concentrating, remembering, or making decisions?: No Patient able to express need for assistance with ADLs?: Yes Does the patient have difficulty dressing or bathing?: Yes Independently performs ADLs?: No Communication: Independent Dressing (OT): Independent Grooming: Independent Feeding: Independent Bathing: Independent Toileting: Needs assistance Is this a change from baseline?: Change from baseline, expected to last >3days In/Out Bed: Needs assistance Is this a change from baseline?: Change from baseline, expected to last >3 days Walks in Home: Needs assistance Is this a change from baseline?: Change from baseline, expected to last >3 days Does the patient have difficulty walking or climbing stairs?: Yes(secondary to weakness) Weakness of Legs: Both Weakness of Arms/Hands: None  Permission Sought/Granted Permission sought to share information with : Case Manager, Family Supports, Customer service manager Permission granted to share information with : Yes, Verbal Permission Granted  Share Information with NAME: Benjamin Watkins  Permission granted to share info w AGENCY: [facility]  Permission granted to share info w Relationship: Spouse  Permission granted to share info w Contact Information: Benjamin Watkins  Emotional Assessment Appearance::  Appears stated age Attitude/Demeanor/Rapport: Engaged Affect (typically observed): Stable Orientation: : Oriented to Self, Oriented to Place Alcohol / Substance Use: Not Applicable Psych Involvement: No (comment)  Admission diagnosis:  AKI (acute kidney injury) ([place])  [N17.9] Symptomatic anemia [D64.9] Patient Active Problem List   Diagnosis Date Noted  . Palliative care by specialist   . Goals of care, counseling/discussion   . DNR (do not resuscitate)   . Terminal care   . Dysphagia   . Pressure injury of skin 10/27/2019  . Malnutrition of moderate degree 10/26/2019  . AKI (acute kidney injury) ([place]) 10/18/2019  . Dehydration 10/18/2019  . CLL (chronic lymphocytic leukemia) ([place]) 10/18/2019  . Anemia 10/18/2019  . DM (diabetes mellitus), type 2 ([place]) 10/18/2019  . Gait abnormality 04/05/2018  . Right foot drop 02/05/2018   PCP:  Benjamin Neer, MD Pharmacy:   [place], [place] [address] [place] [address] Phone: [phone] Fax: [place], [place] [address] [place] [address] Phone: [phone] Fax: [phone]     Social Determinants of Health (SDOH) Interventions    Readmission Risk Interventions No flowsheet data found.

## 2019-11-01 NOTE — Progress Notes (Addendum)
  Report called in to [place] [facility] . Fentanyl drip solution discontinued per MD , transferring to [facility] . Unused fentanyl bag  At 210 cc leftover  discarded in the stericycle witnessed by Lisette Abu RN . HD cath, IV access x 2 sites and Foley catheter left intact as is per [facility] request. PTAR in to transport pt to hospice place.

## 2019-11-04 ENCOUNTER — Inpatient Hospital Stay: Payer: Medicare Other | Admitting: Hematology

## 2019-11-04 ENCOUNTER — Inpatient Hospital Stay: Payer: Medicare Other

## 2019-11-26 DEATH — deceased

## 2021-05-06 IMAGING — DX DG CHEST 1V PORT
1 series · 1 of 1 positions shown · non-contrast
Comparison: 05/22/2017, 10/25/2019

CLINICAL DATA: Placement of hemodialysis catheter with subsequent
removal

EXAM:
PORTABLE CHEST 1 VIEW

[chest ap]
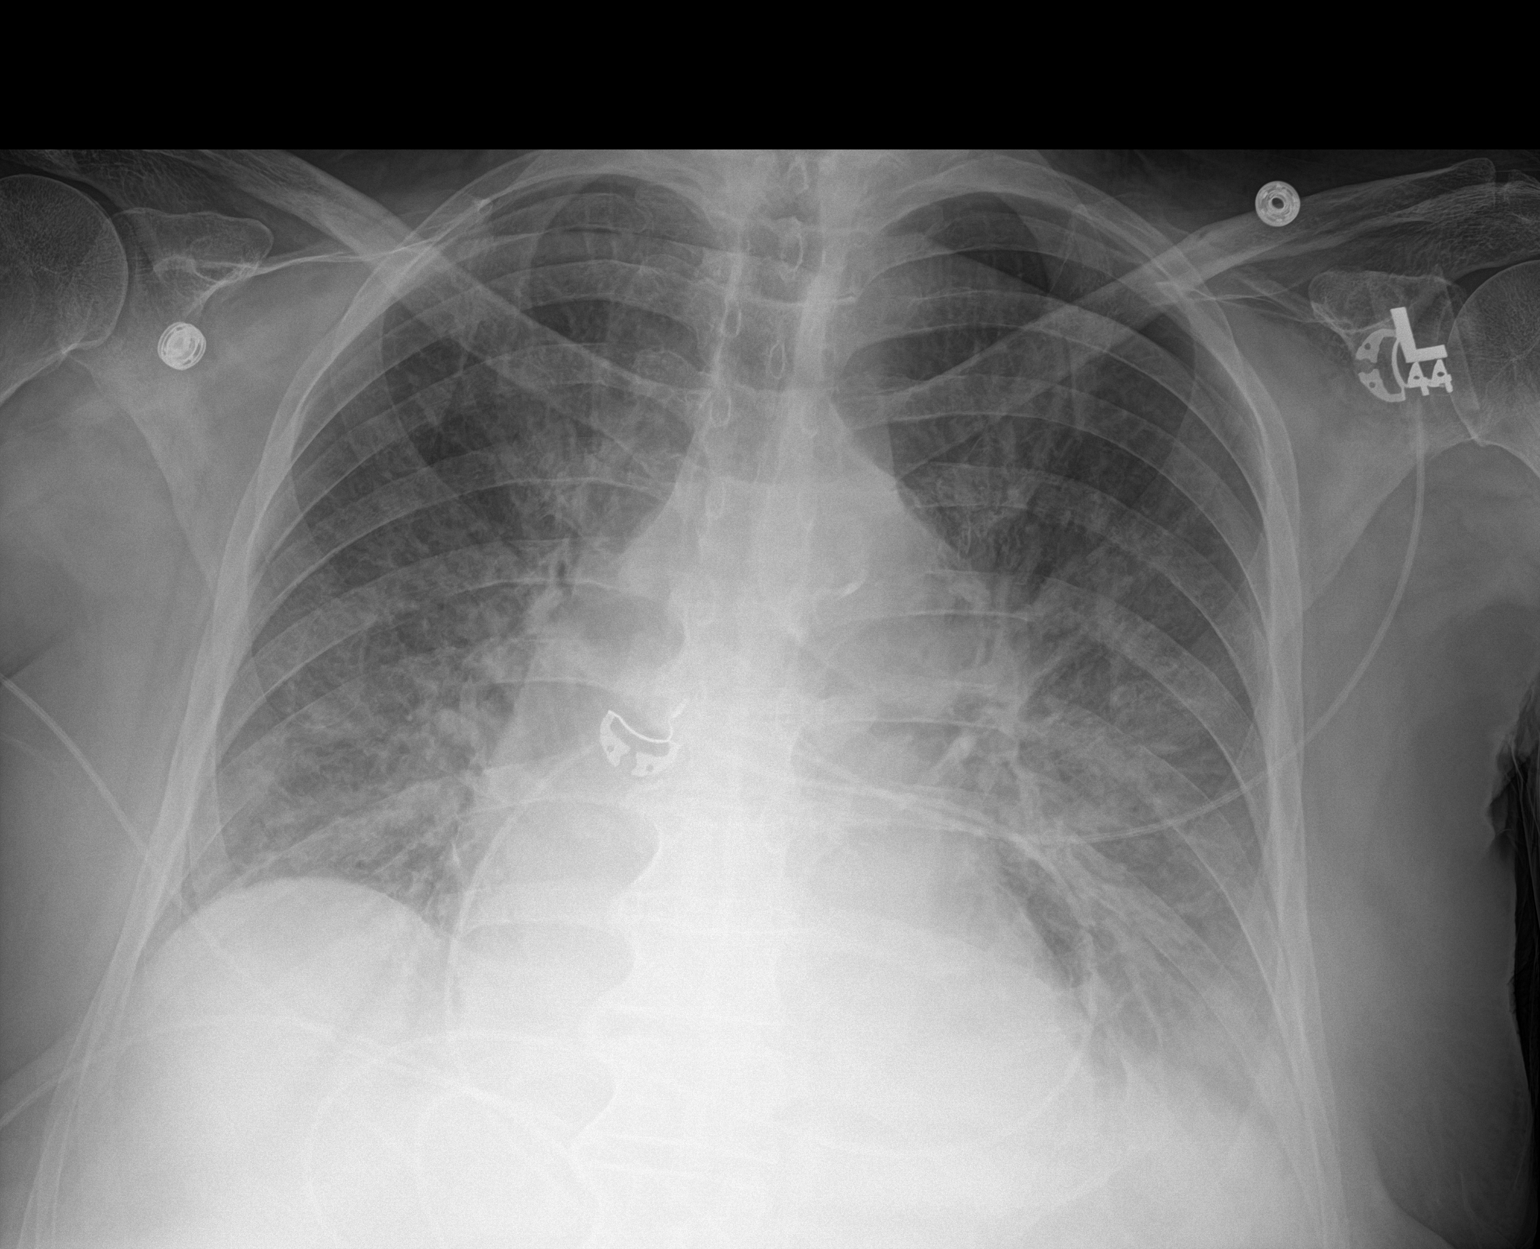

[1 of 1 positions shown; findings below may reference images not displayed]

FINDINGS: No central venous catheter identified on the current image. Enlarged
cardiomediastinal silhouette with vascular congestion and bilateral
interstitial and ground-glass opacity suspect for edema. Probable
left pleural effusion. Aortic atherosclerosis. No pneumothorax.
IMPRESSION: Mild cardiomegaly with vascular congestion, and diffuse interstitial
and ground-glass opacity, likely pulmonary edema with probable left
pleural effusion
